# Patient Record
Sex: Male | Born: 1955 | Race: Black or African American | Hispanic: No | Smoking: Never smoker
Health system: Southern US, Community
[De-identification: ages and names within clinical notes are randomized; demographics above are authoritative.]

## PROBLEM LIST (undated history)

## (undated) DIAGNOSIS — K219 Gastro-esophageal reflux disease without esophagitis: Secondary | ICD-10-CM

## (undated) DIAGNOSIS — I83813 Varicose veins of bilateral lower extremities with pain: Secondary | ICD-10-CM

## (undated) DIAGNOSIS — K5790 Diverticulosis of intestine, part unspecified, without perforation or abscess without bleeding: Secondary | ICD-10-CM

## (undated) DIAGNOSIS — U071 COVID-19: Secondary | ICD-10-CM

## (undated) DIAGNOSIS — M1990 Unspecified osteoarthritis, unspecified site: Secondary | ICD-10-CM

## (undated) DIAGNOSIS — R05 Cough: Secondary | ICD-10-CM

## (undated) DIAGNOSIS — K449 Diaphragmatic hernia without obstruction or gangrene: Secondary | ICD-10-CM

## (undated) DIAGNOSIS — E785 Hyperlipidemia, unspecified: Secondary | ICD-10-CM

## (undated) DIAGNOSIS — K635 Polyp of colon: Secondary | ICD-10-CM

## (undated) DIAGNOSIS — D649 Anemia, unspecified: Secondary | ICD-10-CM

## (undated) DIAGNOSIS — G4730 Sleep apnea, unspecified: Secondary | ICD-10-CM

## (undated) DIAGNOSIS — I10 Essential (primary) hypertension: Secondary | ICD-10-CM

## (undated) DIAGNOSIS — T7840XA Allergy, unspecified, initial encounter: Secondary | ICD-10-CM

## (undated) DIAGNOSIS — J45909 Unspecified asthma, uncomplicated: Secondary | ICD-10-CM

## (undated) DIAGNOSIS — R12 Heartburn: Secondary | ICD-10-CM

## (undated) DIAGNOSIS — J302 Other seasonal allergic rhinitis: Secondary | ICD-10-CM

## (undated) DIAGNOSIS — J4520 Mild intermittent asthma, uncomplicated: Secondary | ICD-10-CM

## (undated) DIAGNOSIS — G4750 Parasomnia, unspecified: Secondary | ICD-10-CM

## (undated) HISTORY — DX: Hyperlipidemia, unspecified: E78.5

## (undated) HISTORY — PX: POLYPECTOMY: SHX149

## (undated) HISTORY — DX: Sleep apnea, unspecified: G47.30

## (undated) HISTORY — DX: Varicose veins of bilateral lower extremities with pain: I83.813

## (undated) HISTORY — DX: Allergy, unspecified, initial encounter: T78.40XA

## (undated) HISTORY — DX: Parasomnia, unspecified: G47.50

## (undated) HISTORY — DX: Diaphragmatic hernia without obstruction or gangrene: K44.9

## (undated) HISTORY — DX: Diverticulosis of intestine, part unspecified, without perforation or abscess without bleeding: K57.90

## (undated) HISTORY — PX: ABDOMINAL SURGERY: SHX537

## (undated) HISTORY — DX: Gastro-esophageal reflux disease without esophagitis: K21.9

## (undated) HISTORY — DX: Polyp of colon: K63.5

## (undated) HISTORY — DX: Other seasonal allergic rhinitis: J30.2

## (undated) HISTORY — DX: Heartburn: R12

## (undated) HISTORY — DX: Anemia, unspecified: D64.9

## (undated) HISTORY — DX: Essential (primary) hypertension: I10

## (undated) HISTORY — DX: Mild intermittent asthma, uncomplicated: J45.20

## (undated) HISTORY — DX: Unspecified asthma, uncomplicated: J45.909

---
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
## 1999-12-23 ENCOUNTER — Emergency Department (HOSPITAL_COMMUNITY): Admission: EM | Admit: 1999-12-23 | Discharge: 1999-12-24 | Payer: Self-pay | Admitting: Emergency Medicine

## 1999-12-24 ENCOUNTER — Encounter: Payer: Self-pay | Admitting: Emergency Medicine

## 2000-01-01 ENCOUNTER — Emergency Department (HOSPITAL_COMMUNITY): Admission: EM | Admit: 2000-01-01 | Discharge: 2000-01-01 | Payer: Self-pay | Admitting: Emergency Medicine

## 2000-07-09 ENCOUNTER — Emergency Department (HOSPITAL_COMMUNITY): Admission: EM | Admit: 2000-07-09 | Discharge: 2000-07-09 | Payer: Self-pay | Admitting: Emergency Medicine

## 2000-09-02 ENCOUNTER — Encounter: Admission: RE | Admit: 2000-09-02 | Discharge: 2000-09-02 | Payer: Self-pay | Admitting: Internal Medicine

## 2000-09-09 ENCOUNTER — Encounter: Admission: RE | Admit: 2000-09-09 | Discharge: 2000-09-09 | Payer: Self-pay | Admitting: Internal Medicine

## 2000-09-20 ENCOUNTER — Encounter: Payer: Self-pay | Admitting: Emergency Medicine

## 2000-09-20 ENCOUNTER — Emergency Department (HOSPITAL_COMMUNITY): Admission: EM | Admit: 2000-09-20 | Discharge: 2000-09-20 | Payer: Self-pay | Admitting: Emergency Medicine

## 2000-09-20 ENCOUNTER — Ambulatory Visit (HOSPITAL_COMMUNITY): Admission: RE | Admit: 2000-09-20 | Discharge: 2000-09-20 | Payer: Self-pay | Admitting: Emergency Medicine

## 2001-03-30 ENCOUNTER — Emergency Department (HOSPITAL_COMMUNITY): Admission: EM | Admit: 2001-03-30 | Discharge: 2001-03-30 | Payer: Self-pay | Admitting: Emergency Medicine

## 2001-03-31 ENCOUNTER — Emergency Department (HOSPITAL_COMMUNITY): Admission: EM | Admit: 2001-03-31 | Discharge: 2001-04-01 | Payer: Self-pay | Admitting: Emergency Medicine

## 2001-04-01 ENCOUNTER — Emergency Department (HOSPITAL_COMMUNITY): Admission: EM | Admit: 2001-04-01 | Discharge: 2001-04-01 | Payer: Self-pay

## 2001-04-02 ENCOUNTER — Emergency Department (HOSPITAL_COMMUNITY): Admission: EM | Admit: 2001-04-02 | Discharge: 2001-04-02 | Payer: Self-pay | Admitting: Emergency Medicine

## 2001-04-07 ENCOUNTER — Emergency Department (HOSPITAL_COMMUNITY): Admission: EM | Admit: 2001-04-07 | Discharge: 2001-04-07 | Payer: Self-pay | Admitting: Emergency Medicine

## 2003-05-01 ENCOUNTER — Emergency Department (HOSPITAL_COMMUNITY): Admission: EM | Admit: 2003-05-01 | Discharge: 2003-05-01 | Payer: Self-pay | Admitting: Emergency Medicine

## 2005-06-08 ENCOUNTER — Emergency Department (HOSPITAL_COMMUNITY): Admission: EM | Admit: 2005-06-08 | Discharge: 2005-06-08 | Payer: Self-pay | Admitting: Emergency Medicine

## 2006-09-25 ENCOUNTER — Emergency Department (HOSPITAL_COMMUNITY): Admission: EM | Admit: 2006-09-25 | Discharge: 2006-09-26 | Payer: Self-pay | Admitting: Emergency Medicine

## 2006-09-26 ENCOUNTER — Ambulatory Visit: Payer: Self-pay | Admitting: Vascular Surgery

## 2006-09-26 ENCOUNTER — Ambulatory Visit (HOSPITAL_COMMUNITY): Admission: RE | Admit: 2006-09-26 | Discharge: 2006-09-26 | Payer: Self-pay | Admitting: Emergency Medicine

## 2006-09-26 ENCOUNTER — Encounter (INDEPENDENT_AMBULATORY_CARE_PROVIDER_SITE_OTHER): Payer: Self-pay | Admitting: Emergency Medicine

## 2007-01-09 ENCOUNTER — Emergency Department (HOSPITAL_COMMUNITY): Admission: EM | Admit: 2007-01-09 | Discharge: 2007-01-09 | Payer: Self-pay | Admitting: Emergency Medicine

## 2007-10-14 ENCOUNTER — Emergency Department (HOSPITAL_COMMUNITY): Admission: EM | Admit: 2007-10-14 | Discharge: 2007-10-14 | Payer: Self-pay | Admitting: Emergency Medicine

## 2008-04-25 ENCOUNTER — Emergency Department (HOSPITAL_COMMUNITY): Admission: EM | Admit: 2008-04-25 | Discharge: 2008-04-25 | Payer: Self-pay | Admitting: Emergency Medicine

## 2008-06-24 ENCOUNTER — Encounter (INDEPENDENT_AMBULATORY_CARE_PROVIDER_SITE_OTHER): Payer: Self-pay | Admitting: Emergency Medicine

## 2008-06-24 ENCOUNTER — Ambulatory Visit: Payer: Self-pay | Admitting: Surgery

## 2008-06-24 ENCOUNTER — Emergency Department (HOSPITAL_COMMUNITY): Admission: EM | Admit: 2008-06-24 | Discharge: 2008-06-24 | Payer: Self-pay | Admitting: Emergency Medicine

## 2008-11-08 ENCOUNTER — Ambulatory Visit: Payer: Self-pay | Admitting: Vascular Surgery

## 2008-11-10 ENCOUNTER — Emergency Department (HOSPITAL_COMMUNITY): Admission: EM | Admit: 2008-11-10 | Discharge: 2008-11-10 | Payer: Self-pay | Admitting: Family Medicine

## 2009-03-29 ENCOUNTER — Emergency Department (HOSPITAL_COMMUNITY): Admission: EM | Admit: 2009-03-29 | Discharge: 2009-03-29 | Payer: Self-pay | Admitting: Emergency Medicine

## 2009-06-23 ENCOUNTER — Emergency Department (HOSPITAL_COMMUNITY): Admission: EM | Admit: 2009-06-23 | Discharge: 2009-06-23 | Payer: Self-pay | Admitting: Family Medicine

## 2009-07-05 ENCOUNTER — Ambulatory Visit: Payer: Self-pay | Admitting: Internal Medicine

## 2009-07-05 DIAGNOSIS — E785 Hyperlipidemia, unspecified: Secondary | ICD-10-CM

## 2009-07-05 DIAGNOSIS — I8390 Asymptomatic varicose veins of unspecified lower extremity: Secondary | ICD-10-CM

## 2009-07-05 DIAGNOSIS — I10 Essential (primary) hypertension: Secondary | ICD-10-CM

## 2009-07-05 LAB — CONVERTED CEMR LAB
ALT: 77 units/L — ABNORMAL HIGH (ref 0–53)
Albumin: 4.4 g/dL (ref 3.5–5.2)
Chloride: 108 meq/L (ref 96–112)
Cholesterol: 228 mg/dL — ABNORMAL HIGH (ref 0–200)
HDL: 44 mg/dL (ref >39)
LDL Cholesterol: 159 mg/dL — ABNORMAL HIGH (ref 0–99)
Potassium: 4 meq/L (ref 3.5–5.3)
Sodium: 143 meq/L (ref 135–145)
Total CHOL/HDL Ratio: 5.2
Total Protein: 7.5 g/dL (ref 6.0–8.3)
Triglycerides: 125 mg/dL (ref <150)
VLDL: 25 mg/dL (ref 0–40)

## 2009-08-04 ENCOUNTER — Ambulatory Visit: Payer: Self-pay | Admitting: Internal Medicine

## 2009-08-04 ENCOUNTER — Telehealth: Payer: Self-pay | Admitting: *Deleted

## 2009-08-04 LAB — CONVERTED CEMR LAB
BUN: 18 mg/dL (ref 6–23)
Chloride: 105 meq/L (ref 96–112)
Potassium: 3.9 meq/L (ref 3.5–5.3)
Sodium: 139 meq/L (ref 135–145)

## 2009-08-29 ENCOUNTER — Ambulatory Visit: Payer: Self-pay | Admitting: Internal Medicine

## 2009-08-29 LAB — CONVERTED CEMR LAB

## 2009-10-03 ENCOUNTER — Ambulatory Visit: Payer: Self-pay | Admitting: Vascular Surgery

## 2009-10-17 ENCOUNTER — Telehealth: Payer: Self-pay | Admitting: Internal Medicine

## 2009-10-18 ENCOUNTER — Encounter: Payer: Self-pay | Admitting: Licensed Clinical Social Worker

## 2009-10-18 ENCOUNTER — Ambulatory Visit: Payer: Self-pay | Admitting: Internal Medicine

## 2009-10-18 DIAGNOSIS — K920 Hematemesis: Secondary | ICD-10-CM | POA: Insufficient documentation

## 2009-10-18 LAB — CONVERTED CEMR LAB
Eosinophils Relative: 3 % (ref 0–5)
HCT: 40.4 % (ref 39.0–52.0)
Hemoglobin: 13.5 g/dL (ref 13.0–17.0)
Lymphocytes Relative: 37 % (ref 12–46)
Lymphs Abs: 2 10*3/uL (ref 0.7–4.0)
Monocytes Relative: 10 % (ref 3–12)
Platelets: 222 10*3/uL (ref 150–400)
RBC: 4.47 M/uL (ref 4.22–5.81)
WBC: 5.4 10*3/uL (ref 4.0–10.5)

## 2009-11-26 ENCOUNTER — Emergency Department (HOSPITAL_COMMUNITY): Admission: EM | Admit: 2009-11-26 | Discharge: 2009-11-27 | Payer: Self-pay | Admitting: Emergency Medicine

## 2009-12-14 ENCOUNTER — Telehealth: Payer: Self-pay | Admitting: Internal Medicine

## 2010-04-25 NOTE — Progress Notes (Signed)
Summary: Leg cramping  Phone Note Call from Patient   Complaint: Abdominal Pain, Urinary/GYN Problems Summary of Call: Pt here  complains of leg cramping.  Did a lot of sweating yesterday and leg cramping last cramping last night.  Wife applied mustard and that helped it to stop.  Had been on feet a  lot yesterday.  No swelling in his legs  But has had some swelling before.  Occassionaly will cramp in both legs.  Drank just a little bit of water yesterday.  No chest pains.  Has an appointment scheduled for next month.  Wants something for the cramping.  Dr. Coralee Pesa consulted pt to have BMET today. Angelina Ok RN  Aug 04, 2009 9:28 AM   Initial call taken by: Angelina Ok RN,  Aug 04, 2009 9:25 AM  Follow-up for Phone Call        Can you let him know his electrolytes look fine. He can supplement his diet with pottassium rich foods-o.j., bananas. Sometimes that helps even with a normal potassium. Thanks Follow-up by: Zoila Shutter MD,  Aug 05, 2009 12:45 PM  Additional Follow-up for Phone Call Additional follow up Details #1::        Call to pt.  Message left with pt's girlfriend.  Said that pt was out.  Message left to tell pt that his labs were ok and to eat bananas, drink orange juice and to eat other Calcium rich foods. Additional Follow-up by: Angelina Ok RN,  Aug 11, 2009 3:43 PM     Process Orders Check Orders Results:     [facility]: ABN not required for this insurance Tests Sent for requisitioning (Aug 11, 2009 3:39 PM):     08/04/2009: [facility] -- T-Basic Metabolic Panel [id number] (signed)

## 2010-04-25 NOTE — Assessment & Plan Note (Signed)
Summary: varicose veins bil legs, trouble amb,standing/pcp-magick/hla   Vital Signs:  Patient profile:   55 year old male Height:      68.75 inches (174.63 cm) Weight:      213.5 pounds (97.05 kg) BMI:     31.87 Temp:     97.0 degrees F (36.11 degrees C) oral Pulse rate:   78 / minute BP sitting:   127 / 81  (right arm) Cuff size:   large  Vitals Entered By: Cynda Familia Duncan Dull) (October 18, 2009 9:33 AM) CC: pt c/o right leg pain 2/2 varicose veins Is Patient Diabetic? No Pain Assessment Patient in pain? yes     Location: right leg pain Intensity: 10 Type: sharp Onset of pain  off and on x 1 year Nutritional Status BMI of > 30 = obese  Have you ever been in a relationship where you felt threatened, hurt or afraid?No   Does patient need assistance? Functional Status Self care Ambulation Normal   Primary Care Provider:  Mliss Sax MD  CC:  pt c/o right leg pain 2/2 varicose veins.  History of Present Illness: Patient is a 55 year old man who presents today because of pain in his legs. He has had this pain for the last 2 years and it is secondary to varicose veins in his legs.  He was evaluated by Dr. Josephina Gip of the [facility] and was found to be a candidate for laser ablation.  He states that he can not afford this at this time and is working on getting Medicaid to help defray the costs.  He has seen [facility] and has his orange card but they would still charge him a little over 2000 dollars to have the surgery done.  The pain is worse when standing and gets worse as he walks.  He has to stop and rest which helps the pain.  He denies fever, chills, nausea, vomiting, cold feet, or black spots on his toes.  He does have leg swelling on and off.  He has not been wearing his compression stockings.  He also states that a month or two ago he vomited blood on two occasions.   Since that time he has had no recurrence of the vomiting but does have some blood when  he spits.  He also states that he has been short of breath when he tries to exert himself.  He has also been dizzy occasionally especially when he stands.  Preventive Screening-Counseling & Management  Alcohol-Tobacco     Alcohol drinks/day: 2     Alcohol type: beer     Alcohol Counseling: to decrease amount and/or frequency of alcohol intake     Feels need to cut down: no     Feels annoyed by complaints: no     Feels guilty re: drinking: no     Needs 'eye opener' in am: no     Smoking Status: never  Current Medications (verified): 1)  Atenolol 50 Mg Tabs (Atenolol) .... Take 1 Tablet By Mouth Once A Day 2)  Prilosec 20 Mg Cpdr (Omeprazole) .... Take 1 Tablet By Mouth Two Times A Day 3)  Aspirin 81 Mg Tbec (Aspirin) .... Take 1 Tablet By Mouth Once A Day  Allergies (verified): No Known Drug Allergies  Past History:  Past medical, surgical, family and social histories (including risk factors) reviewed for relevance to current acute and chronic problems.  Past Medical History: Reviewed history from 07/05/2009 and no changes required.  Hypertension  Past Surgical History: Reviewed history from 07/05/2009 and no changes required. none  Family History: Reviewed history from 07/05/2009 and no changes required. mom died at age 55 from cancer but patient does not know the details; father is still living with diabetes and no known heart problems  Social History: Reviewed history from 07/05/2009 and no changes required. work for Scientist, research (medical), [employer]	[place]. lives with girlfriend, not smoking Did not complete 10th grade.  States that he can not read or write except for his name.   Review of Systems      See HPI  Physical Exam  Additional Exam:  General: Well developed, male in no acute distress Head: Atraumatic, normocephalic with no signs of trauma  Eyes: PERRLA, EOM intact  Ears: TM intact   Nose: Nares patent, mucosa is pink and moist, no polyps noted  Mouth: Mucosa is  pink and moist, dention,   Neck: Supple, full ROM, no thyromegaly or masses noted  Resp: Clear to ascultation bilaterally, no wheezes, rales, or rhonchi noted  CV: Regular rate and rhythm with no murmurs, rubs, or gallops noted  Abdomen: Soft, obese, non-distended with normal bowel sounds, There is mild tenderness to palpation in the RLQ. no rebound tenderness.  Musculoskelatal: ROM full with intact strength, no pain to palpation  Neurologic: Alert and oriented x3, Cranial nerves II-XII grossly intact, DTR normal and symmetric  Pulses: Radial, brachial, carotid, femoral pulses equal and symmetric.  dorsal pedis, and posterior tibial pulses are +1 and symmetric. Extermities:There is a large varicosity (3in. by 3 in. by 2in.)  on the distal medial portion of the right knee.  It is compressible but painful and warm to the touch.  There are scattered varicosities both anteriorly and posteriorly on the lower leg to the ankle.  PT and DP are both palpable on the right.  There are no non-compressible areas and moderate tenderness over the varicosities.  On the left there is a varicosity noted on the anterior tibia that is compressible and non-tender, non-erythematous. Rectal: Sphincter tone is normal, there are no masses or hemrrhoids noted.  Prostate is symmetric and smooth.   Impression & Recommendations:  Problem # 1:  VARICOSE VEINS, LOWER EXTREMITIES (ICD-454.9) I reviewed the note from Dr. Hart Rochester from [facility] and he states that given the extent of his disease that he would be a candidate for laser ablation therapy.  We contacted Dr. Candie Chroman office and he states that with the patient's orange card that he would still be responsible for 55% of the cost which is approximately 2200 dollars which the patient can not afford.  I contacted Dorothe Pea, LSW who talked to the patient and his daughter about options for starting Medicaid. See her note for the full content.  She will work  with him to get the Medicaid application through and we will persue the surgery when that is in place.  in the mean time he is encouraged to use the compression stockings as they will give him the best relief.  He will also use Tylenol 500mg  1-2 tabs every 4-6hours for the pain.  He is also encouraged to keep his legs elevated.  Orders: Social Work Referral (Social )  Problem # 2:  CLAUDICATION (ICD-443.9) This is secondary to #1.  There are no worrisome signs for possible ischemic limb at this time.   Problem # 3:  * ILLITERACY Assessment: Comment Only During the interview with Lupita Leash and while talking with the daughter there  was concerns for his reading ability and how it might interfere with getting the best medical treatment.  When asked directly he states that he can not read and can only write his name.  I'm concerned that this will make it hard for him to get the care that he needs or be able to remember the recommendations.  Lupita Leash will work with him on getting help and will work on his behalf to help with the Medicaid/Disability paperwork done.  Orders: Social Work Referral (Social )  Problem # 4:  HEMOPTYSIS UNSPECIFIED (ICD-786.30) He states that he was vomiting blood.  Guaic testing was negative today which helps rule out active bleeding but i'm concerned that he is anemic from bleeding.  We will order a GI referral for possible EGD/Colonoscopy per their recommendations.  We will also check a CBC today and start him on Iron.    Orders: Gastroenterology Referral (GI) T-CBC w/Diff ([id number])  Complete Medication List: 1)  Atenolol 50 Mg Tabs (Atenolol) .... Take 1 tablet by mouth once a day 2)  Prilosec 20 Mg Cpdr (Omeprazole) .... Take 1 tablet by mouth two times a day 3)  Aspirin 81 Mg Tbec (Aspirin) .... Take 1 tablet by mouth once a day 4)  Ferrous Sulfate 325 (65 Fe) Mg Tabs (Ferrous sulfate) .... Take 1 tablet by mouth two times a day  Patient Instructions: 1)  Start  Iron Sulfate 1 pill twice a day 2)  Wear your compression stockings.  They will be the best pain relief you can get! 3)  Start Tylenol 500 mg 1-2 tablets every 4-6 hours as needed for the pain. 4)  Continue persuing Medicaid.  Call to talk to Gilford Silvius as needed for assistance.   5)  Make an appointment for GI doctor to follow up on coughing up blood. Prescriptions: FERROUS SULFATE 325 (65 FE) MG TABS (FERROUS SULFATE) Take 1 tablet by mouth two times a day  #60 x 6   Entered and Authorized by:   Leodis Sias MD   Signed by:   Leodis Sias MD on 10/18/2009   Method used:   Faxed to ...       [facility] (retail)       [address] [place], [place]  [address]       Ph: [phone]       Fax: [phone]   RxID:   [id number]  Process Orders Check Orders Results:     [facility]: ABN not required for this insurance Tests Sent for requisitioning (October 19, 2009 12:32 PM):     10/18/2009: [facility] -- T-CBC w/Diff [[id number]] (signed)    Prevention & Chronic Care Immunizations   Influenza vaccine: Not documented   Influenza vaccine deferral: Not indicated  (07/05/2009)    Tetanus booster: Not documented   Td booster deferral: Not indicated  (07/05/2009)    Pneumococcal vaccine: Not documented  Colorectal Screening   Hemoccult: Not documented   Hemoccult action/deferral: Refused  (08/29/2009)    Colonoscopy: Not documented   Colonoscopy action/deferral: Refused  (08/29/2009)  Other Screening   PSA: Not documented   PSA action/deferral: Discussed-decision deferred  (08/29/2009)   Smoking status: never  (10/18/2009)  Lipids   Total Cholesterol: 228  (07/05/2009)   LDL: 159  (07/05/2009)   LDL Direct: Not documented   HDL: 44  (07/05/2009)   Triglycerides: 125  (07/05/2009)    SGOT (AST): 41  (07/05/2009)  BMP action: Ordered   SGPT (ALT): 77  (07/05/2009)   Alkaline  phosphatase: 85  (07/05/2009)   Total bilirubin: 0.3  (07/05/2009)    Lipid flowsheet reviewed?: Yes   Progress toward LDL goal: Unchanged  Hypertension   Last Blood Pressure: 127 / 81  (10/18/2009)   Serum creatinine: 0.99  (08/04/2009)   Serum potassium 3.9  (08/04/2009)    Hypertension flowsheet reviewed?: Yes   Progress toward BP goal: At goal  Self-Management Support :   Personal Goals (by the next clinic visit) :      Personal blood pressure goal: 130/80  (08/29/2009)     Personal LDL goal: 100  (08/29/2009)    Patient will work on the following items until the next clinic visit to reach self-care goals:     Medications and monitoring: take my medicines every day  (10/18/2009)     Eating: eat foods that are low in salt, eat baked foods instead of fried foods  (10/18/2009)     Activity: take a 30 minute walk every day, take the stairs instead of the elevator, park at the far end of the parking lot  (08/29/2009)    Hypertension self-management support: Pre-printed educational material, Resources for patients handout  (10/18/2009)    Lipid self-management support: Pre-printed educational material, Resources for patients handout  (10/18/2009)        Resource handout printed.

## 2010-04-25 NOTE — Assessment & Plan Note (Signed)
Summary: Social Work   Social Work Evaluation Date  10/18/2009 Patient name [name] [name] [facility]  Social Worker's name : Dorothe Pea MSW- LCSW  Home [phone]  Work phone: [phone]  Cell phone: .  Marland Kitchen     Alternate phone: . Marland Kitchen       Individual making referral: Dr. Tonny Branch  Primary Reason for Referral:     Assist with Disability Process or Referral to Voc. Rehab Comments This is a 55 yo AA male who has severe swollen varicosities more prominently i n his right leg and also in his left leg.   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  He has been certified by [facility] [facility] to receive our discount.  We cannot obtain specialist care (vascular surgeeon, vein specialist) without Medicaid/Disability.  He is not engaged in gainful substantial employment.   Foodstamps are $16 per month.  He lives with his girlfriend.   The patient admits that he cannot read or write except for his name.  He sporadically attended school in [employer] up to the 10th grade.  He reports having to take care of sick grandparents in [place] and hardly going to school.  Action taken by Social Work: Met with patient and dgtr in exam room. The patient is a pleasant gentleman and shows me unsightly bulging varicosities specifically on his right leg. His dgtr [name] [name] is here today and can be reached at [phone].  She lives out of state and plans to move back here to [place].  The patient's girlfriend is also here today but she was not in exam room at the time of assessment.   I urged patient and daughter to go to [facility] office asap and apply based on the state of his legs.  I've suggested they also file a disability claim by phone and also connect with Voc Rehab who may be able to assess and assist with treatment of his veins. Soc. Work will follow with patient and family for ongoing needs.  I did also mention to patient that there is a program here in [place] called [facility] [employer] that we  can refer him to in the future for reading assessment and classes. Right now he has more than enough to deal with concerning his health, disability and Medicaid.

## 2010-04-25 NOTE — Assessment & Plan Note (Signed)
Summary: EST-2 WEEK RECHECK/CH   Vital Signs:  Patient profile:   55 year old male Height:      68.75 inches (174.63 cm) Weight:      211.5 pounds (96.14 kg) BMI:     31.57 Temp:     97.6 degrees F (36.44 degrees C) oral Pulse rate:   76 / minute BP sitting:   126 / 91  (right arm)  Vitals Entered By: Stanton Kidney Ditzler RN (August 29, 2009 10:34 AM) Is Patient Diabetic? No Pain Assessment Patient in pain? no      Nutritional Status BMI of 25 - 29 = overweight Nutritional Status Detail appetite good  Have you ever been in a relationship where you felt threatened, hurt or afraid?denies   Does patient need assistance? Functional Status Self care Ambulation Normal Comments FU - occ discomfort right leg past year. No energy some days.   History of Present Illness: 55 yo male with PMH outlined below presents to [facility] for regular follow up appointment. He has no concerns at the time. No recent sicknesses or hospitalizaitons. No episodes of chest pain, SOB, palpitations. No specific abdominal or urinary concerns. No recent changes in appetite, weight, sleep patterns, mood. 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 He would like for me to call them again today to find out what is going on.    Depression History:      The patient denies a depressed mood most of the day and a diminished interest in his usual daily activities.  The patient denies significant weight loss, significant weight gain, insomnia, hypersomnia, psychomotor agitation, psychomotor retardation, fatigue (loss of energy), feelings of worthlessness (guilt), impaired concentration (indecisiveness), and recurrent thoughts of death or suicide.        The patient denies that he feels like life is not worth living, denies that he wishes that he were dead, and denies that he has thought about ending his life.         Preventive  Screening-Counseling & Management  Alcohol-Tobacco     Alcohol drinks/day: 2     Alcohol type: beer     Alcohol Counseling: to decrease amount and/or frequency of alcohol intake     Feels need to cut down: no     Feels annoyed by complaints: no     Feels guilty re: drinking: no     Needs 'eye opener' in am: no     Smoking Status: never  Caffeine-Diet-Exercise     Caffeine use/day: never  Problems Prior to Update: 1)  Hyperlipidemia  (ICD-272.4) 2)  Diabetes Mellitus  (ICD-250.00) 3)  Varicose Veins, Lower Extremities  (ICD-454.9) 4)  Hypertension  (ICD-401.9)  Medications Prior to Update: 1)  Atenolol 50 Mg Tabs (Atenolol) .... Take 1 Tablet By Mouth Once A Day  Current Medications (verified): 1)  Atenolol 50 Mg Tabs (Atenolol) .... 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 lives with girlfriend, not smoking  Risk Factors: Alcohol Use: 2 (08/29/2009) Caffeine  Use: never (08/29/2009)  Risk Factors: Smoking Status: never (08/29/2009)  Family History: Reviewed history from 07/05/2009 and no changes required. mom died at age 109 from cancer but patient does not know the details; father is still living with diabetes and no known heart problems  Social History: Reviewed history from 07/05/2009 and no changes required. 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 Lungs are clear to auscultation, no crackles or wheezes. Heart:  Normal rate and regular rhythm. S1 and S2 normal without gallop, murmur, click, rub or other extra sounds. Abdomen:  Bowel sounds positive,abdomen soft and non-tender without masses, organomegaly or hernias noted. Msk:  normal ROM, no joint tenderness, no joint swelling, no joint warmth, no redness over joints, no joint deformities, no joint instability, and no crepitation.   Extremities:  multiple varicose vein, left LE worse than right, slightly tender to palpation Psych:  Cognition and judgment appear intact. Alert and cooperative with normal attention span and concentration. No apparent delusions, illusions, hallucinations   Impression & Recommendations:  Problem # 1:  HYPERLIPIDEMIA (ICD-272.4) No treatment except diet and exercise recommended.  Labs Reviewed: SGOT: 41 (07/05/2009)   SGPT: 77 (07/05/2009)   HDL:44 (07/05/2009)  LDL:159 (07/05/2009)  Chol:228 (07/05/2009)  Trig:125 (07/05/2009)  Problem # 2:  DIABETES MELLITUS (ICD-250.00) Pt is not diabetic and will therefore remove this from the list.   Problem # 3:  HYPERTENSION (ICD-401.9) Improved, cont to monitor, improved from last visit. If lower BP stays elevated may increase the dose.  His updated medication list for this problem includes:    Atenolol 50 Mg Tabs (Atenolol) .Marland Kitchen... Take 1 tablet by mouth once a day  BP today: 126/91 Prior BP: 143/95 (07/05/2009)  Labs Reviewed: K+: 3.9 (08/04/2009) Creat: : 0.99 (08/04/2009)   Chol: 228 (07/05/2009)   HDL: 44 (07/05/2009)   LDL: 159 (07/05/2009)   TG: 125 (07/05/2009)  Problem # 4:  VARICOSE VEINS, LOWER EXTREMITIES (ICD-454.9) Call vascular today.   Complete Medication List: 1)  Atenolol 50 Mg Tabs (Atenolol) .... Take 1 tablet by mouth once a day 2)  Prilosec 20 Mg Cpdr (Omeprazole) .... Take 1 tablet by mouth two  times a day 3)  Baclofen 10 Mg Tabs (Baclofen) .... Take 1 tablet twice daily as needed for muscle spasms  Patient Instructions: 1)  Please schedule a follow-up appointment in 6 months. 2)  Please check your blood pressure regularly, if it is >170 please call clinic at [phone] Prescriptions: BACLOFEN 10 MG TABS (BACLOFEN) take 1 tablet twice daily as needed for muscle spasms  #60 x 5   Entered and Authorized by:   Mliss Sax MD   Signed by:   Mliss Sax MD on 08/29/2009   Method used:   Print then Give to Patient   RxID:   [id number] PRILOSEC 20 MG CPDR (OMEPRAZOLE) Take 1 tablet by mouth two times a day  #60 x 11   Entered and Authorized by:   Mliss Sax MD   Signed by:   Mliss Sax MD on 08/29/2009   Method used:   Print then Give to Patient   RxID:   [id number] ATENOLOL 50 MG TABS (ATENOLOL) Take 1 tablet by mouth once a day  #30 x 11   Entered and Authorized  by:   Mliss Sax MD   Signed by:   Mliss Sax MD on 08/29/2009   Method used:   Print then Give to Patient   RxID:   [id number]    Prevention & Chronic Care Immunizations   Influenza vaccine: Not documented   Influenza vaccine deferral: Not indicated  (07/05/2009)    Tetanus booster: Not documented   Td booster deferral: Not indicated  (07/05/2009)    Pneumococcal vaccine: Not documented  Colorectal Screening   Hemoccult: Not documented   Hemoccult action/deferral: Refused  (08/29/2009)    Colonoscopy: Not documented   Colonoscopy action/deferral: Refused  (08/29/2009)  Other Screening   PSA: Not documented   PSA action/deferral: Discussed-decision deferred  (08/29/2009)   Smoking status: never  (08/29/2009)  Diabetes Mellitus   HgbA1C: 5.4  (07/05/2009)    Eye exam: Not documented   Diabetic eye exam action/deferral: Not indicated  (08/29/2009)    Foot exam: Not documented   Foot exam action/deferral: Not indicated   High risk foot: Not documented   Foot care education:  Not documented    Urine microalbumin/creatinine ratio: Not documented   Urine microalbumin action/deferral: Not indicated    Diabetes flowsheet reviewed?: Yes   Progress toward A1C goal: At goal  Lipids   Total Cholesterol: 228  (07/05/2009)   LDL: 159  (07/05/2009)   LDL Direct: Not documented   HDL: 44  (07/05/2009)   Triglycerides: 125  (07/05/2009)    SGOT (AST): 41  (07/05/2009)   BMP action: Ordered   SGPT (ALT): 77  (07/05/2009)   Alkaline phosphatase: 85  (07/05/2009)   Total bilirubin: 0.3  (07/05/2009)    Lipid flowsheet reviewed?: Yes   Progress toward LDL goal: At goal  Hypertension   Last Blood Pressure: 126 / 91  (08/29/2009)   Serum creatinine: 0.99  (08/04/2009)   Serum potassium 3.9  (08/04/2009)    Hypertension flowsheet reviewed?: Yes   Progress toward BP goal: At goal  Self-Management Support :   Personal Goals (by the next clinic visit) :     Personal A1C goal: 7  (08/29/2009)     Personal blood pressure goal: 130/80  (08/29/2009)     Personal LDL goal: 100  (08/29/2009)    Patient will work on the following items until the next clinic visit to reach self-care goals:     Eating: eat more vegetables, use fresh or frozen vegetables, eat foods that are low in salt, eat fruit for snacks and desserts, limit or avoid alcohol  (08/29/2009)     Activity: take a 30 minute walk every day, take the stairs instead of the elevator, park at the far end of the parking lot  (08/29/2009)    Diabetes self-management support: Written self-care plan, Education handout, Resources for patients handout  (08/29/2009)   Diabetes care plan printed   Diabetes education handout printed    Hypertension self-management support: Written self-care plan, Education handout, Resources for patients handout  (08/29/2009)   Hypertension self-care plan printed.   Hypertension education handout printed    Lipid self-management support: Written self-care plan, Education handout,  Resources for patients handout  (08/29/2009)   Lipid self-care plan printed.   Lipid education handout printed      Resource handout printed.

## 2010-04-25 NOTE — Progress Notes (Signed)
Summary: Refill  Phone Note Refill Request Message from:  Patient on December 14, 2009 3:54 PM  Refills Requested: Medication #1:  ATENOLOL 50 MG TABS Take 1 tablet by mouth once a day Pt said that he is out.  Last visit was 09/2009.  Last labs were 07/05/2009.   Method Requested: Electronic Initial call taken by: Angelina Ok, RN,  December 14, 2009 3:54 PM  Follow-up for Phone Call        completed refill, thank you Iskra  Follow-up by: Mliss Sax MD,  December 15, 2009 12:43 AM    Prescriptions: ATENOLOL 50 MG TABS (ATENOLOL) Take 1 tablet by mouth once a day  #30 x 11   Entered by:   Mliss Sax MD   Authorized by:   Marland Kitchen [facility] ATTENDING DESKTOP   Signed by:   Mliss Sax MD on 12/15/2009   Method used:   Faxed to ...       [facility] (retail)       [address] [place], [place]  [address]       Ph: [phone]       Fax: [phone]   RxID:   [id number]

## 2010-04-25 NOTE — Progress Notes (Signed)
Summary: appt/ hla  Phone Note Call from Patient   Summary of Call: pt's daughter calls c/o that pt is having difficulty w/ amb, standing due to varicose veins, states in pain all the time, nothing helps, standing and amb makes worse. ongoing x 1 1/2 yrs. appt given 7/26 dr Tonny Branch. Initial call taken by: Marin Roberts RN,  October 17, 2009 3:01 PM  Follow-up for Phone Call        Appt Scheduled will see tomorrow. Follow-up by: Leodis Sias MD,  October 17, 2009 3:33 PM

## 2010-04-25 NOTE — Assessment & Plan Note (Signed)
Summary: NEW TO MD AND CLINIC WANTS TO EST CARE/CH   Vital Signs:  Patient profile:   55 year old male Height:      68.75 inches Weight:      216.8 pounds (98.55 kg) BMI:     32.37 Temp:     97.5 degrees F (36.39 degrees C) oral Pulse rate:   96 / minute BP sitting:   143 / 95  (left arm)  Vitals Entered By: Stanton Kidney Ditzler RN (Aug 02, 2009 11:05 AM) Is Patient Diabetic? No Pain Assessment Patient in pain? no      Nutritional Status BMI of > 30 = obese Nutritional Status Detail appetite good  Have you ever been in a relationship where you felt threatened, hurt or afraid?denies   Does patient need assistance? Functional Status Self care Ambulation Normal Comments Ck- up and discuss wt. Discomfort both knees - years. Refill on meds.   History of Present Illness: 55 yo male with history of HTN and no other known medical problems presents to [facility]. This is his first clinic appointment and wants to establish himself here. He has no problems today, no concerns.  No recent sicknesses or hospitalizaitons. No episodes of chest pain, SOB, palpitations. No specific abdominal or urinary concerns. No recent changes in appetite, weight, sleep patterns, mood. No depressions, no history of recent traveling, no sick exposures or contact. Take 3 medicines for BP but does not know the names.One is PCN for teeth and others are for BP.   Has chronic varicose veins, left leg worse than right one and has been getting worse over the past 2 years.    Depression History:      The patient denies a depressed mood most of the day and a diminished interest in his usual daily activities.  The patient denies significant weight loss, significant weight gain, insomnia, hypersomnia, psychomotor agitation, psychomotor retardation, fatigue (loss of energy), feelings of worthlessness (guilt), impaired concentration (indecisiveness), and recurrent thoughts of death or suicide.        The patient denies that he feels  like life is not worth living, denies that he wishes that he were dead, and denies that he has thought about ending his life.         Preventive Screening-Counseling & Management  Alcohol-Tobacco     Alcohol drinks/day: 2     Alcohol type: beer     Alcohol Counseling: to decrease amount and/or frequency of alcohol intake     Feels need to cut down: no     Feels annoyed by complaints: no     Feels guilty re: drinking: no     Needs 'eye opener' in am: no     Smoking Status: never  Caffeine-Diet-Exercise     Caffeine use/day: never  Problems Prior to Update: None  Medications Prior to Update: 1)  None  Current Medications (verified): 1)  Atenolol 50 Mg Tabs (Atenolol) .... 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 lives with girlfriend, not smoking  Risk Factors: Alcohol Use: 2 (08/02/09) Caffeine Use: never (2009-08-02)  Risk Factors: Smoking Status: never (08-02-09)  Past Medical History: Hypertension  Past Surgical History: none  Family  History: Reviewed history and no changes required. mom died at age 82 from cancer but patient does not know the details; father is still living with diabetes and no known heart problems  Social History: Reviewed history and no changes required. 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 Lungs are  clear to auscultation, no crackles or wheezes. Heart:  Normal rate and regular rhythm. S1 and S2 normal without gallop, murmur, click, rub or other extra sounds. Abdomen:  Bowel sounds positive,abdomen soft and non-tender without masses, organomegaly or hernias noted. Msk:  normal ROM, no joint tenderness, no joint swelling, no joint warmth, no redness over joints, no joint deformities, no joint instability, and no crepitation.   Pulses:  R and L carotid,radial,femoral,dorsalis pedis and posterior tibial pulses are full and equal bilaterally Extremities:  multiple varicose vein, left LE worse than right, slightly tender to palpation Neurologic:  No cranial nerve deficits noted. Station and gait are normal. Plantar reflexes are down-going bilaterally. DTRs are symmetrical throughout. Sensory, motor and coordinative functions appear intact. Inguinal Nodes:  No significant adenopathy Psych:  Cognition and judgment appear intact. Alert and cooperative with normal attention span and concentration. No apparent delusions, illusions, hallucinations   Impression & Recommendations:  Problem # 1:  VARICOSE VEINS, LOWER EXTREMITIES (ICD-454.9) based on my physical exam and discussion with Dr. Aundria Rud pt would most likely benefit from surgery. I have called [facility] [place] [facility] and asked if anyone would be willing to operate on him given the fact he has no insurance. Apparently Dr. Luisa Hart used to do it but does not any longer. Currently I am waiting for [facility] to call me back to let me know if they can do it or not.   Problem # 2:  HYPERTENSION (ICD-401.9)  Pt is currently only on atenolol and will continue the same regimen but since he was out of it for some time and was not taking it, I think it would be reasonable to give him the refill on it and we can see him back in 2 weeks and if it is still high will readjust the regimen.  Orders: T-Basic Metabolic Panel [id number])  BP  today: 143/95  His updated medication list for this problem includes:    Atenolol 50 Mg Tabs (Atenolol) .Marland Kitchen... Take 1 tablet by mouth once a day  Orders: T-Basic Metabolic Panel [id number])  Problem # 3:  HYPERLIPIDEMIA (ICD-272.4)  Willcheck FLP today and will readjust the regimen as indicated.   Orders: T-Lipid Profile ([id number])  Problem # 4:  DIABETES MELLITUS (ICD-250.00)  Pt has no know diabetes but has strong family history of it so will checkA1C and will start the treatment if necessary.   Orders: T-Hgb A1C (in-house) ([id number]) T- Capillary Blood Glucose ([id number])  Complete Medication List: 1)  Atenolol 50 Mg Tabs (Atenolol) .... Take 1 tablet by mouth once a day  Other Orders: T-Hepatic Function [id number])  Patient Instructions: 1)  Please schedule a follow-up appointment in 2 weeks. 2)  Please check your blood pressure regularly, if it is >170 please call clinic at [phone] Prescriptions: ATENOLOL 50 MG TABS (ATENOLOL) Take 1 tablet by mouth once a day  #30 x 3   Entered and Authorized by:  Mliss Sax MD   Signed by:   Mliss Sax MD on 07/05/2009   Method used:   Print then Give to Patient   RxID:   [id number]  Process Orders Check Orders Results:     [facility]: ABN not required for this insurance Tests Sent for requisitioning (July 05, 2009 12:02 PM):     07/05/2009: [facility] -- T-Lipid Profile [id number] (signed)     07/05/2009: [facility] -- T-Basic Metabolic Panel [id number] (signed)     07/05/2009: [facility] -- T-Hepatic Function [id number] (signed)    Prevention & Chronic Care Immunizations   Influenza vaccine: Not documented   Influenza vaccine deferral: Not indicated  (07/05/2009)    Tetanus booster: Not documented   Td booster deferral: Not indicated  (07/05/2009)    Pneumococcal vaccine: Not documented  Colorectal Screening   Hemoccult: Not  documented   Hemoccult action/deferral: Deferred  (07/05/2009)    Colonoscopy: Not documented   Colonoscopy action/deferral: Deferred  (07/05/2009)  Other Screening   PSA: Not documented   PSA action/deferral: Discussion deferred  (07/05/2009)   Smoking status: never  (07/05/2009)  Diabetes Mellitus   HgbA1C: Not documented    Eye exam: Not documented   Diabetic eye exam action/deferral: Deferred  (07/05/2009)    Foot exam: Not documented   Foot exam action/deferral: Deferred   High risk foot: Not documented   Foot care education: Not documented    Urine microalbumin/creatinine ratio: Not documented   Urine microalbumin action/deferral: Deferred    Diabetes flowsheet reviewed?: Yes   Progress toward A1C goal: At goal  Lipids   Total Cholesterol: Not documented   LDL: Not documented   LDL Direct: Not documented   HDL: Not documented   Triglycerides: Not documented    SGOT (AST): Not documented   BMP action: Ordered   SGPT (ALT): Not documented   Alkaline phosphatase: Not documented   Total bilirubin: Not documented    Lipid flowsheet reviewed?: Yes   Progress toward LDL goal: Unchanged  Hypertension   Last Blood Pressure: 143 / 95  (07/05/2009)   Serum creatinine: Not documented   Serum potassium Not documented    Hypertension flowsheet reviewed?: Yes   Progress toward BP goal: Unchanged  Self-Management Support :    Diabetes self-management support: Resources for patients handout, Written self-care plan  (07/05/2009)   Diabetes care plan printed    Hypertension self-management support: Resources for patients handout, Written self-care plan  (07/05/2009)   Hypertension self-care plan printed.    Lipid self-management support: Resources for patients handout, Written self-care plan  (07/05/2009)   Lipid self-care plan printed.      Resource handout printed.  Appended Document: Lab Order/A1c ressults    Lab Visit  Laboratory Results   Blood  Tests   Date/Time Received: July 05, 2009 12:40 PM Date/Time Reported: Alric Quan  July 05, 2009 12:40 PM   HGBA1C: 5.4%   (Normal Range: Non-Diabetic - 3-6%   Control Diabetic - 6-8%) CBG Random:: 85mg /dL    Orders Today:

## 2010-05-01 ENCOUNTER — Other Ambulatory Visit: Payer: Self-pay | Admitting: *Deleted

## 2010-05-01 NOTE — Telephone Encounter (Signed)
Pt c/o leg spasms and would like refill on baclofen 10 mg, 1 bid This seems to work well for him

## 2010-05-02 ENCOUNTER — Other Ambulatory Visit: Payer: Self-pay | Admitting: Internal Medicine

## 2010-05-02 MED ORDER — BACLOFEN 10 MG PO TABS: 10.0000 mg | ORAL_TABLET | Freq: Two times a day (BID) | ORAL | Status: DC

## 2010-05-03 NOTE — Telephone Encounter (Signed)
Med was called into [facility]

## 2010-05-08 ENCOUNTER — Encounter: Payer: Self-pay | Admitting: Internal Medicine

## 2010-05-16 ENCOUNTER — Ambulatory Visit: Payer: Self-pay | Admitting: Internal Medicine

## 2010-05-18 ENCOUNTER — Encounter: Payer: Self-pay | Admitting: Internal Medicine

## 2010-06-13 ENCOUNTER — Encounter: Payer: Self-pay | Admitting: Internal Medicine

## 2010-06-13 ENCOUNTER — Ambulatory Visit (INDEPENDENT_AMBULATORY_CARE_PROVIDER_SITE_OTHER): Payer: Self-pay | Admitting: Internal Medicine

## 2010-06-13 ENCOUNTER — Other Ambulatory Visit: Payer: Self-pay | Admitting: *Deleted

## 2010-06-13 VITALS — BP 143/97 | HR 97 | Temp 99.2°F | Resp 20 | Ht 66.0 in | Wt 225.7 lb

## 2010-06-13 DIAGNOSIS — I8390 Asymptomatic varicose veins of unspecified lower extremity: Secondary | ICD-10-CM

## 2010-06-13 DIAGNOSIS — G4730 Sleep apnea, unspecified: Secondary | ICD-10-CM

## 2010-06-13 MED ORDER — ALBUTEROL SULFATE HFA 108 (90 BASE) MCG/ACT IN AERS: 2.0000 | INHALATION_SPRAY | Freq: Four times a day (QID) | RESPIRATORY_TRACT | Status: DC | PRN

## 2010-06-13 MED ORDER — CETIRIZINE HCL 10 MG PO TABS: 10.0000 mg | ORAL_TABLET | Freq: Every day | ORAL | Status: DC

## 2010-06-13 NOTE — Progress Notes (Signed)
Sleep study orders, referral form, and chart information faxed to Sleep Center. Patient notified that Sleep Center would contact him directly to make appt for the study. Pt verbalized understanding of these instructions.

## 2010-06-13 NOTE — Progress Notes (Signed)
  Subjective:    Patient ID: [name] [name], male    DOB: 11-27-1955, 55 y.o.   MRN: [id number]  HPI Patient a poor historian. Here because his girlfriend says he snores at night, cuts his airway off, wakes, "like that sleep apne thing I've heard about" Notes daytime somnolence as well.Has not fallen asleep while driving, but does frequently get drowsy Has some moderate sinus congestion, secondary coughing, with increased pollen counts over the last 2-3 weeks No tobacco, 2 beers per week max Works as a Programmer, multimedia  Review of Systems     Objective:   Physical Exam    Heent: boggy, pale nasal mucosa OP clear Neck without Thyromegaly Lungs CTA CAR RRR no MRG EXT extensive R>L venous varicosities     Assessment & Plan:   Possible OSA-order Sleep Study without titration phase  SAR: Zyrtec 10 mg qd  Varicosities: Gen Surg referral (difficult for him to work on his feet all day)  RTC: 4 weeks

## 2010-06-14 LAB — GLUCOSE, CAPILLARY: Glucose-Capillary: 85 mg/dL (ref 70–99)

## 2010-07-05 LAB — CBC
HCT: 38.3 % — ABNORMAL LOW (ref 39.0–52.0)
Hemoglobin: 13.1 g/dL (ref 13.0–17.0)
MCV: 92.3 fL (ref 78.0–100.0)
RDW: 13.1 % (ref 11.5–15.5)
WBC: 5.7 10*3/uL (ref 4.0–10.5)

## 2010-07-05 LAB — POCT I-STAT, CHEM 8
BUN: 15 mg/dL (ref 6–23)
Calcium, Ion: 1.17 mmol/L (ref 1.12–1.32)
Chloride: 105 mEq/L (ref 96–112)
Glucose, Bld: 88 mg/dL (ref 70–99)

## 2010-07-05 LAB — DIFFERENTIAL
Eosinophils Relative: 4 % (ref 0–5)
Lymphocytes Relative: 26 % (ref 12–46)
Lymphs Abs: 1.5 10*3/uL (ref 0.7–4.0)
Monocytes Absolute: 0.5 10*3/uL (ref 0.1–1.0)

## 2010-07-10 LAB — CBC
MCHC: 33.1 g/dL (ref 30.0–36.0)
MCV: 92.3 fL (ref 78.0–100.0)
Platelets: 182 10*3/uL (ref 150–400)
RDW: 13.1 % (ref 11.5–15.5)

## 2010-07-10 LAB — DIFFERENTIAL
Basophils Relative: 1 % (ref 0–1)
Eosinophils Absolute: 0.2 10*3/uL (ref 0.0–0.7)
Monocytes Relative: 8 % (ref 3–12)
Neutrophils Relative %: 59 % (ref 43–77)

## 2010-07-10 LAB — BASIC METABOLIC PANEL
BUN: 11 mg/dL (ref 6–23)
CO2: 27 mEq/L (ref 19–32)
Chloride: 108 mEq/L (ref 96–112)
Creatinine, Ser: 0.93 mg/dL (ref 0.4–1.5)
Glucose, Bld: 96 mg/dL (ref 70–99)

## 2010-07-18 ENCOUNTER — Ambulatory Visit (HOSPITAL_BASED_OUTPATIENT_CLINIC_OR_DEPARTMENT_OTHER): Payer: Self-pay

## 2010-08-08 NOTE — Assessment & Plan Note (Signed)
OFFICE VISIT   [name] [name], [name] [name]  DOB:  [birth year]                                       10/03/2009  CHART#:[id number]   The patient was seen and evaluated for bulging varicosities in the right  leg secondary to greater saphenous reflux 1 year ago.  He has continued  to try conservative measures such as long-leg elastic compression  stockings, elevation and analgesics.  He does not state that he is  having pain in the right leg at this time but does have bulging  varicosities which are quite significant.  He has no history of stasis  ulcers, bleeding or DVT or thrombophlebitis.   Venous duplex exam reveals a large right great saphenous vein with gross  reflux from the junction down throughout the entire upper leg down to  the knee feeding these bulging varicosities in the right medial calf.  He has 3+ femoral, popliteal, and dorsalis pedis pulses palpable  bilaterally.   He is a candidate for laser ablation of the right great saphenous vein.  If he should decide he would like to proceed, he will be in touch with  [place] to schedule this.  He would also like to again apply for [employer]  insurance coverage which we will do in the near future.     [name], M.D.  Electronically Signed   JDL/MEDQ  D:  10/03/2009  T:  10/04/2009  Job:  [id number]

## 2010-08-08 NOTE — Procedures (Signed)
LOWER EXTREMITY VENOUS REFLUX EXAM   INDICATION:  Right lower extremity varicose veins with pain and  swelling.   EXAM:  Using color-flow imaging and pulse Doppler spectral analysis, the  right common femoral, superficial femoral, popliteal, posterior tibial,  greater and lesser saphenous veins are evaluated.  There is evidence  suggesting deep venous insufficiency in the right lower extremity.   The right saphenofemoral junction is not competent.  The right GSV is  not competent with the caliber as described below.   The right proximal short saphenous vein demonstrates competency.   GSV Diameter (used if found to be incompetent only)                                            Right    Left  Proximal Greater Saphenous Vein           1.77 cm  cm  Proximal-to-mid-thigh                     cm       cm  Mid thigh                                 0.91 cm  cm  Mid-distal thigh                          cm       cm  Distal thigh                              0.85 cm  cm  Knee                                      0.8 cm   cm   IMPRESSION:  1. Right greater saphenous vein reflux is identified with the caliber      ranging from 0.8 cm to 1.77 cm knee to groin.  2. The right greater saphenous vein is not aneurysmal.  3. The right greater saphenous vein is mildly tortuous in a focal      location on the proximal thigh.  4. The deep venous system is not competent.  5. The right lesser saphenous vein is competent.   ___________________________________________  Quita Skye. Hart Rochester, M.D.   AS/MEDQ  D:  11/08/2008  T:  11/08/2008  Job:  [id number]

## 2010-08-08 NOTE — Consult Note (Signed)
NEW PATIENT CONSULTATION   [name], [name]  DOB:  1955-12-06                                       11/08/2008  CHART#:[id number]   The patient is a 55 year old male who is self-referred for varicosities  in the right leg.  He has had large bulging varicosities in the lower  thigh and medial calf area in the right leg for many years which have  continued to enlarge.  He does have some heavy throbbing and aching  discomfort as the day progresses and does occasionally wear elastic  compression stockings which help with these symptoms.  He does not  elevate his legs on a regular basis nor take pain medication.  He has no  history of deep venous thrombosis, thrombophlebitis, pulmonary emboli,  swelling, bleeding or ulceration.  He has some mild symptoms in the  contralateral left leg.   PAST MEDICAL HISTORY:  Negative for diabetes, hypertension, coronary  artery disease, COPD or stroke.   PREVIOUS SURGERY:  None.   FAMILY HISTORY:  Negative for stroke.  Positive for coronary artery  disease and a pacemaker in his uncle.   SOCIAL HISTORY:  He is married and has 4 children, works at [employer].  He does not use tobacco or alcohol.   REVIEW OF SYSTEMS:  Unremarkable except for wheezing and muscle pain.   ALLERGIES:  None known.   PHYSICAL EXAM:  Blood pressure is 150/100, heart rate is 88,  respirations 14.  General:  He is a middle-aged male in no apparent  distress, alert and oriented x3.  Neck:  Supple, 3+ carotid pulses  palpable.  No bruits are audible.  Neurologic Exam:  Is normal.  No  palpable adenopathy in the neck.  Chest:  Clear to auscultation.  Cardiovascular Exam:  Regular rhythm, no murmurs.  Abdomen:  Soft,  nontender with no masses.  He has 3+ femoral, popliteal and dorsalis  pedis pulses bilaterally.  Right leg has severe venous insufficiency  with large bulging varicosities in the distal thigh and medial calf  which are quite  large.  There is no hyperpigmentation or ulceration  noted, no evidence of any bleeding.  He has mild distal edema in the  right ankle.  There is one isolated varix in the left leg between the  ankle and the knee in the pretibial area.   Venous duplex exam in the right leg reveals gross reflux at the  saphenofemoral junction on the right, throughout the entire great  saphenous vein, with a normal deep system and no DVT.   I think he is having symptomatic venous disease in the right leg  secondary to valvular reflux.  We will treat him for 3 months with long-  leg elastic compression stockings, elevation and analgesics.  He will  return and at that time he might be a candidate for laser ablation of  the right great saphenous vein with or without stab phlebectomies.   [name], M.D.  Electronically Signed   JDL/MEDQ  D:  11/08/2008  T:  11/09/2008  Job:  [id number]

## 2010-09-01 ENCOUNTER — Ambulatory Visit (HOSPITAL_BASED_OUTPATIENT_CLINIC_OR_DEPARTMENT_OTHER): Payer: Self-pay

## 2010-09-18 ENCOUNTER — Encounter (HOSPITAL_BASED_OUTPATIENT_CLINIC_OR_DEPARTMENT_OTHER): Payer: Self-pay

## 2010-10-09 ENCOUNTER — Encounter (HOSPITAL_BASED_OUTPATIENT_CLINIC_OR_DEPARTMENT_OTHER): Payer: Self-pay

## 2010-11-01 ENCOUNTER — Ambulatory Visit (INDEPENDENT_AMBULATORY_CARE_PROVIDER_SITE_OTHER): Payer: Self-pay | Admitting: Internal Medicine

## 2010-11-01 ENCOUNTER — Encounter: Payer: Self-pay | Admitting: Internal Medicine

## 2010-11-01 VITALS — BP 138/97 | HR 86 | Temp 98.1°F | Ht 66.0 in | Wt 223.1 lb

## 2010-11-01 DIAGNOSIS — G4700 Insomnia, unspecified: Secondary | ICD-10-CM

## 2010-11-01 DIAGNOSIS — E785 Hyperlipidemia, unspecified: Secondary | ICD-10-CM

## 2010-11-01 DIAGNOSIS — I10 Essential (primary) hypertension: Secondary | ICD-10-CM

## 2010-11-01 LAB — COMPREHENSIVE METABOLIC PANEL
AST: 32 U/L (ref 0–37)
Albumin: 4.2 g/dL (ref 3.5–5.2)
Alkaline Phosphatase: 67 U/L (ref 39–117)
BUN: 23 mg/dL (ref 6–23)
Potassium: 4.1 mEq/L (ref 3.5–5.3)
Sodium: 137 mEq/L (ref 135–145)

## 2010-11-01 LAB — LIPID PANEL
HDL: 50 mg/dL (ref >39)
LDL Cholesterol: 161 mg/dL — ABNORMAL HIGH (ref 0–99)

## 2010-11-01 MED ORDER — ATENOLOL 50 MG PO TABS: 50.0000 mg | ORAL_TABLET | Freq: Every day | ORAL | Status: DC

## 2010-11-01 MED ORDER — OMEPRAZOLE 20 MG PO CPDR: 20.0000 mg | DELAYED_RELEASE_CAPSULE | Freq: Two times a day (BID) | ORAL | Status: DC

## 2010-11-01 NOTE — Assessment & Plan Note (Signed)
I have encouraged compliance with medications and advised patient to cause several days prior to needing refill. Will provide refill today. I have also encouraged patient to check his blood pressure regularly and to call his back in the numbers are higher than 140/90. We'll check electrolyte panel as well.

## 2010-11-01 NOTE — Progress Notes (Signed)
  Subjective:    Patient ID: [name] [name], male    DOB: 10-03-1955, 55 y.o.   MRN: [id number]  HPI Patient is 55 year old male with past medical history outlined below who presents to clinic for regular followup on his blood pressure and cholesterol level. He denies recent sicknesses or hospitalizations, no episodes of chest pain, no shortness of breath, no abdominal or urinary concerns. He tells me he ran out of the medications approximately one month ago and would like to get refills today. Otherwise he reports compliance with medications when he has it.   Review of Systems Constitutional: Denies fever, chills, diaphoresis, appetite change and fatigue.  HEENT: Denies photophobia, eye pain, redness, hearing loss, ear pain, congestion, sore throat, rhinorrhea, sneezing, mouth sores, trouble swallowing, neck pain, neck stiffness and tinnitus.   Respiratory: Denies SOB, DOE, cough, chest tightness,  and wheezing.   Cardiovascular: Denies chest pain, palpitations and leg swelling.  Gastrointestinal: Denies nausea, vomiting, abdominal pain, diarrhea, constipation, blood in stool and abdominal distention.  Genitourinary: Denies dysuria, urgency, frequency, hematuria, flank pain and difficulty urinating.  Musculoskeletal: Denies myalgias, back pain, joint swelling, arthralgias and gait problem.  Skin: Denies pallor, rash and wound.  Neurological: Denies dizziness, seizures, syncope, weakness, light-headedness, numbness and headaches.  Hematological: Denies adenopathy. Easy bruising, personal or family bleeding history  Psychiatric/Behavioral: Denies suicidal ideation, mood changes, confusion, nervousness, sleep disturbance and agitation       Objective:   Physical Exam  Constitutional: Vital signs reviewed.  Patient is a well-developed and well-nourished in no acute distress and cooperative with exam. Alert and oriented x3.  Cardiovascular: RRR, S1 normal, S2 normal, no MRG, pulses symmetric  and intact bilaterally Pulmonary/Chest: CTAB, no wheezes, rales, or rhonchi Abdominal: Soft. Non-tender, non-distended, bowel sounds are normal, no masses, organomegaly, or guarding present.  Psychiatric: Normal mood and affect. speech and behavior is normal. Judgment and thought content normal. Cognition and memory are normal.         Assessment & Plan:

## 2010-11-01 NOTE — Patient Instructions (Signed)
Please schedule appointment in 3months

## 2010-11-01 NOTE — Assessment & Plan Note (Signed)
Will check lipid panel today and will readjust medication regimen if indicated. Patient is currently only controlling cholesterol with diet and exercise and reports compliance. Encouraged continuation with low cholesterol diet and exercise

## 2010-11-02 NOTE — Progress Notes (Signed)
Addended by: Youlanda Roys A on: 11/02/2010 11:21 AM   Modules accepted: Orders

## 2010-11-05 ENCOUNTER — Ambulatory Visit (HOSPITAL_BASED_OUTPATIENT_CLINIC_OR_DEPARTMENT_OTHER): Payer: Self-pay

## 2011-01-09 LAB — BASIC METABOLIC PANEL
Calcium: 9.3
Chloride: 105
Creatinine, Ser: 1.1
GFR calc Af Amer: >60
GFR calc non Af Amer: >60

## 2011-01-24 ENCOUNTER — Encounter: Payer: Self-pay | Admitting: Internal Medicine

## 2011-01-24 ENCOUNTER — Ambulatory Visit (INDEPENDENT_AMBULATORY_CARE_PROVIDER_SITE_OTHER): Payer: Self-pay | Admitting: Internal Medicine

## 2011-01-24 VITALS — BP 153/97 | HR 80 | Temp 97.7°F | Ht 66.0 in | Wt 221.5 lb

## 2011-01-24 DIAGNOSIS — J069 Acute upper respiratory infection, unspecified: Secondary | ICD-10-CM | POA: Insufficient documentation

## 2011-01-24 DIAGNOSIS — R05 Cough: Secondary | ICD-10-CM

## 2011-01-24 MED ORDER — HYDROCODONE-ACETAMINOPHEN 10-300 MG/15ML PO SOLN: 5.0000 mL | Freq: Three times a day (TID) | ORAL | Status: AC | PRN

## 2011-01-24 NOTE — Assessment & Plan Note (Signed)
Acute, most likely viral. Encourage fluid intake, nasal decongestion and prescribed cough syrup. Patient is advised to return if symptoms are worse

## 2011-01-24 NOTE — Patient Instructions (Signed)
Viral Syndrome You or your child has Viral Syndrome. It is the most common infection causing "colds" and infections in the nose, throat, sinuses, and breathing tubes. Sometimes the infection causes nausea, vomiting, or diarrhea. The germ that causes the infection is a virus. No antibiotic or other medicine will kill it. There are medicines that you can take to make you or your child more comfortable.  HOME CARE INSTRUCTIONS   Rest in bed until you start to feel better.   If you have diarrhea or vomiting, eat small amounts of crackers and toast. Soup is helpful.   Do not give aspirin or medicine that contains aspirin to children.   Only take over-the-counter or prescription medicines for pain, discomfort, or fever as directed by your caregiver.  SEEK IMMEDIATE MEDICAL CARE IF:   You or your child has not improved within one week.   You or your child has pain that is not at least partially relieved by over-the-counter medicine.   Thick, colored mucus or blood is coughed up.   Discharge from the nose becomes thick yellow or green.   Diarrhea or vomiting gets worse.   There is any major change in your or your child's condition.   You or your child develops a skin rash, stiff neck, severe headache, or are unable to hold down food or fluid.   You or your child has an oral temperature above 102 F (38.9 C), not controlled by medicine.   Your baby is older than 3 months with a rectal temperature of 102 F (38.9 C) or higher.   Your baby is 3 months old or younger with a rectal temperature of 100.4 F (38 C) or higher.  Document Released: 02/25/2006 Document Revised: 11/22/2010 Document Reviewed: 02/26/2007 [employer] Patient Information 2012 [employer]. 

## 2011-01-24 NOTE — Progress Notes (Signed)
  Subjective:    Patient ID: [name] [name], male    DOB: 05/23/1955, 56 y.o.   MRN: [id number]  HPI  Patient is a 55 year old male with a past medical history below presents with symptoms of upper respiratory infection for the past 3-4 days, most noticeably patient has coughing and congestion. Denies any fever chills. Denies any recent sick contacts. No other complaints  Review of Systems  HENT: Positive for congestion and sore throat.   Respiratory: Positive for cough. Negative for chest tightness, wheezing and stridor.   All other systems reviewed and are negative.       Objective:   Physical Exam  Nursing note and vitals reviewed. Constitutional: He is oriented to person, place, and time. He appears well-developed and well-nourished.  HENT:  Head: Normocephalic and atraumatic.  Eyes: Pupils are equal, round, and reactive to light.  Neck: Normal range of motion. No JVD present. No thyromegaly present.  Cardiovascular: Normal rate, regular rhythm and normal heart sounds.   Pulmonary/Chest: Effort normal and breath sounds normal. He has no wheezes. He has no rales.  Abdominal: Soft. Bowel sounds are normal. There is no tenderness. There is no rebound.  Musculoskeletal: Normal range of motion. He exhibits no edema.  Lymphadenopathy:    He has no cervical adenopathy.  Neurological: He is alert and oriented to person, place, and time.  Skin: Skin is warm and dry.          Assessment & Plan:

## 2011-03-27 HISTORY — PX: COLONOSCOPY: SHX174

## 2011-03-30 ENCOUNTER — Other Ambulatory Visit: Payer: Self-pay | Admitting: *Deleted

## 2011-04-06 NOTE — Telephone Encounter (Signed)
Opened in error

## 2011-04-18 ENCOUNTER — Ambulatory Visit: Payer: Self-pay | Admitting: Internal Medicine

## 2011-04-27 ENCOUNTER — Encounter: Payer: Self-pay | Admitting: Internal Medicine

## 2011-04-27 ENCOUNTER — Ambulatory Visit (INDEPENDENT_AMBULATORY_CARE_PROVIDER_SITE_OTHER): Payer: Self-pay | Admitting: Internal Medicine

## 2011-04-27 DIAGNOSIS — I10 Essential (primary) hypertension: Secondary | ICD-10-CM

## 2011-04-27 DIAGNOSIS — R0609 Other forms of dyspnea: Secondary | ICD-10-CM

## 2011-04-27 DIAGNOSIS — M79651 Pain in right thigh: Secondary | ICD-10-CM | POA: Insufficient documentation

## 2011-04-27 DIAGNOSIS — I8390 Asymptomatic varicose veins of unspecified lower extremity: Secondary | ICD-10-CM

## 2011-04-27 DIAGNOSIS — M79609 Pain in unspecified limb: Secondary | ICD-10-CM

## 2011-04-27 MED ORDER — IBUPROFEN 200 MG PO TABS: 400.0000 mg | ORAL_TABLET | Freq: Four times a day (QID) | ORAL | Status: AC | PRN

## 2011-04-27 NOTE — Assessment & Plan Note (Addendum)
[name] [name] describes symptoms that are concerning for possible CHF, especially the dyspnea on relatively minimal exertion. He does have long-standing hypertension which might predispose him to diastolic dysfunction. He also has chronic lower leg varicosities indicating impaired venous return. She does have heart failure would be likely right-sided heart failure given that he has clear lungs. He would be at risk for right heart failure given his symptoms consistent with obstructive sleep apnea.  As I will be away for the next 2 months I will have [name] [name] followup in our clinic within the month with one of the [facility] residents. In the meantime I will have him return on Monday for BNP, CBC, Chem-7, and chest x-ray. He will need an EKG and an echocardiogram organized at his next visit. I would have done these things today however [name] [name] stated he needs to attend another appointment and could not wait. I again referred [name] [name] for sleep study.

## 2011-04-27 NOTE — Progress Notes (Signed)
Subjective:     Patient ID: [name] [name], male   DOB: [birth year], 56 y.o.   MRN: [id number]  The patient is a 56 year old man with past medical history significant for hypertension, hyperlipidemia, varicose veins, and a question of obstructive sleep apnea the this has not been worked up. He presents today for cough and sputum production.  [name] [name] states that his cough has been ongoing for greater than 3 years. It is productive, mainly for clear sputum though does occasionally yellow and thick. He states that he sleeps on 2-3 pillows and when he does sleep in a bed often sleeps sitting up right. He does have PND. He endorses severe dyspnea on exertion and gets short of breath when walking on flat surfaces. If he goes up one flight of stairs he will have to stop and catch his breath midway. He denies any resting or exertional chest pain.   He wakes up with a morning headache approximately 2-3 days out of the week. He has never attended his sleep study which she was referred to. His male companion states that he often snores very loudly and stops breathing at night. He does state that he falls asleep during the day. Though he has not done so recently, he has fallen asleep while driving a car. [name] [name] states that he does take his atenolol every day though does not take any of the other medications that have been prescribed to him.  Shortness of Breath This is a chronic problem. The current episode started more than 1 year ago. The problem occurs daily. The problem has been gradually worsening. Associated symptoms include headaches, leg pain, leg swelling, orthopnea, PND, rhinorrhea and sputum production. Pertinent negatives include no abdominal pain, chest pain, claudication, fever, hemoptysis, neck pain, rash, swollen glands, syncope, vomiting or wheezing. The symptoms are aggravated by any activity, lying flat and weather changes. Risk factors: He does have significant lower extremity  varicosities, which might predispose him to having thrombophlebitis. His past medical history is significant for allergies. There is no history of CAD, chronic lung disease, COPD or pneumonia.  Leg Pain  The incident occurred 5 to 7 days ago. The incident occurred at work. There was no injury mechanism. The pain is present in the right thigh. The quality of the pain is described as aching. The pain is moderate. The pain has been constant since onset. Pertinent negatives include no inability to bear weight, loss of motion, loss of sensation, muscle weakness, numbness or tingling. The symptoms are aggravated by movement and weight bearing. He has tried nothing for the symptoms.   pain is localized to his right lateral thigh.  Varicose veins This has been a chronic problem lasting many years. He cannot think of any inciting event. They are present on the bilateral legs with the right leg being more severely involved. He has aching/itching pain that is moderate in intensity. He states the pain is made worse by prolonged standing. He cannot tell me anything that makes it better. Pertinent negatives include the absence of hot, corded vessels that are noncompressible, numbness, or leg claudication. He has seen a vascular specialist for his legs, but since he has no insurance they stated it is mainly symptom management. He has tried compression stockings though he states these hurt his legs.   Review of Systems  Constitutional: Positive for activity change and fatigue. Negative for fever, chills, diaphoresis and appetite change.  HENT: Positive for rhinorrhea. Negative for neck pain.   Respiratory:  Positive for apnea, cough, sputum production and shortness of breath. Negative for hemoptysis, choking, chest tightness and wheezing.   Cardiovascular: Positive for orthopnea, leg swelling and PND. Negative for chest pain, claudication and syncope.  Gastrointestinal: Negative for vomiting, abdominal pain and  abdominal distention.  Musculoskeletal: Positive for myalgias and arthralgias.  Skin: Positive for color change. Negative for rash.  Neurological: Positive for headaches. Negative for dizziness, tingling, syncope and numbness.       Objective:   Physical Exam  Constitutional: He is oriented to person, place, and time. He appears well-developed and well-nourished. No distress.  HENT:  Head: Normocephalic and atraumatic.  Mouth/Throat: Oropharynx is clear and moist. Mucous membranes are not pale and not dry. Normal dentition. Dental abscesses and dental caries present. No oropharyngeal exudate, posterior oropharyngeal edema or posterior oropharyngeal erythema.       Patient does have a lot of soft tissue in his oropharynx and a very narrow airway  Neck: No hepatojugular reflux and no JVD present.  Cardiovascular: Normal rate, regular rhythm, normal heart sounds and intact distal pulses.  Exam reveals no gallop and no friction rub.   No murmur heard. Pulmonary/Chest: Effort normal and breath sounds normal. No respiratory distress. He has no wheezes. He has no rales. He exhibits no tenderness.  Abdominal: Soft. Bowel sounds are normal. He exhibits no distension. There is no tenderness. There is no rebound and no guarding.  Musculoskeletal: Normal range of motion. He exhibits tenderness. He exhibits no edema.       Left lower leg: He exhibits deformity.       Legs:      Bilateral tender lower extremity varicosities right greater than left.  Neurological: He is alert and oriented to person, place, and time. No cranial nerve deficit.  Skin: Skin is warm and dry. He is not diaphoretic. There is erythema.  Psychiatric: He has a normal mood and affect. His behavior is normal. Judgment and thought content normal.       Assessment:    see problem-based charting for assessment and plan

## 2011-04-27 NOTE — Assessment & Plan Note (Signed)
I again told Mr. Lancon that symptom management is probably the best we can do at this time, if he cannot afford surgery. I suggested elevation, leg compression stockings, and ibuprofen for pain relief. I told him that if he is taking ibuprofen I definitely want him to take the omeprazole that he has been prescribed already.

## 2011-04-27 NOTE — Assessment & Plan Note (Addendum)
This sounds to be musculoskeletal in origin. [name] [name] does work in a [employer] a lot. He has not tried any symptomatic relief. I recommended ibuprofen, elevation, and ice therapy. It does not resolve in 2 weeks, he should followup in clinic.

## 2011-04-27 NOTE — Patient Instructions (Addendum)
Come back Monday 04/30/11 to have your labs drawn.    We have referred you for a sleep study.  They will call you to set up the appointiment.  Please take ibuprofen for your leg pain and headache.  Also take you omeprazole every day.    The proper technique for using your inhaler is as follows: Take a deep breath in and exhale completely. With you next deep breath in, activate your inhaler and inhale the medication as deeply as you can. Hold this breath in for 7 full seconds. This counts as one puff.  If any of your lab results are abnormal we will contact you by phone or send you a letter. If they are normal, we will not contact you, but will be happy to discuss them at your next clinic appointment.  Return to clinic in 1 month. Please bring all your medications to your next clinic appointment.    Hypertension As your heart beats, it forces blood through your arteries. This force is your blood pressure. If the pressure is too high, it is called hypertension (HTN) or high blood pressure. HTN is dangerous because you may have it and not know it. High blood pressure may mean that your heart has to work harder to pump blood. Your arteries may be narrow or stiff. The extra work puts you at risk for heart disease, stroke, and other problems.  Blood pressure consists of two numbers, a higher number over a lower, 110/72, for example. It is stated as "110 over 72." The ideal is below 120 for the top number (systolic) and under 80 for the bottom (diastolic). Write down your blood pressure today. You should pay close attention to your blood pressure if you have certain conditions such as:  Heart failure.   Prior heart attack.   Diabetes   Chronic kidney disease.   Prior stroke.   Multiple risk factors for heart disease.  To see if you have HTN, your blood pressure should be measured while you are seated with your arm held at the level of the heart. It should be measured at least twice. A one-time  elevated blood pressure reading (especially in the Emergency Department) does not mean that you need treatment. There may be conditions in which the blood pressure is different between your right and left arms. It is important to see your caregiver soon for a recheck. Most people have essential hypertension which means that there is not a specific cause. This type of high blood pressure may be lowered by changing lifestyle factors such as:  Stress.   Smoking.   Lack of exercise.   Excessive weight.   Drug/tobacco/alcohol use.   Eating less salt.  Most people do not have symptoms from high blood pressure until it has caused damage to the body. Effective treatment can often prevent, delay or reduce that damage. TREATMENT  When a cause has been identified, treatment for high blood pressure is directed at the cause. There are a large number of medications to treat HTN. These fall into several categories, and your caregiver will help you select the medicines that are best for you. Medications may have side effects. You should review side effects with your caregiver. If your blood pressure stays high after you have made lifestyle changes or started on medicines,   Your medication(s) may need to be changed.   Other problems may need to be addressed.   Be certain you understand your prescriptions, and know how and when to  take your medicine.   Be sure to follow up with your caregiver within the time frame advised (usually within two weeks) to have your blood pressure rechecked and to review your medications.   If you are taking more than one medicine to lower your blood pressure, make sure you know how and at what times they should be taken. Taking two medicines at the same time can result in blood pressure that is too low.  SEEK IMMEDIATE MEDICAL CARE IF:  You develop a severe headache, blurred or changing vision, or confusion.   You have unusual weakness or numbness, or a faint feeling.    You have severe chest or abdominal pain, vomiting, or breathing problems.  MAKE SURE YOU:   Understand these instructions.   Will watch your condition.   Will get help right away if you are not doing well or get worse.  Document Released: 03/12/2005 Document Revised: 11/22/2010 Document Reviewed: 10/31/2007 [facility] Patient Information 2012 [place], [place].

## 2011-04-30 ENCOUNTER — Ambulatory Visit (HOSPITAL_COMMUNITY)
Admission: RE | Admit: 2011-04-30 | Discharge: 2011-04-30 | Disposition: A | Discharge status: Home / Self Care | Payer: Self-pay | Source: Ambulatory Visit | Attending: Internal Medicine | Admitting: Internal Medicine

## 2011-04-30 ENCOUNTER — Other Ambulatory Visit (INDEPENDENT_AMBULATORY_CARE_PROVIDER_SITE_OTHER): Payer: Self-pay

## 2011-04-30 DIAGNOSIS — R0609 Other forms of dyspnea: Secondary | ICD-10-CM | POA: Insufficient documentation

## 2011-04-30 DIAGNOSIS — I10 Essential (primary) hypertension: Secondary | ICD-10-CM

## 2011-04-30 DIAGNOSIS — R0989 Other specified symptoms and signs involving the circulatory and respiratory systems: Secondary | ICD-10-CM

## 2011-04-30 LAB — CBC WITH DIFFERENTIAL/PLATELET
Eosinophils Absolute: 0.3 10*3/uL (ref 0.0–0.7)
Eosinophils Relative: 5 % (ref 0–5)
HCT: 39.3 % (ref 39.0–52.0)
Hemoglobin: 12.8 g/dL — ABNORMAL LOW (ref 13.0–17.0)
Lymphocytes Relative: 40 % (ref 12–46)
Lymphs Abs: 2.1 10*3/uL (ref 0.7–4.0)
MCH: 29.4 pg (ref 26.0–34.0)
MCV: 90.1 fL (ref 78.0–100.0)
Monocytes Absolute: 0.5 10*3/uL (ref 0.1–1.0)
Monocytes Relative: 9 % (ref 3–12)
Neutro Abs: 2.4 10*3/uL (ref 1.7–7.7)
RBC: 4.36 MIL/uL (ref 4.22–5.81)
WBC: 5.3 10*3/uL (ref 4.0–10.5)

## 2011-04-30 LAB — BASIC METABOLIC PANEL WITH GFR
Calcium: 9.6 mg/dL (ref 8.4–10.5)
Creat: 1.18 mg/dL (ref 0.50–1.35)
GFR, Est African American: 80 mL/min
GFR, Est Non African American: 70 mL/min
Potassium: 4.1 mEq/L (ref 3.5–5.3)
Sodium: 143 mEq/L (ref 135–145)

## 2011-04-30 LAB — BRAIN NATRIURETIC PEPTIDE: Brain Natriuretic Peptide: 3 pg/mL (ref 0.0–100.0)

## 2011-05-15 ENCOUNTER — Ambulatory Visit (HOSPITAL_BASED_OUTPATIENT_CLINIC_OR_DEPARTMENT_OTHER): Payer: Self-pay | Attending: Internal Medicine

## 2011-05-15 VITALS — Ht 66.0 in | Wt 226.0 lb

## 2011-05-15 DIAGNOSIS — G4761 Periodic limb movement disorder: Secondary | ICD-10-CM | POA: Insufficient documentation

## 2011-05-15 DIAGNOSIS — G4733 Obstructive sleep apnea (adult) (pediatric): Secondary | ICD-10-CM | POA: Insufficient documentation

## 2011-05-19 DIAGNOSIS — R0609 Other forms of dyspnea: Secondary | ICD-10-CM

## 2011-05-19 DIAGNOSIS — G4733 Obstructive sleep apnea (adult) (pediatric): Secondary | ICD-10-CM

## 2011-05-19 DIAGNOSIS — R0989 Other specified symptoms and signs involving the circulatory and respiratory systems: Secondary | ICD-10-CM

## 2011-05-19 DIAGNOSIS — G4761 Periodic limb movement disorder: Secondary | ICD-10-CM

## 2011-05-19 NOTE — Procedures (Signed)
[name] [name], [name] [name] NO.:  [id number]  MEDICAL RECORD NO.:  [id number]          PATIENT TYPE:  OUT  LOCATION:  SLEEP CENTER                 FACILITY:  [facility]  PHYSICIAN:  Francis Yardley D. Maple Hudson, MD, FCCP, FACPDATE OF BIRTH:  05[birth year]03-1955  DATE OF STUDY:  05/15/2011                           NOCTURNAL POLYSOMNOGRAM  REFERRING PHYSICIAN:  Quentin Ore, MD  INDICATION FOR STUDY:  Hypersomnia with sleep apnea.  EPWORTH SLEEPINESS SCORE:  13/24.  BMI 36, weight 226 pounds, height 66 inches, neck 16.5 inches.  HOME MEDICATIONS:  Charted and reviewed.  SLEEP ARCHITECTURE:  Total sleep time 183.5 minutes with sleep efficiency 43.8%.  Stage I was 24.5%.  Stage II 61.6%.  Stage III absent.  REM 13.9% of total sleep time.  Sleep latency 50.5 minutes. REM latency 40 minutes.  Awake after sleep onset 177.5 minutes.  Arousal index 27.5.  Bedtime medication:  None.  He was spontaneously awake between midnight and 2 a.m. and between 3 a.m. and 4 a.m.  RESPIRATORY DATA:  Apnea/hypotony index (AHI) 48.1 per hour.  A total of 147 events was scored including 61 obstructive apneas, 26 central apneas, 1 mixed apnea, 59 hypopneas.  Events were associated with nonsupine sleep position.  REM AHI 65.9 per hour.  Because of his difficulty initiating and sustaining sleep, he did not meet the cumulative time asleep requirement in the first hours of the study to permit application of split protocol, CPAP titration on this study night.  OXYGEN DATA:  Moderate snoring with oxygen desaturation to a nadir of 86% and mean oxygen saturation through the study of 95% on room air.  CARDIAC DATA:  Normal sinus rhythm.  MOVEMENT/PARASOMNIA:  Some limb jerks were noted.  A total of 20 limb jerks were countered of which 8 were associated with arousal or awakening for periodic limb movement with arousal index of 2.6 per hour. Bathroom x1.  IMPRESSION/RECOMMENDATION: 1. Sleep was marked by significant  intervals of spontaneous     wakefulness.  If this represents his pattern in the home     environment, then he may need some help managing an insomnia. 2. Severe obstructive sleep apnea/hypopnea syndrome, AHI 48.1 per hour     with nonsupine events, moderate snoring, and oxygen desaturation to     a nadir of 86% and mean oxygen saturation through the study of 95%     on room air. 3. Because of his inability to sustain sleep, he was unable to meet     protocol requirements for a trial of CPAP by split protocol     titration on this study night.  Consider return for CPAP titration     study bringing a sleep medication if appropriate. 4. 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  A total of 20     limb jerks were counted, of which 8 were associated with arousal or     awakening for periodic limb movement with arousal index of 2.6 per     hour, which may not be clinically significant.  Bathroom x1. 5. On his intake questionnaire, he indicated that he wakes at home     with choking and sometimes vomiting.  This  is strongly suggestive     of reflux which may be a separate diagnosis also requiring     attention.     Saundra Gin D. Maple Hudson, MD, [facility], FACP Diplomate, American Board of Sleep Medicine    CDY/MEDQ  D:  05/19/2011 10:49:39  T:  05/19/2011 11:19:28  Job:  [id number]

## 2011-06-21 ENCOUNTER — Telehealth: Payer: Self-pay | Admitting: Internal Medicine

## 2011-06-21 ENCOUNTER — Encounter: Payer: Self-pay | Admitting: Internal Medicine

## 2011-06-21 DIAGNOSIS — G4730 Sleep apnea, unspecified: Secondary | ICD-10-CM | POA: Insufficient documentation

## 2011-06-21 DIAGNOSIS — G4733 Obstructive sleep apnea (adult) (pediatric): Secondary | ICD-10-CM

## 2011-06-21 DIAGNOSIS — G4700 Insomnia, unspecified: Secondary | ICD-10-CM

## 2011-06-21 NOTE — Telephone Encounter (Signed)
I called the patient to request that he reschedule his appointment today. . I also informed him that I wished to discuss the results of his sleep study with him. Patient is requested to call the [facility] to discuss results with me today I will place order for 2-D echo and EKG to further evaluate dyspnea on exertion at this time.

## 2011-07-18 ENCOUNTER — Ambulatory Visit (HOSPITAL_COMMUNITY): Payer: Self-pay | Attending: Cardiovascular Disease

## 2011-07-23 ENCOUNTER — Encounter: Payer: Self-pay | Admitting: *Deleted

## 2011-07-23 NOTE — Progress Notes (Unsigned)
Pt stopped by the office today to talk with Dr Candy Sledge. We explained his doctor was not here today but would be able to call him on Thursday while in clinic. Please call pt on Thursday 5/2 to advise on sleep study results.  Pt # [phone]

## 2011-07-27 ENCOUNTER — Encounter: Payer: Self-pay | Admitting: Internal Medicine

## 2011-07-27 ENCOUNTER — Telehealth: Payer: Self-pay | Admitting: Internal Medicine

## 2011-07-27 NOTE — Progress Notes (Signed)
This encounter was created in error - please disregard.

## 2011-07-27 NOTE — Telephone Encounter (Signed)
I called the patient to inform him that he would need a clinic appointment to workup the GERD that was indicated on his intake form for a sleep study. Also he will need CPAP titration which we can arrange at that time.

## 2011-08-01 ENCOUNTER — Other Ambulatory Visit: Payer: Self-pay | Admitting: *Deleted

## 2011-08-01 MED ORDER — ATENOLOL 50 MG PO TABS: 50.0000 mg | ORAL_TABLET | Freq: Every day | ORAL | Status: DC

## 2011-08-01 NOTE — Telephone Encounter (Signed)
Pt has appt 07/2011.

## 2011-08-01 NOTE — Telephone Encounter (Signed)
Rx called to pharmacy

## 2011-08-15 ENCOUNTER — Ambulatory Visit (HOSPITAL_COMMUNITY)
Admission: RE | Admit: 2011-08-15 | Discharge: 2011-08-15 | Disposition: A | Discharge status: Home / Self Care | Payer: Self-pay | Source: Ambulatory Visit | Attending: Internal Medicine | Admitting: Internal Medicine

## 2011-08-15 ENCOUNTER — Encounter: Payer: Self-pay | Admitting: Internal Medicine

## 2011-08-15 ENCOUNTER — Ambulatory Visit (INDEPENDENT_AMBULATORY_CARE_PROVIDER_SITE_OTHER): Payer: Self-pay | Admitting: Internal Medicine

## 2011-08-15 VITALS — BP 135/98 | HR 88 | Temp 98.0°F | Ht 66.0 in | Wt 224.3 lb

## 2011-08-15 DIAGNOSIS — K219 Gastro-esophageal reflux disease without esophagitis: Secondary | ICD-10-CM | POA: Insufficient documentation

## 2011-08-15 DIAGNOSIS — G4700 Insomnia, unspecified: Secondary | ICD-10-CM

## 2011-08-15 DIAGNOSIS — G4733 Obstructive sleep apnea (adult) (pediatric): Secondary | ICD-10-CM

## 2011-08-15 DIAGNOSIS — G4730 Sleep apnea, unspecified: Secondary | ICD-10-CM

## 2011-08-15 DIAGNOSIS — R0989 Other specified symptoms and signs involving the circulatory and respiratory systems: Secondary | ICD-10-CM

## 2011-08-15 DIAGNOSIS — I509 Heart failure, unspecified: Secondary | ICD-10-CM | POA: Insufficient documentation

## 2011-08-15 LAB — COMPLETE METABOLIC PANEL WITH GFR
ALT: 25 U/L (ref 0–53)
AST: 25 U/L (ref 0–37)
Albumin: 4.1 g/dL (ref 3.5–5.2)
Alkaline Phosphatase: 75 U/L (ref 39–117)
BUN: 19 mg/dL (ref 6–23)
CO2: 22 mEq/L (ref 19–32)
Calcium: 9.2 mg/dL (ref 8.4–10.5)
Chloride: 108 mEq/L (ref 96–112)
Creat: 0.95 mg/dL (ref 0.50–1.35)
GFR, Est African American: >89 mL/min
GFR, Est Non African American: >89 mL/min
Glucose, Bld: 88 mg/dL (ref 70–99)
Potassium: 3.7 mEq/L (ref 3.5–5.3)
Sodium: 141 mEq/L (ref 135–145)
Total Bilirubin: 0.5 mg/dL (ref 0.3–1.2)
Total Protein: 7.3 g/dL (ref 6.0–8.3)

## 2011-08-15 MED ORDER — ZOLPIDEM TARTRATE 5 MG PO TABS: 5.0000 mg | ORAL_TABLET | Freq: Every evening | ORAL | Status: DC | PRN

## 2011-08-15 MED ORDER — ALBUTEROL SULFATE HFA 108 (90 BASE) MCG/ACT IN AERS
2.0000 | INHALATION_SPRAY | Freq: Four times a day (QID) | RESPIRATORY_TRACT | Status: DC | PRN
Start: 2011-08-15 — End: 2013-05-14

## 2011-08-15 MED ORDER — OMEPRAZOLE 20 MG PO CPDR: 40.0000 mg | DELAYED_RELEASE_CAPSULE | Freq: Every day | ORAL | Status: DC

## 2011-08-15 NOTE — Progress Notes (Signed)
Subjective:     Patient ID: [name] [name], male   DOB: 1955/11/18, 56 y.o.   MRN: [id number]  Gastrophageal Reflux He complains of belching, chest pain, choking, coughing and heartburn. He reports no abdominal pain, no dysphagia, no early satiety, no hoarse voice, no nausea, no sore throat, no stridor, no tooth decay, no water brash or no wheezing. when he eats an lays down he chokes and vomits. This is a chronic (more than 10 years) problem. The current episode started more than 1 year ago. The problem occurs constantly (Has everyday.). The problem has been waxing and waning. The heartburn duration is several minutes. The heartburn is located in the substernum. The heartburn is of moderate intensity. The heartburn wakes him from sleep. The heartburn limits his activity. The heartburn changes (Better when sitting up) with position. The symptoms are aggravated by lying down and certain foods. Associated symptoms include fatigue. Pertinent negatives include no melena or weight loss. Risk factors include ETOH use, smoking/tobacco exposure, obesity and lack of exercise. He has tried a PPI (prescription from [facility] improved, but cannot remember. ) for the symptoms. The treatment provided mild relief. Past procedures do not include an EGD.  Shortness of Breath This is a chronic problem. The current episode started more than 1 month ago (began 3 months ago). The problem occurs intermittently (about once per week). The problem has been unchanged. Duration: only happens with exertion. Associated symptoms include chest pain, claudication, headaches, leg pain, leg swelling, PND and sputum production. Pertinent negatives include no abdominal pain, fever, hemoptysis, orthopnea, sore throat, syncope, vomiting or wheezing. The symptoms are aggravated by exercise. Risk factors include no known risk factors. He has tried beta agonist inhalers (State is out of his inhaler but would probably fix his problem.) for the symptoms.  The treatment provided significant relief. His past medical history is significant for allergies and asthma. There is no history of aspirin allergies, CAD, DVT, a heart failure, PE or a recent surgery.     Review of Systems  Constitutional: Positive for fatigue. Negative for fever, chills, weight loss, diaphoresis, activity change and appetite change.  HENT: Negative for congestion, sore throat and hoarse voice.   Eyes: Negative for pain and visual disturbance.  Respiratory: Positive for cough, sputum production, choking and shortness of breath. Negative for hemoptysis and wheezing.   Cardiovascular: Positive for chest pain, claudication, leg swelling and PND. Negative for orthopnea and syncope.  Gastrointestinal: Positive for heartburn. Negative for dysphagia, nausea, vomiting, abdominal pain, constipation, blood in stool, melena and anal bleeding.  Genitourinary: Negative for difficulty urinating.  Musculoskeletal: Negative for back pain.  Skin: Negative.   Neurological: Positive for headaches. Negative for dizziness, syncope, weakness, light-headedness and numbness.  Psychiatric/Behavioral: Negative for confusion.       Objective:   Physical Exam  Vitals reviewed. Constitutional: He is oriented to person, place, and time. He appears well-developed and well-nourished. No distress.  HENT:  Head: Normocephalic.  Mouth/Throat: Oropharynx is clear and moist.  Eyes: Pupils are equal, round, and reactive to light.  Neck: Normal range of motion. Neck supple. JVD present.  Cardiovascular: Normal rate, regular rhythm and normal heart sounds.   Pulmonary/Chest: Effort normal and breath sounds normal. No respiratory distress. He has no wheezes. He has no rales.  Abdominal: Soft. Bowel sounds are normal. He exhibits no distension and no mass. There is tenderness. There is no rebound and no guarding.  Musculoskeletal: Normal range of motion. He exhibits edema.  Legs: Neurological: He is  alert and oriented to person, place, and time. No cranial nerve deficit. Coordination normal.  Skin: Skin is warm. No rash noted. He is not diaphoretic. No erythema. No pallor.  Psychiatric: He has a normal mood and affect. His speech is normal and behavior is normal. Judgment and thought content normal.   States he is having difficulty sleeping. He has difficulty staying asleep and often wakes up gasping for air. He also has difficulty falling asleep initially. He does endorse feeling tired during the day.    Assessment:     Please see problem oriented charting (best viewed under encounters tab).

## 2011-08-15 NOTE — Patient Instructions (Addendum)
Your EKG was normal. We will call you to set up your echocardiogram.    We will set up your referral to a gastroenterologist (stomach doctor).  We will call you with an appointment. Increase your opmeprazole (stomach acid pill) to 2 pills daily. We will set up your referral to get your CPAP titration with the sleep doctor.  We will call you with an appointment. Also, begin to take zolpidem at bed time to help you sleep. Be sure to take this medicine with you when you go to the sleep lab.   The proper technique for using your inhaler is as follows: Take a deep breath in and exhale completely. With you next deep breath in, activate your inhaler and inhale the medication as deeply as you can. Hold this breath in for 7 full seconds. This counts as one puff.    If any of your lab results are abnormal we will contact you by phone or send you a letter. If they are normal, we will not contact you, but will be happy to discuss them at your next clinic appointment.  Return to clinic to see [place] in 1-2 months (after you have had the echocardiogram) Please bring all your medications to your next clinic appointment.

## 2011-08-16 ENCOUNTER — Encounter: Payer: Self-pay | Admitting: Internal Medicine

## 2011-08-16 ENCOUNTER — Telehealth: Payer: Self-pay | Admitting: Internal Medicine

## 2011-08-16 LAB — CBC WITH DIFFERENTIAL/PLATELET
Basophils Relative: 1 % (ref 0–1)
Hemoglobin: 13 g/dL (ref 13.0–17.0)
Lymphs Abs: 2.1 10*3/uL (ref 0.7–4.0)
MCHC: 33.5 g/dL (ref 30.0–36.0)
Monocytes Relative: 8 % (ref 3–12)
Neutro Abs: 2.6 10*3/uL (ref 1.7–7.7)
Neutrophils Relative %: 48 % (ref 43–77)
Platelets: 248 10*3/uL (ref 150–400)
RBC: 4.33 MIL/uL (ref 4.22–5.81)

## 2011-08-16 NOTE — Telephone Encounter (Signed)
Rec'd Phone call from [place] at [facility] .  She called to give appointment for patient on 08/23/2011 at 11:30am.  Patient should arrive at 11:15am.  No co-pay is due and no fasting is required.  If patient needs to reschedule he needs to call them directly at [phone].  The office is located at [address] on the 3rd Floor [place].

## 2011-08-18 NOTE — Assessment & Plan Note (Signed)
Patient continues to have obstructive sleep apnea and poor sleep with daytime somnolence. Today we placed a referral for him to have CPAP titration. I also provided prescription for sleeping aid so that he will be able to sleep during the test.

## 2011-08-18 NOTE — Assessment & Plan Note (Addendum)
Patient does endorse difficulty falling asleep and staying asleep. This combined with the report of his sleep study does indicate that the patient suffers from insomnia. Prescription was given for zolpidem 5 mg each bedtime. The patient was instructed to bring this with him to the sleep study.

## 2011-08-18 NOTE — Assessment & Plan Note (Addendum)
The patient states that he has continued to have dyspnea on exertion. His lungs again are clear today indicating that he might have right-sided heart failure if his heart is cause of dyspnea on exertion. Chest x-ray from February was normal, with normal cardiac silhouette. Today EKG was normal. We have scheduled him for 2-D echo. He will followup in one month after he has had the echo. At that time he should have BNP. Interestingly today the patient did tell me that his shortness of breath is improved with albuterol. I gave him a prescription and asked him to see if this is again helps him.

## 2011-08-22 ENCOUNTER — Other Ambulatory Visit (HOSPITAL_COMMUNITY): Payer: Self-pay | Admitting: Radiology

## 2011-08-22 DIAGNOSIS — R0602 Shortness of breath: Secondary | ICD-10-CM

## 2011-08-23 ENCOUNTER — Ambulatory Visit (HOSPITAL_COMMUNITY): Payer: Self-pay

## 2011-08-24 ENCOUNTER — Encounter: Payer: Self-pay | Admitting: Gastroenterology

## 2011-08-28 ENCOUNTER — Other Ambulatory Visit (HOSPITAL_COMMUNITY): Payer: Self-pay

## 2011-08-28 ENCOUNTER — Ambulatory Visit (HOSPITAL_COMMUNITY): Payer: Self-pay

## 2011-09-05 ENCOUNTER — Ambulatory Visit (HOSPITAL_COMMUNITY): Payer: Self-pay | Attending: Cardiology | Admitting: Radiology

## 2011-09-05 DIAGNOSIS — I517 Cardiomegaly: Secondary | ICD-10-CM | POA: Insufficient documentation

## 2011-09-05 DIAGNOSIS — R609 Edema, unspecified: Secondary | ICD-10-CM | POA: Insufficient documentation

## 2011-09-05 DIAGNOSIS — G4733 Obstructive sleep apnea (adult) (pediatric): Secondary | ICD-10-CM | POA: Insufficient documentation

## 2011-09-05 DIAGNOSIS — F17200 Nicotine dependence, unspecified, uncomplicated: Secondary | ICD-10-CM | POA: Insufficient documentation

## 2011-09-05 DIAGNOSIS — R0989 Other specified symptoms and signs involving the circulatory and respiratory systems: Secondary | ICD-10-CM | POA: Insufficient documentation

## 2011-09-05 DIAGNOSIS — R0602 Shortness of breath: Secondary | ICD-10-CM

## 2011-09-05 DIAGNOSIS — F1010 Alcohol abuse, uncomplicated: Secondary | ICD-10-CM | POA: Insufficient documentation

## 2011-09-05 DIAGNOSIS — R0609 Other forms of dyspnea: Secondary | ICD-10-CM | POA: Insufficient documentation

## 2011-09-05 NOTE — Progress Notes (Signed)
Echocardiogram performed.  

## 2011-09-19 ENCOUNTER — Encounter: Payer: Self-pay | Admitting: Gastroenterology

## 2011-09-19 ENCOUNTER — Ambulatory Visit (INDEPENDENT_AMBULATORY_CARE_PROVIDER_SITE_OTHER): Payer: Self-pay | Admitting: Gastroenterology

## 2011-09-19 VITALS — BP 132/82 | HR 90 | Ht 66.0 in | Wt 223.2 lb

## 2011-09-19 DIAGNOSIS — K219 Gastro-esophageal reflux disease without esophagitis: Secondary | ICD-10-CM

## 2011-09-19 DIAGNOSIS — Z1211 Encounter for screening for malignant neoplasm of colon: Secondary | ICD-10-CM

## 2011-09-19 MED ORDER — MOVIPREP 100 G PO SOLR: 1.0000 | ORAL | Status: DC

## 2011-09-19 NOTE — Progress Notes (Signed)
HPI: This is a   very pleasant 56 year old man whom I am meeting for the first time today  Has pyrosis, 3-4 months.  Burning pain in chest.  No acid taste in mouth.  Nausea after eating, intermittently.  Especially if laying down shortly after eating.  Drinks 6-7 caffinated bevs a day, rare etoh.  Overall weight stable.  No overt GI bleeding.  No dysphagia.    He was taking OTC omeprazole over the winter, then on prilosec for 3-4 months.  He takes them once a day.  Usually in AM,  Does not eat BF meal.  Lunch is a few hours later.  He has never had colon cancer screening that I can tell.  Blood work last month shows that he is not anemic   Review of systems: Pertinent positive and negative review of systems were noted in the above HPI section. Complete review of systems was performed and was otherwise normal.    Past Medical History  Diagnosis Date  . Hypertension   . GERD (gastroesophageal reflux disease)   . HLD (hyperlipidemia)     History reviewed. No pertinent past surgical history.  Current Outpatient Prescriptions  Medication Sig Dispense Refill  . albuterol (PROVENTIL HFA) 108 (90 BASE) MCG/ACT inhaler Inhale 2 puffs into the lungs every 6 (six) hours as needed for wheezing.  1 Inhaler  11  . aspirin (ASPIR-81) 81 MG EC tablet Take 81 mg by mouth daily.        Marland Kitchen atenolol (TENORMIN) 50 MG tablet Take 1 tablet (50 mg total) by mouth daily.  31 tablet  11  . cetirizine (ZYRTEC) 10 MG tablet Take 10 mg by mouth daily.      . ferrous sulfate 325 (65 FE) MG tablet Take 325 mg by mouth 2 (two) times daily.        Marland Kitchen omeprazole (PRILOSEC) 20 MG capsule Take 2 capsules (40 mg total) by mouth daily.  62 capsule  11  . zolpidem (AMBIEN) 5 MG tablet Take 1 tablet (5 mg total) by mouth at bedtime as needed for sleep.  30 tablet  3  . DISCONTD: cetirizine (ZYRTEC) 10 MG tablet Take 1 tablet (10 mg total) by mouth daily.  30 tablet  11    Allergies as of 09/19/2011  . (No Known  Allergies)    Family History  Problem Relation Age of Onset  . Cancer Mother   . Diabetes Father     History   Social History  . Marital Status: Legally Separated    Spouse Name: N/A    Number of Children: 3  . Years of Education: N/A   Occupational History  .  [employer]   Social History Main Topics  . Smoking status: Never Smoker   . Smokeless tobacco: Never Used  . Alcohol Use: Yes     Beer sometimes  . Drug Use: No  . Sexually Active: Not on file   Other Topics Concern  . Not on file   Social History Narrative   Financial assistance approved [facility] and [facility]       Physical Exam: BP 132/82  Pulse 90  Ht 5\' 6"  (1.676 m)  Wt 223 lb 3.2 oz (101.243 kg)  BMI 36.03 kg/m2 Constitutional: generally well-appearing Psychiatric: alert and oriented x3 Eyes: extraocular movements intact Mouth: oral pharynx moist, no lesions Neck: supple no lymphadenopathy Cardiovascular: heart regular rate and rhythm Lungs: clear to auscultation  bilaterally Abdomen: soft, nontender, nondistended, no obvious ascites, no peritoneal signs, normal bowel sounds Extremities: no lower extremity edema bilaterally Skin: no lesions on visible extremities    Assessment and plan: 56 y.o. male with  GERD, routine risk for colon cancer  He has several lifestyle issues that he can modify which will likely improve his GERD symptoms. He is not taking his proton pump inhibitor at the current time in relation to eating. I have recommended he change that. I also suggested adding H2 blocker at bedtime. We will proceed with EGD and at the same time colonoscopy for routine cancer screening.

## 2011-09-19 NOTE — Patient Instructions (Addendum)
GERD handout. Try cutting back on caffeine, this clearly worsens heartburn for some people. You should change the way you are taking your antiacid medicine (prilosec) so that you are taking it 20-30 minutes prior to a decent meal as that is the way the pill is designed to work most effectively. Laying down within 2-3 hours of eating will clearly cause GERD, acid issues. Try taking pepcid or zantac (OTC) at bedtime as well. You will be set up for an upper endoscopy for chronic GERD. You will be set up for a colonoscopy for routine cancer screening.  Gastroesophageal Reflux Disease, Adult Gastroesophageal reflux disease (GERD) happens when acid from your stomach flows up into the esophagus. When acid comes in contact with the esophagus, the acid causes soreness (inflammation) in the esophagus. Over time, GERD may create small holes (ulcers) in the lining of the esophagus. CAUSES   Increased body weight. This puts pressure on the stomach, making acid rise from the stomach into the esophagus.   Smoking. This increases acid production in the stomach.   Drinking alcohol. This causes decreased pressure in the lower esophageal sphincter (valve or ring of muscle between the esophagus and stomach), allowing acid from the stomach into the esophagus.   Late evening meals and a full stomach. This increases pressure and acid production in the stomach.   A malformed lower esophageal sphincter.  Sometimes, no cause is found. SYMPTOMS   Burning pain in the lower part of the mid-chest behind the breastbone and in the mid-stomach area. This may occur twice a week or more often.   Trouble swallowing.   Sore throat.   Dry cough.   Asthma-like symptoms including chest tightness, shortness of breath, or wheezing.  DIAGNOSIS  Your caregiver may be able to diagnose GERD based on your symptoms. In some cases, X-rays and other tests may be done to check for complications or to check the condition of your  stomach and esophagus. TREATMENT  Your caregiver may recommend over-the-counter or prescription medicines to help decrease acid production. Ask your caregiver before starting or adding any new medicines.  HOME CARE INSTRUCTIONS   Change the factors that you can control. Ask your caregiver for guidance concerning weight loss, quitting smoking, and alcohol consumption.   Avoid foods and drinks that make your symptoms worse, such as:   Caffeine or alcoholic drinks.   Chocolate.   Peppermint or mint flavorings.   Garlic and onions.   Spicy foods.   Citrus fruits, such as oranges, lemons, or limes.   Tomato-based foods such as sauce, chili, salsa, and pizza.   Fried and fatty foods.   Avoid lying down for the 3 hours prior to your bedtime or prior to taking a nap.   Eat small, frequent meals instead of large meals.   Wear loose-fitting clothing. Do not wear anything tight around your waist that causes pressure on your stomach.   Raise the head of your bed 6 to 8 inches with wood blocks to help you sleep. Extra pillows will not help.   Only take over-the-counter or prescription medicines for pain, discomfort, or fever as directed by your caregiver.   Do not take aspirin, ibuprofen, or other nonsteroidal anti-inflammatory drugs (NSAIDs).  SEEK IMMEDIATE MEDICAL CARE IF:   You have pain in your arms, neck, jaw, teeth, or back.   Your pain increases or changes in intensity or duration.   You develop nausea, vomiting, or sweating (diaphoresis).   You develop shortness of breath, or  you faint.   Your vomit is green, yellow, black, or looks like coffee grounds or blood.   Your stool is red, bloody, or black.  These symptoms could be signs of other problems, such as heart disease, gastric bleeding, or esophageal bleeding. MAKE SURE YOU:   Understand these instructions.   Will watch your condition.   Will get help right away if you are not doing well or get worse.  Document  Released: 12/20/2004 Document Revised: 03/01/2011 Document Reviewed: 09/29/2010 [facility] Patient Information 2012 [place], [place].

## 2011-09-21 ENCOUNTER — Ambulatory Visit (HOSPITAL_BASED_OUTPATIENT_CLINIC_OR_DEPARTMENT_OTHER): Payer: Self-pay

## 2011-09-21 ENCOUNTER — Other Ambulatory Visit: Payer: Self-pay | Admitting: Gastroenterology

## 2011-09-21 NOTE — Telephone Encounter (Signed)
Left message on machine to call back prep was sent to health dept need to ask what other issues there are I spoke with the pharmacy they do not carry Movi prep I will give the pt free sample he will pick up at the front desk  Pt returned call is aware

## 2011-09-24 ENCOUNTER — Telehealth: Payer: Self-pay | Admitting: Gastroenterology

## 2011-09-24 ENCOUNTER — Encounter: Payer: Self-pay | Admitting: Gastroenterology

## 2011-09-24 NOTE — Telephone Encounter (Signed)
Patient ate [place],  rice, and cabbage around 9:30 pm yesterday.  I asked him if he did his first bottle of prep at 5 pm yesterday and he said no.  I advised him that he would have to be rescheduled.  I then transferred him to 3rd floor [facility] to reschedule.

## 2011-09-24 NOTE — Telephone Encounter (Signed)
Yes, please charge him. That is an hour long time slot that is empty, could have been filled by numerous other patients.

## 2011-10-05 ENCOUNTER — Encounter (HOSPITAL_BASED_OUTPATIENT_CLINIC_OR_DEPARTMENT_OTHER): Payer: Self-pay

## 2011-10-28 ENCOUNTER — Ambulatory Visit (HOSPITAL_BASED_OUTPATIENT_CLINIC_OR_DEPARTMENT_OTHER): Payer: Self-pay

## 2011-10-31 ENCOUNTER — Ambulatory Visit (AMBULATORY_SURGERY_CENTER): Payer: Self-pay | Admitting: Gastroenterology

## 2011-10-31 ENCOUNTER — Encounter: Payer: Self-pay | Admitting: Gastroenterology

## 2011-10-31 VITALS — BP 148/85 | HR 77 | Temp 98.0°F | Resp 17 | Ht 66.0 in | Wt 223.0 lb

## 2011-10-31 DIAGNOSIS — K449 Diaphragmatic hernia without obstruction or gangrene: Secondary | ICD-10-CM

## 2011-10-31 DIAGNOSIS — D126 Benign neoplasm of colon, unspecified: Secondary | ICD-10-CM

## 2011-10-31 DIAGNOSIS — Z1211 Encounter for screening for malignant neoplasm of colon: Secondary | ICD-10-CM

## 2011-10-31 DIAGNOSIS — K219 Gastro-esophageal reflux disease without esophagitis: Secondary | ICD-10-CM

## 2011-10-31 MED ORDER — SODIUM CHLORIDE 0.9 % IV SOLN: 500.0000 mL | INTRAVENOUS | Status: DC

## 2011-10-31 NOTE — Patient Instructions (Addendum)
Discharge instructions given with verbal understanding. Handouts on polyps,diverticulosis and a hiatal hernia given. Resume previous medications. YOU HAD AN ENDOSCOPIC PROCEDURE TODAY AT THE [facility]: Refer to the procedure report that was given to you for any specific questions about what was found during the examination.  If the procedure report does not answer your questions, please call your gastroenterologist to clarify.  If you requested that your care partner not be given the details of your procedure findings, then the procedure report has been included in a sealed envelope for you to review at your convenience later.  YOU SHOULD EXPECT: Some feelings of bloating in the abdomen. Passage of more gas than usual.  Walking can help get rid of the air that was put into your GI tract during the procedure and reduce the bloating. If you had a lower endoscopy (such as a colonoscopy or flexible sigmoidoscopy) you may notice spotting of blood in your stool or on the toilet paper. If you underwent a bowel prep for your procedure, then you may not have a normal bowel movement for a few days.  DIET: Your first meal following the procedure should be a light meal and then it is ok to progress to your normal diet.  A half-sandwich or bowl of soup is an example of a good first meal.  Heavy or fried foods are harder to digest and may make you feel nauseous or bloated.  Likewise meals heavy in dairy and vegetables can cause extra gas to form and this can also increase the bloating.  Drink plenty of fluids but you should avoid alcoholic beverages for 24 hours.  ACTIVITY: Your care partner should take you home directly after the procedure.  You should plan to take it easy, moving slowly for the rest of the day.  You can resume normal activity the day after the procedure however you should NOT DRIVE or use heavy machinery for 24 hours (because of the sedation medicines used during the test).    SYMPTOMS  TO REPORT IMMEDIATELY: A gastroenterologist can be reached at any hour.  During normal business hours, 8:30 AM to 5:00 PM Monday through Friday, call [phone].  After hours and on weekends, please call the GI answering service at [phone] who will take a message and have the physician on call contact you.   Following lower endoscopy (colonoscopy or flexible sigmoidoscopy):  Excessive amounts of blood in the stool  Significant tenderness or worsening of abdominal pains  Swelling of the abdomen that is new, acute  Fever of 100F or higher  Following upper endoscopy (EGD)  Vomiting of blood or coffee ground material  New chest pain or pain under the shoulder blades  Painful or persistently difficult swallowing  New shortness of breath  Fever of 100F or higher  Black, tarry-looking stools  FOLLOW UP: If any biopsies were taken you will be contacted by phone or by letter within the next 1-3 weeks.  Call your gastroenterologist if you have not heard about the biopsies in 3 weeks.  Our staff will call the home number listed on your records the next business day following your procedure to check on you and address any questions or concerns that you may have at that time regarding the information given to you following your procedure. This is a courtesy call and so if there is no answer at the home number and we have not heard from you through the emergency physician on call, we will assume   that you have returned to your regular daily activities without incident.  SIGNATURES/CONFIDENTIALITY: You and/or your care partner have signed paperwork which will be entered into your electronic medical record.  These signatures attest to the fact that that the information above on your After Visit Summary has been reviewed and is understood.  Full responsibility of the confidentiality of this discharge information lies with you and/or your care-partner.  

## 2011-10-31 NOTE — Op Note (Signed)
[facility] [address] [place], [place]  [address]  ENDOSCOPY PROCEDURE REPORT  PATIENT:  [name], [name]  MR#:  [id number] BIRTHDATE:  January 21, 1956, 56 yrs. old  GENDER:  male ENDOSCOPIST:  Rachael Fee, MD PROCEDURE DATE:  10/31/2011 PROCEDURE:  EGD, diagnostic [phone] ASA CLASS:  Class II INDICATIONS:  GERD, recent minor hematemesis MEDICATIONS:  There was residual sedation effect present from prior procedure., These medications were titrated to patient response per physician's verbal order, Fentanyl 25 mcg IV, Versed 2 mg IV TOPICAL ANESTHETIC:  Cetacaine Spray  DESCRIPTION OF PROCEDURE:   After the risks benefits and alternatives of the procedure were thoroughly explained, informed consent was obtained.  The [facility] GIF-H180 [id number] endoscope was introduced through the mouth and advanced to the second portion of the duodenum, without limitations.  The instrument was slowly withdrawn as the mucosa was fully examined. <<PROCEDUREIMAGES>> A hiatal hernia was found. This was 2-3cm. No associated Cameron's erosions (see image4).  Otherwise the examination was normal (see image6, image5, image3, image1, and image2).    Retroflexed views revealed no abnormalities.    The scope was then withdrawn from the patient and the procedure completed. COMPLICATIONS:  None  ENDOSCOPIC IMPRESSION: 1) Small hiatal hernia 2) Otherwise normal examination  RECOMMENDATIONS: Continue cutting back on caffeine.  Continue once daily PPI (such as Nexium).  ______________________________ Rachael Fee, MD  n. eSIGNED:   Rachael Fee at 10/31/2011 02:56 PM  Jimmie Molly, Izora Gala, [phone]

## 2011-10-31 NOTE — Progress Notes (Addendum)
Pt states he drank at 12:25 p.m., a "whole bottle of water."  He ate "Off and on yesterday", had fish at 7:00 p.m. Last night  He states he drank his entire prep and his stool is clear  Dr. Christella Hartigan made aware.  Pt will be done after 2:25 P.M., 2 hours after the last time he drank.    Pt drank "1 cupful of white liquor last week and I threw up blood."  "Hunkis of blood came out of my mouth."

## 2011-10-31 NOTE — Op Note (Signed)
[facility] [address] [place], [place]  [address]  COLONOSCOPY PROCEDURE REPORT  PATIENT:  [name] [name], [name] [name]  MR#:  [id number] BIRTHDATE:  October 05, 1955, 56 yrs. old  GENDER:  male ENDOSCOPIST:  Rachael Fee, MD PROCEDURE DATE:  10/31/2011 PROCEDURE:  Colonoscopy with biopsy and snare polypectomy ASA CLASS:  Class II INDICATIONS:  Routine Risk Screening MEDICATIONS:   Fentanyl 50 mcg IV, These medications were titrated to patient response per physician's verbal order, Versed 6 mg IV  DESCRIPTION OF PROCEDURE:   After the risks benefits and alternatives of the procedure were thoroughly explained, informed consent was obtained.  Digital rectal exam was performed and revealed no rectal masses.   The [facility] [id number]-[id number] endoscope was introduced through the anus and advanced to the cecum, which was identified by both the appendix and ileocecal valve, without limitations.  The quality of the prep was good..  The instrument was then slowly withdrawn as the colon was fully examined. <<PROCEDUREIMAGES>> FINDINGS:  Two polyps were found. One ws 2mm across, sessile, ascending colon, removed with biopsy forceps, sent to pathology (jar 1). The other was 14mm, pedunculated, descending colon, removed with snare/cautery, sent to pathology (jar 2) (see image4, image7, and image11).  Mild diverticulosis was found in the sigmoid to descending colon segments.  This was otherwise a normal examination of the colon (see image12, image1, and image2). Retroflexed views in the rectum revealed no abnormalities. COMPLICATIONS:  None  ENDOSCOPIC IMPRESSION: 1) Two polyps, both were removed and both sent to pathology 2) Mild diverticulosis in the sigmoid to descending colon segments 3) Otherwise normal examination  RECOMMENDATIONS: 1) If the polyp(s) removed today are proven to be adenomatous (pre-cancerous) polyps, you will need a colonoscopy in 3 years. Otherwise you should continue to  follow colorectal cancer screening guidelines for "routine risk" patients with a colonoscopy in 10 years. You will receive a letter within 1-2 weeks with the results of your biopsy as well as final recommendations. Please call my office if you have not received a letter after 3 weeks.  ______________________________ Rachael Fee, MD  n. eSIGNED:   Rachael Fee at 10/31/2011 02:49 PM  [place], Izora Gala, [phone]

## 2011-10-31 NOTE — Progress Notes (Signed)
Patient did not experience any of the following events: a burn prior to discharge; a fall within the facility; wrong site/side/patient/procedure/implant event; or a hospital transfer or hospital admission upon discharge from the facility. (G8907) Patient did not have preoperative order for IV antibiotic SSI prophylaxis. (G8918)  

## 2011-11-01 ENCOUNTER — Telehealth: Payer: Self-pay | Admitting: *Deleted

## 2011-11-01 NOTE — Telephone Encounter (Signed)
Left message to call if questions or concerns.  

## 2011-11-06 ENCOUNTER — Encounter: Payer: Self-pay | Admitting: Gastroenterology

## 2011-11-08 ENCOUNTER — Ambulatory Visit (INDEPENDENT_AMBULATORY_CARE_PROVIDER_SITE_OTHER): Payer: Self-pay | Admitting: Internal Medicine

## 2011-11-08 VITALS — BP 136/94 | HR 81 | Temp 97.0°F | Ht 66.0 in | Wt 228.1 lb

## 2011-11-08 DIAGNOSIS — I8390 Asymptomatic varicose veins of unspecified lower extremity: Secondary | ICD-10-CM

## 2011-11-08 DIAGNOSIS — I8393 Asymptomatic varicose veins of bilateral lower extremities: Secondary | ICD-10-CM

## 2011-11-08 DIAGNOSIS — K5900 Constipation, unspecified: Secondary | ICD-10-CM

## 2011-11-08 DIAGNOSIS — G4700 Insomnia, unspecified: Secondary | ICD-10-CM

## 2011-11-08 DIAGNOSIS — Z0000 Encounter for general adult medical examination without abnormal findings: Secondary | ICD-10-CM

## 2011-11-08 DIAGNOSIS — M79606 Pain in leg, unspecified: Secondary | ICD-10-CM

## 2011-11-08 DIAGNOSIS — M79609 Pain in unspecified limb: Secondary | ICD-10-CM

## 2011-11-08 DIAGNOSIS — R12 Heartburn: Secondary | ICD-10-CM

## 2011-11-08 DIAGNOSIS — R0989 Other specified symptoms and signs involving the circulatory and respiratory systems: Secondary | ICD-10-CM

## 2011-11-08 DIAGNOSIS — R0609 Other forms of dyspnea: Secondary | ICD-10-CM

## 2011-11-08 DIAGNOSIS — G4733 Obstructive sleep apnea (adult) (pediatric): Secondary | ICD-10-CM

## 2011-11-08 DIAGNOSIS — K219 Gastro-esophageal reflux disease without esophagitis: Secondary | ICD-10-CM

## 2011-11-08 DIAGNOSIS — E785 Hyperlipidemia, unspecified: Secondary | ICD-10-CM

## 2011-11-08 DIAGNOSIS — R079 Chest pain, unspecified: Secondary | ICD-10-CM

## 2011-11-08 DIAGNOSIS — I10 Essential (primary) hypertension: Secondary | ICD-10-CM

## 2011-11-08 DIAGNOSIS — M79651 Pain in right thigh: Secondary | ICD-10-CM

## 2011-11-08 DIAGNOSIS — K449 Diaphragmatic hernia without obstruction or gangrene: Secondary | ICD-10-CM

## 2011-11-08 LAB — D-DIMER, QUANTITATIVE: D-Dimer, Quant: <0.22 ug/mL-FEU (ref 0.00–0.48)

## 2011-11-08 MED ORDER — POLYETHYLENE GLYCOL 3350 17 GM/SCOOP PO POWD: 17.0000 g | Freq: Every day | ORAL | Status: AC

## 2011-11-08 MED ORDER — POLYETHYLENE GLYCOL 3350 17 GM/SCOOP PO POWD: 17.0000 g | Freq: Every day | ORAL | Status: DC

## 2011-11-08 NOTE — Patient Instructions (Addendum)
Follow up in 1-2 months    Varicose Veins Varicose veins are veins that have become enlarged and twisted. CAUSES This condition is the result of valves in the veins not working properly. Valves in the veins help return blood from the leg to the heart. If these valves are damaged, blood flows backwards and backs up into the veins in the leg near the skin. This causes the veins to become larger. People who are on their feet a lot, who are pregnant, or who are overweight are more likely to develop varicose veins. SYMPTOMS   Bulging, twisted-appearing, bluish veins, most commonly found on the legs.   Leg pain or a feeling of heaviness. These symptoms may be worse at the end of the day.   Leg swelling.   Skin color changes.  DIAGNOSIS  Varicose veins can usually be diagnosed with an exam of your legs by your caregiver. He or she may recommend an ultrasound of your leg veins. TREATMENT  Most varicose veins can be treated at home.However, other treatments are available for people who have persistent symptoms or who want to treat the cosmetic appearance of the varicose veins. These include:  Laser treatment of very small varicose veins.   Medicine that is shot (injected) into the vein. This medicine hardens the walls of the vein and closes off the vein. This treatment is called sclerotherapy. Afterwards, you may need to wear clothing or bandages that apply pressure.   Surgery.  HOME CARE INSTRUCTIONS   Do not stand or sit in one position for long periods of time. Do not sit with your legs crossed. Rest with your legs raised during the day.   Wear elastic stockings or support hose. Do not wear other tight, encircling garments around the legs, pelvis, or waist.   Walk as much as possible to increase blood flow.   Raise the foot of your bed at night with 2-inch blocks.   If you get a cut in the skin over the vein and the vein bleeds, lie down with your leg raised and press on it with a  clean cloth until the bleeding stops. Then place a bandage (dressing) on the cut. See your caregiver if it continues to bleed or needs stitches.  SEEK MEDICAL CARE IF:   The skin around your ankle starts to break down.   You have pain, redness, tenderness, or hard swelling developing in your leg over a vein.   You are uncomfortable due to leg pain.  Document Released: 12/20/2004 Document Revised: 03/01/2011 Document Reviewed: 05/08/2010 [facility] Patient Information 2012 [place], [place].  Heartburn Heartburn is a painful, burning feeling in the chest. It may feel worse when you lie down or bend over. Heartburn is caused by stomach acid moving into the tube that carries food from the mouth to the stomach (esophagus). HOME CARE  Take all medicine as told by your doctor.   Raise the head of your bed with blocks only as told by your doctor.   Do not exercise right after eating.   Avoid eating 2 or 3 hours before bed. Do not lie down right after eating.   Eat small meals throughout the day instead of 3 large meals.   Stop smoking if you smoke.   Keep up a healthy weight.   Avoid foods that give you heartburn. Foods you may want to avoid include:   Peppers.   Chocolate.   High-fat foods, including fried foods.   Spicy foods.   Garlic  and onions.   Citrus fruits, including oranges, grapefruit, lemons, and limes.   Food containing tomatoes or tomato products.   Mint.   Bubbly (carbonated) drinks and drinks with caffeine.   Vinegar.  GET HELP RIGHT AWAY IF:  You have bad chest pain that goes down your arm or into your jaw or neck.   You feel sweaty, dizzy, or lightheaded.   You have trouble breathing.   You throw up (vomit) blood.   You have trouble or pain when swallowing.   You have bloody or black poop (stool).   You have heartburn more than 3 times a week, for more than 2 weeks.  MAKE SURE YOU:  Understand these instructions.   Will watch your condition.    Will get help right away if you are not doing well or get worse.  Document Released: 11/22/2010 Document Revised: 03/01/2011 Document Reviewed: 11/22/2010 [facility] Patient Information 2012 [employer], [place].  Sleep Apnea Sleep apnea is when a person's sleep is disrupted many times. This makes a person tired during the day. There are two types of sleep apnea. One is when breathing stops for a short time because your airway is blocked (obstructive sleep apnea). The other type is when the brain sometimes fails to give the normal signal to breathe (central sleep apnea).  HOME CARE  Do not sleep on your back.   Take all medicine as told by your doctor.   Avoid alcohol, calming medicines (sedatives), and depressant drugs.   Use your CPAP (a mask-like machine and pump) to keep your airway open while sleeping.   If you are overweight, try to lose some weight. Talk to your doctor about a healthy weight goal.   If the problem is related to heart failure, take medicines as told by your doctor.  MAKE SURE YOU:  Understand these instructions.   Will watch your condition.   Will get help right away if you are not doing well or get worse.  Document Released: 12/20/2007 Document Revised: 03/01/2011 Document Reviewed: 12/20/2007 [facility] Patient Information 2012 [place], [place].

## 2011-11-09 LAB — COMPLETE METABOLIC PANEL WITH GFR
ALT: 20 U/L (ref 0–53)
CO2: 29 mEq/L (ref 19–32)
Calcium: 9.7 mg/dL (ref 8.4–10.5)
Chloride: 104 mEq/L (ref 96–112)
GFR, Est African American: 88 mL/min
Sodium: 142 mEq/L (ref 135–145)
Total Protein: 7.8 g/dL (ref 6.0–8.3)

## 2011-11-09 LAB — LIPID PANEL: LDL Cholesterol: 124 mg/dL — ABNORMAL HIGH (ref 0–99)

## 2011-11-11 ENCOUNTER — Encounter: Payer: Self-pay | Admitting: Internal Medicine

## 2011-11-11 DIAGNOSIS — Z0000 Encounter for general adult medical examination without abnormal findings: Secondary | ICD-10-CM | POA: Insufficient documentation

## 2011-11-11 NOTE — Assessment & Plan Note (Addendum)
See comments under OSA for 11/08/11 visit Pt is not taking [place]

## 2011-11-11 NOTE — Assessment & Plan Note (Signed)
Associated with intermittent chest pain appears chronic per notes Etiology could be cardiac vs noncardiac vs other 08/2011 echo with mild concentric hypertrophy EF 55-60%, LA mildly dilated Referred for nuclear stress test Checked d dimer to r/o pulm etiology and d dimer was not elevated.

## 2011-11-11 NOTE — Assessment & Plan Note (Addendum)
-  Severe -05/05/2011 appt with Dr. Maple Hudson showed insomnia with severe sleep apnea/hypoapnea syndrome with desats of 86% and mean O2 sat of 95% -It was thought b/c of his inability to sustain sleep he was unable to meet protocol requirements for a trial of cpap by split protocol on this study night. He needs to return for cpap titration study bringing a sleep medication if appropriate -Also at that appt he indicated that he wakes at home with choking and sometimes vomiting. -He saw Dr. 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 Maple Hudson for further sleep study -Advised pt to revisit Dr. Maple Hudson

## 2011-11-11 NOTE — Assessment & Plan Note (Addendum)
bp slightly elevated this visit 136/94 Will monitor bp at fu visits Pt to cont atenolol 50 mg qd

## 2011-11-11 NOTE — Assessment & Plan Note (Signed)
Lipid Panel     Component Value Date/Time   CHOL 201* 11/08/2011 1632   TRIG 198* 11/08/2011 1632   HDL 37* 11/08/2011 1632   CHOLHDL 5.4 11/08/2011 1632   VLDL 40 11/08/2011 1632   LDLCALC 124* 11/08/2011 1632   ldl not at goal  Will inform pt about diet and exercise modifications  Pt may need to be on a statin in the future

## 2011-11-11 NOTE — Assessment & Plan Note (Signed)
Worse right lower ext>left lower ext causing pain, decreased adls and ability to work  Plan Will refer to [facility] vascular surgery for further tx pt failed compression stocking

## 2011-11-11 NOTE — Assessment & Plan Note (Signed)
Pt c/o right thigh pain at todays visit which seems chronic and may be related to his varicosities

## 2011-11-11 NOTE — Progress Notes (Addendum)
Subjective:    Patient ID: [name] [name], male    DOB: Jul 31, 1955, 56 y.o.   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  Patient presented to clinic 11/08/11 for worsening varicose veins right lower leg > left. Pain is 6/10 and radiates his right thigh. His varicose veins have been worsening x 2 years. The sensation is aching. He notices increased swelling in the area of the varicosities. He has tried Aspirin 2 tablets today without relief today. He also complains of his left leg aching at times. 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   He has severe OSA associated with insomnia 04/2011. He needs to go back to follow up with Dr. Maple Hudson. He needs to have a sleep study performed at night to complete his work up in order to get his settings for his home cpap machine.   He reports constipation and problems with heartburn, which he takes Prilosec. He has seen Dr. Gerilyn Pilgrim in the past who recommended he take pepcid or zantac at night.   He recently had a colonoscopy and upper endoscopy 10/31/11 and reports he was throwing up blood last week after the procedure.    He has previously noted sob at rest and with exertion. He has an Albuterol inhaler that he intermittently uses.    He reports right to left radiation of chest pain, 8/10 today progressive for one week. He reports intermittent palpitations.   SH: lives with a friend; from [place]; related to [employer] and Mamie Levers  Leg Pain  The incident occurred more than 1 week ago. The pain is present in the right leg. The quality of the pain is described as aching. The pain is at a severity of 6/10. The pain has been worsening since onset.      Review of Systems  Constitutional: Positive for unexpected weight change.       +weight gain +decreased energy  Respiratory:  Positive for cough and shortness of breath.        +cough with white phlegm on and off   Cardiovascular: Positive for chest pain, palpitations and leg swelling.  Gastrointestinal:       +vomiting blood x 1 week ago Denies n/v today       Objective:   Physical Exam  Nursing note and vitals reviewed. Constitutional: He is oriented to person, place, and time. He appears well-developed and well-nourished. He is cooperative. No distress.       pleasant  HENT:  Head: Normocephalic and atraumatic.  Mouth/Throat: Oropharynx is clear and moist and mucous membranes are normal. No oropharyngeal exudate.  Eyes: Conjunctivae are normal. Pupils are equal, round, and reactive to light. No scleral icterus.  Cardiovascular: Normal rate, regular rhythm, S1 normal, S2 normal and normal heart sounds.  Exam reveals no gallop and no friction rub.   No murmur heard.        No jvd b/l  Pulmonary/Chest: Effort normal and breath sounds normal. No respiratory distress. He has no wheezes.  Abdominal: Soft. Bowel sounds are normal. He exhibits no distension. There is tenderness.         Obese ab  Neurological: He is alert and oriented to person, place, and time.  Skin: Skin is warm, dry and intact. No rash noted. He is not diaphoretic.  Left lower ext with varicosities but not as bulging as the right lower leg   Psychiatric: He has a normal mood and affect. His speech is normal and behavior is normal. Cognition and memory are normal.          Assessment & Plan:  INTERNAL MEDICINE TEACHING ATTENDING ADDENDUM - Lars Mage, MD: I personally saw and evaluated Mr Minish in this clinic visit in conjunction with the resident, Dr. Shirlee Latch. I have discussed the patient's plan of care with Dr. Shirlee Latch during this visit. I have confirmed the physical exam findings and have read and agree with the clinic note including the plan.

## 2011-11-11 NOTE — Assessment & Plan Note (Signed)
rx miralax prn

## 2011-11-12 ENCOUNTER — Encounter: Payer: Self-pay | Admitting: Internal Medicine

## 2011-11-12 ENCOUNTER — Telehealth: Payer: Self-pay | Admitting: *Deleted

## 2011-11-12 NOTE — Telephone Encounter (Signed)
Call to Sleep Center pt has missed 2-3 rescheduled appointments for the Sleep Study.  Pt was rescheduled for 12/02/2011 at 8:00 PM at the Sleep Center.  Pt was asked if he had gotten his Ambien for sleep.  Pt has not yet picked up the medication.  Suggestion to pt to pick up medication prior to the Sleep Study if possible.  Pt was also informed that he would be unable to get a CPAP machine unless the Sleep  Study is done.  Pt voiced understanding of.  Angelina Ok, RN 11/12/2011 3:20 PM.

## 2011-11-12 NOTE — Telephone Encounter (Signed)
Pt and friend tsalters call for pt to speak w/ gladys about sleep study, message is given to gladys verbally.

## 2011-11-13 NOTE — Addendum Note (Signed)
Addended by: Annett Gula on: 11/13/2011 12:02 PM   Modules accepted: Orders

## 2011-11-16 ENCOUNTER — Ambulatory Visit (HOSPITAL_COMMUNITY)
Admission: RE | Admit: 2011-11-16 | Discharge: 2011-11-16 | Disposition: A | Discharge status: Home / Self Care | Payer: Self-pay | Source: Ambulatory Visit | Attending: Internal Medicine | Admitting: Internal Medicine

## 2011-11-16 DIAGNOSIS — I8393 Asymptomatic varicose veins of bilateral lower extremities: Secondary | ICD-10-CM

## 2011-11-16 DIAGNOSIS — M79609 Pain in unspecified limb: Secondary | ICD-10-CM | POA: Insufficient documentation

## 2011-11-16 DIAGNOSIS — I8390 Asymptomatic varicose veins of unspecified lower extremity: Secondary | ICD-10-CM | POA: Insufficient documentation

## 2011-11-16 DIAGNOSIS — M79651 Pain in right thigh: Secondary | ICD-10-CM

## 2011-11-16 NOTE — Progress Notes (Signed)
*  PRELIMINARY RESULTS* Vascular Ultrasound Lower extremity venous duplex has been completed.  Preliminary findings: Bilaterally no evidence of DVT. Multiple large varicose veins seen in right lower leg.  Attempted to call report to Dr. Shirlee Latch pager # [phone]. No return call.  Farrel Demark, RDMS, RVT  11/16/2011, 11:41 AM

## 2011-11-17 ENCOUNTER — Encounter: Payer: Self-pay | Admitting: Internal Medicine

## 2011-11-19 DIAGNOSIS — E669 Obesity, unspecified: Secondary | ICD-10-CM | POA: Insufficient documentation

## 2011-11-21 ENCOUNTER — Encounter: Payer: Self-pay | Admitting: Nurse Practitioner

## 2011-12-02 ENCOUNTER — Ambulatory Visit (HOSPITAL_BASED_OUTPATIENT_CLINIC_OR_DEPARTMENT_OTHER): Payer: Self-pay | Attending: Internal Medicine

## 2011-12-02 VITALS — Ht 66.0 in | Wt 228.0 lb

## 2011-12-02 DIAGNOSIS — G4710 Hypersomnia, unspecified: Secondary | ICD-10-CM | POA: Insufficient documentation

## 2011-12-02 DIAGNOSIS — G4733 Obstructive sleep apnea (adult) (pediatric): Secondary | ICD-10-CM

## 2011-12-04 ENCOUNTER — Other Ambulatory Visit: Payer: Self-pay | Admitting: *Deleted

## 2011-12-04 MED ORDER — CETIRIZINE HCL 10 MG PO TABS: 10.0000 mg | ORAL_TABLET | Freq: Every day | ORAL | Status: DC

## 2011-12-08 DIAGNOSIS — G4730 Sleep apnea, unspecified: Secondary | ICD-10-CM

## 2011-12-08 DIAGNOSIS — G4710 Hypersomnia, unspecified: Secondary | ICD-10-CM

## 2011-12-09 NOTE — Procedures (Signed)
[name] [name], RHEW NO.:  [id number]  MEDICAL RECORD NO.:  [id number]          PATIENT TYPE:  OUT  LOCATION:  SLEEP CENTER                 FACILITY[facility]  [facility]  PHYSICIAN:  Clinton D. Maple Hudson, MD, FCCP, FACPDATE OF BIRTH:  [birth year] 04, 1956  DATE OF STUDY:  12/02/2011                           NOCTURNAL POLYSOMNOGRAM  REFERRING PHYSICIAN:  Quentin Ore, MD  INDICATION FOR STUDY:  Hypersomnia with sleep apnea.  EPWORTH SLEEPINESS SCORE:  13/24.  BMI 36.8, weight 228 pounds, height 6 feet 6 inches, neck 16.5 inches.  MEDICATIONS:  Home medications are charted and reviewed.  A baseline diagnostic NPSG on May 15, 2011, had recorded AHI 48.1 per hour.  Body weight was 226 pounds.  CPAP titration is now requested.  SLEEP ARCHITECTURE:  Total sleep time 137.5 minutes with sleep efficiency 72%.  Stage I was 12.4%, stage II 71.6%, stage III 1.5%, REM 14.5% of total sleep time.  Sleep latency 12.5 minutes, REM latency 63.5 minutes, awake after sleep onset 27.5 minutes, arousal index 23.1.  Bedtime Medication:  None.  RESPIRATORY DATA:  CPAP titration protocol.  CPAP was titrated to 12 CWP, AHI 80 per hour.  Best control was at 10 CWP with AHI of 3.2 per hour.  The patient terminated the study at 2:30 a.m. before completion of the titration.  He wore a standard ResMed Mirage FX nasal mask with heated humidifier.  OXYGEN DATA:  Moderate snoring with mean oxygen saturation through the study of 94.2% on room air.  CARDIAC DATA:  Normal sinus rhythm.  MOVEMENT-PARASOMNIA:  Occasional limb jerk with little effect on sleep. No bathroom trips.  IMPRESSIONS-RECOMMENDATIONS: 1. The patient terminated the study at about 2:30 a.m. before     completion of final titration. 2. During recording time, CPAP was titrated to a best pressure of 10     CWP, AHI 3.2 per hour.  There were a few breakthrough events still     being noted at pressures up to 12 CWP.  He wore a standard  ResMed     Mirage FX nasal mask with heated humidifier.  Suggest initial home     trial at 10 CWP. 3. Baseline diagnostic NPSG at May 15, 2011, had recorded AHI of     48.1 per hour.  Body weight was 226 pounds for that study.     Clinton D. Maple Hudson, MD, [facility], FACP Diplomate, American Board of Sleep Medicine    CDY/MEDQ  D:  12/08/2011 09:35:24  T:  12/09/2011 04:16:40  Job:  [id number]

## 2011-12-28 ENCOUNTER — Encounter: Payer: Self-pay | Admitting: Licensed Clinical Social Worker

## 2011-12-28 ENCOUNTER — Other Ambulatory Visit: Payer: Self-pay | Admitting: *Deleted

## 2011-12-28 DIAGNOSIS — K219 Gastro-esophageal reflux disease without esophagitis: Secondary | ICD-10-CM

## 2011-12-28 NOTE — Progress Notes (Signed)
[name] presents as a walk-in to see CSW.  [name] requesting information on applying for disability.  Pt has orange card and works PT.  CSW provided [name] with information on applying for disability SSDI and length process.  Provided CSW contact information.  Pt denies add'l needs at this time.

## 2012-01-03 MED ORDER — OMEPRAZOLE 20 MG PO CPDR: 40.0000 mg | DELAYED_RELEASE_CAPSULE | Freq: Every day | ORAL | Status: DC

## 2012-01-03 NOTE — Addendum Note (Signed)
Addended by: Annett Gula on: 01/03/2012 07:23 PM   Modules accepted: Orders

## 2012-01-07 ENCOUNTER — Telehealth: Payer: Self-pay | Admitting: *Deleted

## 2012-01-07 NOTE — Telephone Encounter (Signed)
Prescription for Omeprazole 40 mg tablets 1 po daily dispense #30 with 5 refills was called to the [facility] per order of Dr. Shirlee Latch.  Angelina Ok, RN 01/07/2012 3:29 PM.

## 2012-02-05 ENCOUNTER — Encounter: Payer: Self-pay | Admitting: Internal Medicine

## 2012-02-05 ENCOUNTER — Encounter: Payer: Self-pay | Admitting: Licensed Clinical Social Worker

## 2012-02-05 ENCOUNTER — Ambulatory Visit (INDEPENDENT_AMBULATORY_CARE_PROVIDER_SITE_OTHER): Payer: Self-pay | Admitting: Internal Medicine

## 2012-02-05 VITALS — BP 147/100 | HR 60 | Temp 97.4°F | Ht 66.0 in | Wt 231.0 lb

## 2012-02-05 DIAGNOSIS — Z0000 Encounter for general adult medical examination without abnormal findings: Secondary | ICD-10-CM

## 2012-02-05 DIAGNOSIS — E785 Hyperlipidemia, unspecified: Secondary | ICD-10-CM

## 2012-02-05 DIAGNOSIS — G4733 Obstructive sleep apnea (adult) (pediatric): Secondary | ICD-10-CM

## 2012-02-05 DIAGNOSIS — E669 Obesity, unspecified: Secondary | ICD-10-CM

## 2012-02-05 DIAGNOSIS — Z23 Encounter for immunization: Secondary | ICD-10-CM

## 2012-02-05 DIAGNOSIS — I10 Essential (primary) hypertension: Secondary | ICD-10-CM

## 2012-02-05 DIAGNOSIS — Z79899 Other long term (current) drug therapy: Secondary | ICD-10-CM

## 2012-02-05 DIAGNOSIS — I8390 Asymptomatic varicose veins of unspecified lower extremity: Secondary | ICD-10-CM

## 2012-02-05 LAB — POCT GLYCOSYLATED HEMOGLOBIN (HGB A1C): Hemoglobin A1C: 5.4

## 2012-02-05 LAB — GLUCOSE, CAPILLARY: Glucose-Capillary: 72 mg/dL (ref 70–99)

## 2012-02-05 MED ORDER — LISINOPRIL-HYDROCHLOROTHIAZIDE 10-12.5 MG PO TABS: 1.0000 | ORAL_TABLET | Freq: Every day | ORAL | Status: DC

## 2012-02-05 NOTE — Assessment & Plan Note (Addendum)
Will get records from [facility] Patient unable to afford compression stockings which are highly recommended for his engorged painful varicosities Advise to take Tylenol prn pain Will fit for TED hose at f/u in 1 month

## 2012-02-05 NOTE — Assessment & Plan Note (Signed)
BP elevated today Pt was taking Atenolol 50 mg qd which alone in African Americans does not effectively tx BP Changed to Lisinopril 10-HCTZ 12.5 mg.   Will not keep BB due to patient stating that his hands turn which in the cold which may be a Raynauds phenomenon

## 2012-02-05 NOTE — Patient Instructions (Signed)
Follow up for BP in 1 month.  Pick up your new Blood pressure medication

## 2012-02-05 NOTE — Assessment & Plan Note (Signed)
Reviewed cholesterol and given information Discussed diet and exercise modifications prior to starting tx  Lipid Panel     Component Value Date/Time   CHOL 201* 11/08/2011 1632   TRIG 198* 11/08/2011 1632   HDL 37* 11/08/2011 1632   CHOLHDL 5.4 11/08/2011 1632   VLDL 40 11/08/2011 1632   LDLCALC 124* 11/08/2011 1632   Per ATP risk calc. 10/100 people are at risk for MI in the next 10 years

## 2012-02-05 NOTE — Assessment & Plan Note (Addendum)
Unable to tolerate sleep study for the 2nd time.   Attempted to order nasal mask for CWP 10 per Pulmonary recommendations at night but unless the patient has insurance (which he does not, he is not able to get a machine) Will reassess if gets insurance in the future
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
## 2012-02-05 NOTE — Assessment & Plan Note (Addendum)
Given Flu shot today.  Will give Tdap in the future  Colonoscopy reviewed benign polyps

## 2012-02-05 NOTE — Addendum Note (Signed)
Addended by: Annett Gula on: 02/05/2012 02:27 PM   Modules accepted: Orders

## 2012-02-11 NOTE — Progress Notes (Signed)
agree

## 2012-02-15 NOTE — Progress Notes (Signed)
Mr. Mcmorran was referred to CSW for affordable exercise programs.  CSW met with Mr. Calcagni following his scheduled [facility] appt.  Pt states he has applied for disability and was referred to a disability physician.  CSW provided Mr. Fetterolf with listing of Fall Prevention resources which has Tai Chi listed as a free/donation program.  Pt denies add'l needs at this time.  CSW will sign off.

## 2012-03-04 ENCOUNTER — Telehealth: Payer: Self-pay | Admitting: Licensed Clinical Social Worker

## 2012-03-04 NOTE — Telephone Encounter (Signed)
CSW received voice mail from [name] [name] stating he was denied for disability. Message unclear is pt wanted CSW to provide names for attorneys. CSW returned call to pt, left message with contact information and hours.  CSW will refer [name] [name] to legal aid for referral.

## 2012-03-05 NOTE — Telephone Encounter (Signed)
"  They say I make too much money".  Pt states he was denied disability based on income.  CSW inquired if denial letter only stated income or diagnosis.  Pt states he didn't.  CSW informed Mr. Kittel there is a number on the second page to request a listing of representatives to help with the appeal process.  Pt states he didn't read the letter.  CSW offered Mr. Rocchi the number to legal aid or listing of representative/attorneys from others in the office, however CSW could not recommend any.  Pt states he will be in the office this week. CSW provided pt with hours available.

## 2012-03-13 ENCOUNTER — Encounter: Payer: Self-pay | Admitting: Internal Medicine

## 2012-03-13 ENCOUNTER — Ambulatory Visit (INDEPENDENT_AMBULATORY_CARE_PROVIDER_SITE_OTHER): Payer: No Typology Code available for payment source | Admitting: Internal Medicine

## 2012-03-13 VITALS — BP 132/90 | HR 99 | Temp 98.6°F | Ht 66.0 in | Wt 229.9 lb

## 2012-03-13 DIAGNOSIS — I8390 Asymptomatic varicose veins of unspecified lower extremity: Secondary | ICD-10-CM

## 2012-03-13 DIAGNOSIS — I10 Essential (primary) hypertension: Secondary | ICD-10-CM

## 2012-03-13 DIAGNOSIS — G4733 Obstructive sleep apnea (adult) (pediatric): Secondary | ICD-10-CM

## 2012-03-13 NOTE — Assessment & Plan Note (Signed)
Patient not able to afford cpap and he does not have insurance to cover machine

## 2012-03-13 NOTE — Assessment & Plan Note (Signed)
BP improved since switched Atenolol to Lisinopril-HCTZ 10-12.5 mg daily

## 2012-03-13 NOTE — Assessment & Plan Note (Addendum)
He has not been able to get compression hose due to affordability.  [facility] will try to get hose for him after the first of the year.  He states walking makes his leg pain worse.  I referred him to [facility] surgery and reviewed the records from Dr. Eben Burow who is affiliated with Vascular surgery.  He recommends thigh high compression hose with 20-30 mmHg.  He was measured today for hose in the future.  He may need further f/u at [facility]  Filled out handicap sticker for patient today

## 2012-03-13 NOTE — Patient Instructions (Addendum)
General Instructions:Bring medications to each visit     Treatment Goals:  Goals (1 Years of Data) as of 03/13/2012    None  <140/90    Progress Toward Treatment Goals:  Treatment Goal 03/13/2012  Blood pressure at goal    Self Care Goals & Plans:  Self Care Goal 03/13/2012  Manage my medications take my medicines as prescribed; refill my medications on time  Eat healthy foods eat more vegetables; eat foods that are low in salt; drink diet soda or water instead of juice or soda  Be physically active park at the far end of the parking lot       Care Management & Community Referrals:  Referral 03/13/2012  Referrals made for care management support social worker  Referrals made to community resources (No Data)       Hypertension Hypertension is another name for high blood pressure. High blood pressure may mean that your heart needs to work harder to pump blood. Blood pressure consists of two numbers, which includes a higher number over a lower number (example: 110/72). HOME CARE   Make lifestyle changes as told by your doctor. This may include weight loss and exercise.  Take your blood pressure medicine every day.  Limit how much salt you use.  Stop smoking if you smoke.  Do not use drugs.  Talk to your doctor if you are using decongestants or birth control pills. These medicines might make blood pressure higher.  Females should not drink more than 1 alcoholic drink per day. Males should not drink more than 2 alcoholic drinks per day.  See your doctor as told. GET HELP RIGHT AWAY IF:   You have a blood pressure reading with a top number of 180 or higher.  You get a very bad headache.  You get blurred or changing vision.  You feel confused.  You feel weak, numb, or faint.  You get chest or belly (abdominal) pain.  You throw up (vomit).  You cannot breathe very well. MAKE SURE YOU:   Understand these instructions.  Will watch your  condition.  Will get help right away if you are not doing well or get worse. Document Released: 08/29/2007 Document Revised: 06/04/2011 Document Reviewed: 08/29/2007 [facility] Patient Information 2013 [place], [place].  Varicose Veins Varicose veins are veins that have become enlarged and twisted. CAUSES This condition is the result of valves in the veins not working properly. Valves in the veins help return blood from the leg to the heart. If these valves are damaged, blood flows backwards and backs up into the veins in the leg near the skin. This causes the veins to become larger. People who are on their feet a lot, who are pregnant, or who are overweight are more likely to develop varicose veins. SYMPTOMS   Bulging, twisted-appearing, bluish veins, most commonly found on the legs.  Leg pain or a feeling of heaviness. These symptoms may be worse at the end of the day.  Leg swelling.  Skin color changes. DIAGNOSIS  Varicose veins can usually be diagnosed with an exam of your legs by your caregiver. He or she may recommend an ultrasound of your leg veins. TREATMENT  Most varicose veins can be treated at home.However, other treatments are available for people who have persistent symptoms or who want to treat the cosmetic appearance of the varicose veins. These include:  Laser treatment of very small varicose veins.  Medicine that is shot (injected) into the vein. This medicine hardens the  walls of the vein and closes off the vein. This treatment is called sclerotherapy. Afterwards, you may need to wear clothing or bandages that apply pressure.  Surgery. HOME CARE INSTRUCTIONS   Do not stand or sit in one position for long periods of time. Do not sit with your legs crossed. Rest with your legs raised during the day.  Wear elastic stockings or support hose. Do not wear other tight, encircling garments around the legs, pelvis, or waist.  Walk as much as possible to increase blood  flow.  Raise the foot of your bed at night with 2-inch blocks.  If you get a cut in the skin over the vein and the vein bleeds, lie down with your leg raised and press on it with a clean cloth until the bleeding stops. Then place a bandage (dressing) on the cut. See your caregiver if it continues to bleed or needs stitches. SEEK MEDICAL CARE IF:   The skin around your ankle starts to break down.  You have pain, redness, tenderness, or hard swelling developing in your leg over a vein.  You are uncomfortable due to leg pain. Document Released: 12/20/2004 Document Revised: 06/04/2011 Document Reviewed: 05/08/2010 [facility] Patient Information 2013 [place], [place].

## 2012-03-13 NOTE — Addendum Note (Signed)
Addended by: Annett Gula on: 03/13/2012 06:36 PM   Modules accepted: Orders

## 2012-03-13 NOTE — Progress Notes (Signed)
  Subjective:    Patient ID: [name] [name], male    DOB: 1955/12/01, 56 y.o.   MRN: [id number]  HPI Comments: 56 y.o male PMH HTN, varicose veins, etc.  Presents for follow up for severe varicose veins.  He has not been able to get compression hose due to affordability.  He states walking makes his leg pain worse.  I referred him to [facility] surgery and reviewed the records from Dr. Eben Burow who is affiliated with Vascular surgery.  He recommends thigh high compression hose with 20-30 mmHg. He wanted to see the patient in 6 months from visit and states he may need further intervention for his severe varicosities.    He applied for disability but was denied.  He also has OSA and has had 2 sleep studies performed with recommendation of CPAP 10 CWP.  He does not have the machine because he does not have insurance.  He requests a handicap sticker because walking long distances in the parking lot at [facility] cause him significant leg pain.  He brings in his mother's death certificate and she died from uterine cancer at the age of 31.  He was a young child and reared by his grandparents.    Leg Pain  The incident occurred more than 1 week ago. There was no injury mechanism. The pain is present in the left leg and right leg. The quality of the pain is described as aching. The pain has been intermittent since onset. He reports no foreign bodies present. The symptoms are aggravated by movement. He has tried nothing for the symptoms. The treatment provided no relief.  Shortness of Breath Associated symptoms include abdominal pain and leg pain. Pertinent negatives include no chest pain.      Review of Systems  Respiratory: Positive for shortness of breath.        +sob with exertion  Cardiovascular: Negative for chest pain.  Gastrointestinal: Positive for abdominal pain and constipation.       Mild epigastric and RUQ abdominal pain  Musculoskeletal:       B/l lower extremity pain worse with  waling       Objective:   Physical Exam  Nursing note and vitals reviewed. Constitutional: He is oriented to person, place, and time. Vital signs are normal. He appears well-developed and well-nourished. He is cooperative. No distress.  HENT:  Head: Normocephalic and atraumatic.  Mouth/Throat: Oropharynx is clear and moist and mucous membranes are normal.  Eyes: Conjunctivae normal are normal. Pupils are equal, round, and reactive to light. Right eye exhibits no discharge. Left eye exhibits no discharge. No scleral icterus.  Cardiovascular: Normal rate, regular rhythm, S1 normal, S2 normal and normal heart sounds.   No murmur heard. Pulmonary/Chest: Effort normal and breath sounds normal. No respiratory distress. He has no wheezes.  Abdominal: Soft. Bowel sounds are normal. He exhibits no distension. There is tenderness.       Min. ttp RUQ, epigastric   Neurological: He is alert and oriented to person, place, and time. Gait normal.  Skin: Skin is warm, dry and intact. He is not diaphoretic.       Severe varicosities b/l lower extremities R>L  Psychiatric: He has a normal mood and affect. His speech is normal and behavior is normal. Judgment and thought content normal. Cognition and memory are normal.          Assessment & Plan:  F/u 3 months

## 2012-04-10 ENCOUNTER — Other Ambulatory Visit: Payer: Self-pay | Admitting: *Deleted

## 2012-04-10 DIAGNOSIS — I10 Essential (primary) hypertension: Secondary | ICD-10-CM

## 2012-04-11 MED ORDER — LISINOPRIL-HYDROCHLOROTHIAZIDE 10-12.5 MG PO TABS: 1.0000 | ORAL_TABLET | Freq: Every day | ORAL | Status: DC

## 2012-04-11 NOTE — Telephone Encounter (Signed)
Rx called in to pharmacy. 

## 2012-05-01 ENCOUNTER — Ambulatory Visit (INDEPENDENT_AMBULATORY_CARE_PROVIDER_SITE_OTHER): Payer: No Typology Code available for payment source | Admitting: Internal Medicine

## 2012-05-01 ENCOUNTER — Encounter: Payer: Self-pay | Admitting: Internal Medicine

## 2012-05-01 VITALS — BP 113/77 | HR 88 | Temp 97.9°F | Ht 66.0 in | Wt 229.9 lb

## 2012-05-01 DIAGNOSIS — G4733 Obstructive sleep apnea (adult) (pediatric): Secondary | ICD-10-CM

## 2012-05-01 DIAGNOSIS — H547 Unspecified visual loss: Secondary | ICD-10-CM

## 2012-05-01 DIAGNOSIS — K219 Gastro-esophageal reflux disease without esophagitis: Secondary | ICD-10-CM

## 2012-05-01 DIAGNOSIS — I10 Essential (primary) hypertension: Secondary | ICD-10-CM

## 2012-05-01 DIAGNOSIS — E669 Obesity, unspecified: Secondary | ICD-10-CM

## 2012-05-01 DIAGNOSIS — I8390 Asymptomatic varicose veins of unspecified lower extremity: Secondary | ICD-10-CM

## 2012-05-01 MED ORDER — OMEPRAZOLE 20 MG PO CPDR: 40.0000 mg | DELAYED_RELEASE_CAPSULE | Freq: Every day | ORAL | Status: DC

## 2012-05-01 NOTE — Assessment & Plan Note (Addendum)
BP controlled with new BP medication  Patient is compliant  Will refer to eye MD as he is complaining of decreased vision and for routine check with history of HTN

## 2012-05-01 NOTE — Progress Notes (Signed)
  Subjective:    Patient ID: [name] [name], male    DOB: [birth year], 56 y.o.   MRN: [id number]  HPI Comments: 57 y.o PMH HTN (controlled BP 113/77), Varicose veins b/l lower extremites, GERD, OSA (without cpap).  He presents for follow up.  He has complaints on abdominal pain for the last 2 weeks which is now resolved.  Areas of abdominal pain were RUQ, LUQ, periumbilical.  Abdomen "hurt" was 6/10 now resolved.  No associating, aggravating or relieving factors.  He last had abdominal pain yesterday.  Pain has been intermittent x 2 weeks.  Denies fever, diarrhea, constipation, triggers such as food. He complains he also has nausea in the am which is not present currently.   He has not paid for his compression stockings as of yet.   He complains of decreased vision and has never seen an eye doctor  SH: He started working recently.     GI Problem Primary symptoms do not include fever, abdominal pain, nausea, vomiting or diarrhea. The illness began more than 7 days ago. The problem has been gradually improving.  The illness does not include constipation. Significant associated medical issues include GERD.      Review of Systems  Constitutional: Negative for fever.  Eyes:       Vision decreased   Respiratory: Positive for shortness of breath.        Chronic sob with exertion  Gastrointestinal: Negative for nausea, vomiting, abdominal pain, diarrhea and constipation.  Musculoskeletal:       Leg pain b/l legs with activity  Psychiatric/Behavioral: Negative for sleep disturbance.       Objective:   Physical Exam  Nursing note and vitals reviewed. Constitutional: He is oriented to person, place, and time. Vital signs are normal. He appears well-developed and well-nourished. He is cooperative. No distress.  HENT:  Head: Normocephalic and atraumatic.  Mouth/Throat: Oropharynx is clear and moist and mucous membranes are normal. Abnormal dentition. No oropharyngeal exudate.  Eyes:  Conjunctivae normal are normal. Pupils are equal, round, and reactive to light. Right eye exhibits no discharge. Left eye exhibits no discharge. No scleral icterus.  Cardiovascular: Normal rate, regular rhythm, S1 normal, S2 normal and normal heart sounds.   No murmur heard. Pulmonary/Chest: Effort normal and breath sounds normal. No respiratory distress. He has no wheezes.  Abdominal: Soft. Bowel sounds are normal. He exhibits no distension. There is no tenderness.       Obese ab.   Neurological: He is alert and oriented to person, place, and time. He has normal strength.  Skin: Skin is warm, dry and intact. No rash noted. He is not diaphoretic.       Significant varicose veins to b/l lower extremities No edema lower extremities b/l  Psychiatric: He has a normal mood and affect. His speech is normal and behavior is normal. Judgment and thought content normal. Cognition and memory are normal.          Assessment & Plan:  Follow up 6 months sooner if needed  Shirlee Latch MD

## 2012-05-01 NOTE — Assessment & Plan Note (Addendum)
Information given to patient on where to purchase compression hose for less  Advised to wear daily thigh high compression hose with 20-30 mmHg He may need to follow up with [facility] in the future

## 2012-05-01 NOTE — Assessment & Plan Note (Signed)
Patient has not been able to tolerate sleep study x 2 times  Needs cpap machine

## 2012-05-01 NOTE — Assessment & Plan Note (Signed)
Advised to exercise daily x 30 minutes to decrease abdominal girth

## 2012-05-01 NOTE — Assessment & Plan Note (Signed)
Will refill medication for GERD

## 2012-05-01 NOTE — Patient Instructions (Addendum)
Please follow up in 6 months, sooner if needed.   Your blood pressure looks good goal is <140/90!  Hydrochlorothiazide, HCTZ capsules or tablets What is this medicine? HYDROCHLOROTHIAZIDE (hye droe klor oh THYE a zide) is a diuretic. It increases the amount of urine passed, which causes the body to lose salt and water. This medicine is used to treat high blood pressure. It is also reduces the swelling and water retention caused by various medical conditions, such as heart, liver, or kidney disease. This medicine may be used for other purposes; ask your health care provider or pharmacist if you have questions. What should I tell my health care provider before I take this medicine? They need to know if you have any of these conditions: -diabetes -gout -immune system problems, like lupus -kidney disease or kidney stones -liver disease -pancreatitis -small amount of urine or difficulty passing urine -an unusual or allergic reaction to hydrochlorothiazide, sulfa drugs, other medicines, foods, dyes, or preservatives -pregnant or trying to get pregnant -breast-feeding How should I use this medicine? Take this medicine by mouth with a glass of water. Follow the directions on the prescription label. Take your medicine at regular intervals. Remember that you will need to pass urine frequently after taking this medicine. Do not take your doses at a time of day that will cause you problems. Do not stop taking your medicine unless your doctor tells you to. Talk to your pediatrician regarding the use of this medicine in children. Special care may be needed. Overdosage: If you think you have taken too much of this medicine contact a poison control center or emergency room at once. NOTE: This medicine is only for you. Do not share this medicine with others. What if I miss a dose? If you miss a dose, take it as soon as you can. If it is almost time for your next dose, take only that dose. Do not take double  or extra doses. What may interact with this medicine? -cholestyramine -colestipol -digoxin -dofetilide -lithium -medicines for blood pressure -medicines for diabetes -medicines that relax muscles for surgery -other diuretics -steroid medicines like prednisone or cortisone This list may not describe all possible interactions. Give your health care provider a list of all the medicines, herbs, non-prescription drugs, or dietary supplements you use. Also tell them if you smoke, drink alcohol, or use illegal drugs. Some items may interact with your medicine. What should I watch for while using this medicine? Visit your doctor or health care professional for regular checks on your progress. Check your blood pressure as directed. Ask your doctor or health care professional what your blood pressure should be and when you should contact him or her. You may need to be on a special diet while taking this medicine. Ask your doctor. Check with your doctor or health care professional if you get an attack of severe diarrhea, nausea and vomiting, or if you sweat a lot. The loss of too much body fluid can make it dangerous for you to take this medicine. You may get drowsy or dizzy. Do not drive, use machinery, or do anything that needs mental alertness until you know how this medicine affects you. Do not stand or sit up quickly, especially if you are an older patient. This reduces the risk of dizzy or fainting spells. Alcohol may interfere with the effect of this medicine. Avoid alcoholic drinks. This medicine may affect your blood sugar level. If you have diabetes, check with your doctor or health care  professional before changing the dose of your diabetic medicine. This medicine can make you more sensitive to the sun. Keep out of the sun. If you cannot avoid being in the sun, wear protective clothing and use sunscreen. Do not use sun lamps or tanning beds/booths. What side effects may I notice from receiving  this medicine? Side effects that you should report to your doctor or health care professional as soon as possible: -allergic reactions such as skin rash or itching, hives, swelling of the lips, mouth, tongue, or throat -changes in vision -chest pain -eye pain -fast or irregular heartbeat -feeling faint or lightheaded, falls -gout attack -muscle pain or cramps -pain or difficulty when passing urine -pain, tingling, numbness in the hands or feet -redness, blistering, peeling or loosening of the skin, including inside the mouth -unusually weak or tired Side effects that usually do not require medical attention (report to your doctor or health care professional if they continue or are bothersome): -change in sex drive or performance -dry mouth -headache -stomach upset This list may not describe all possible side effects. Call your doctor for medical advice about side effects. You may report side effects to FDA at 1-800-FDA-1088. Where should I keep my medicine? Keep out of the reach of children. Store at room temperature between 15 and 30 degrees C (59 and 86 degrees F). Do not freeze. Protect from light and moisture. Keep container closed tightly. Throw away any unused medicine after the expiration date. NOTE: This sheet is a summary. It may not cover all possible information. If you have questions about this medicine, talk to your doctor, pharmacist, or health care provider.  2012, Elsevier/Gold Standard. (11/04/2009 12:57:37 PM) Lisinopril tablets What is this medicine? LISINOPRIL (lyse IN oh pril) is an ACE inhibitor. This medicine is used to treat high blood pressure and heart failure. It is also used to protect the heart immediately after a heart attack. This medicine may be used for other purposes; ask your health care provider or pharmacist if you have questions. What should I tell my health care provider before I take this medicine? They need to know if you have any of these  conditions: -diabetes -heart or blood vessel disease -immune system disease like lupus or scleroderma -kidney disease -low blood pressure -previous swelling of the tongue, face, or lips with difficulty breathing, difficulty swallowing, hoarseness, or tightening of the throat -an unusual or allergic reaction to lisinopril, other ACE inhibitors, insect venom, foods, dyes, or preservatives -pregnant or trying to get pregnant -breast-feeding How should I use this medicine? Take this medicine by mouth with a glass of water. Follow the directions on your prescription label. You may take this medicine with or without food. Take your medicine at regular intervals. Do not stop taking this medicine except on the advice of your doctor or health care professional. Talk to your pediatrician regarding the use of this medicine in children. Special care may be needed. While this drug may be prescribed for children as young as 66 years of age for selected conditions, precautions do apply. Overdosage: If you think you have taken too much of this medicine contact a poison control center or emergency room at once. NOTE: This medicine is only for you. Do not share this medicine with others. What if I miss a dose? If you miss a dose, take it as soon as you can. If it is almost time for your next dose, take only that dose. Do not take double or extra doses.  What may interact with this medicine? -diuretics -lithium -NSAIDs, medicines for pain and inflammation, like ibuprofen or naproxen -over-the-counter herbal supplements like hawthorn -potassium salts or potassium supplements -salt substitutes This list may not describe all possible interactions. Give your health care provider a list of all the medicines, herbs, non-prescription drugs, or dietary supplements you use. Also tell them if you smoke, drink alcohol, or use illegal drugs. Some items may interact with your medicine. What should I watch for while using  this medicine? Visit your doctor or health care professional for regular check ups. Check your blood pressure as directed. Ask your doctor what your blood pressure should be, and when you should contact him or her. Call your doctor or health care professional if you notice an irregular or fast heart beat. Women should inform their doctor if they wish to become pregnant or think they might be pregnant. There is a potential for serious side effects to an unborn child. Talk to your health care professional or pharmacist for more information. Check with your doctor or health care professional if you get an attack of severe diarrhea, nausea and vomiting, or if you sweat a lot. The loss of too much body fluid can make it dangerous for you to take this medicine. You may get drowsy or dizzy. Do not drive, use machinery, or do anything that needs mental alertness until you know how this drug affects you. Do not stand or sit up quickly, especially if you are an older patient. This reduces the risk of dizzy or fainting spells. Alcohol can make you more drowsy and dizzy. Avoid alcoholic drinks. Avoid salt substitutes unless you are told otherwise by your doctor or health care professional. Do not treat yourself for coughs, colds, or pain while you are taking this medicine without asking your doctor or health care professional for advice. Some ingredients may increase your blood pressure. What side effects may I notice from receiving this medicine? Side effects that you should report to your doctor or health care professional as soon as possible: -abdominal pain with or without nausea or vomiting -allergic reactions like skin rash or hives, swelling of the hands, feet, face, lips, throat, or tongue -dark urine -difficulty breathing -dizzy, lightheaded or fainting spell -fever or sore throat -irregular heart beat, chest pain -pain or difficulty passing urine -redness, blistering, peeling or loosening of the skin,  including inside the mouth -unusually weak -yellowing of the eyes or skin Side effects that usually do not require medical attention (report to your doctor or health care professional if they continue or are bothersome): -change in taste -cough -decreased sexual function or desire -headache -sun sensitivity -tiredness This list may not describe all possible side effects. Call your doctor for medical advice about side effects. You may report side effects to FDA at 1-800-FDA-1088. Where should I keep my medicine? Keep out of the reach of children. Store at room temperature between 15 and 30 degrees C (59 and 86 degrees F). Protect from moisture. Keep container tightly closed. Throw away any unused medicine after the expiration date. NOTE: This sheet is a summary. It may not cover all possible information. If you have questions about this medicine, talk to your doctor, pharmacist, or health care provider.  2012, Elsevier/Gold Standard. (09/15/2007 5:36:32 PM) Hypertension Hypertension is another name for high blood pressure. High blood pressure may mean that your heart needs to work harder to pump blood. Blood pressure consists of two numbers, which includes a higher number over  a lower number (example: 110/72). HOME CARE   Make lifestyle changes as told by your doctor. This may include weight loss and exercise.  Take your blood pressure medicine every day.  Limit how much salt you use.  Stop smoking if you smoke.  Do not use drugs.  Talk to your doctor if you are using decongestants or birth control pills. These medicines might make blood pressure higher.  Females should not drink more than 1 alcoholic drink per day. Males should not drink more than 2 alcoholic drinks per day.  See your doctor as told. GET HELP RIGHT AWAY IF:   You have a blood pressure reading with a top number of 180 or higher.  You get a very bad headache.  You get blurred or changing vision.  You feel  confused.  You feel weak, numb, or faint.  You get chest or belly (abdominal) pain.  You throw up (vomit).  You cannot breathe very well. MAKE SURE YOU:   Understand these instructions.  Will watch your condition.  Will get help right away if you are not doing well or get worse. Document Released: 08/29/2007 Document Revised: 06/04/2011 Document Reviewed: 08/29/2007 [facility] Patient Information 2013 [place], [place].  Nausea, Adult Nausea means you feel sick to your stomach or need to throw up (vomit). It may be a sign of a more serious problem. If nausea gets worse, you may throw up. If you throw up a lot, you may lose too much body fluid (dehydration). HOME CARE   Get plenty of rest.  Ask your doctor how to replace body fluid losses (rehydrate).  Eat small amounts of food. Sip liquids more often.  Take all medicines as told by your doctor. GET HELP RIGHT AWAY IF:  You have a fever.  You pass out (faint).  You keep throwing up or have blood in your throw up.  You are very weak, have dry lips or a dry mouth, or you are very thirsty (dehydrated).  You have dark or bloody poop (stool).  You have very bad chest or belly (abdominal) pain.  You do not get better after 2 days, or you get worse.  You have a headache. MAKE SURE YOU:  Understand these instructions.  Will watch your condition.  Will get help right away if you are not doing well or get worse. Document Released: 03/01/2011 Document Revised: 06/04/2011 Document Reviewed: 03/01/2011 [facility] Patient Information 2013 [place], [place].  Abdominal Pain Many things can cause belly (abdominal) pain. Most times, the belly pain is not dangerous. The amount of belly pain does not tell how serious the problem may be. Many cases of belly pain can be watched and treated at home. HOME CARE   Do not take medicines that help you go poop (laxatives) unless told to by your doctor.  Only take medicine as told by your  doctor.  Eat or drink as told by your doctor. Your doctor will tell you if you should be on a special diet. GET HELP RIGHT AWAY IF:   The pain does not go away.  You have a fever.  You keep throwing up (vomiting).  The pain changes and is only in the right or left part of the belly.  You have bloody or tarry looking poop. MAKE SURE YOU:   Understand these instructions.  Will watch your condition.  Will get help right away if you are not doing well or get worse. Document Released: 08/29/2007 Document Revised: 06/04/2011 Document Reviewed: 03/28/2009  [employer] Patient Information 2013 [employer].  

## 2012-06-09 ENCOUNTER — Other Ambulatory Visit: Payer: Self-pay | Admitting: *Deleted

## 2012-06-09 MED ORDER — CETIRIZINE HCL 10 MG PO TABS: 10.0000 mg | ORAL_TABLET | Freq: Every day | ORAL | Status: DC

## 2012-06-20 NOTE — Telephone Encounter (Signed)
Rx called into [facility] 

## 2012-07-03 NOTE — Addendum Note (Signed)
Addended by: Neomia Dear on: 07/03/2012 08:31 PM   Modules accepted: Orders

## 2012-10-09 ENCOUNTER — Encounter: Payer: Self-pay | Admitting: Internal Medicine

## 2012-10-09 ENCOUNTER — Ambulatory Visit (INDEPENDENT_AMBULATORY_CARE_PROVIDER_SITE_OTHER): Payer: Medicaid Other | Admitting: Internal Medicine

## 2012-10-09 VITALS — BP 130/79 | HR 96 | Temp 97.9°F | Ht 67.0 in | Wt 233.0 lb

## 2012-10-09 DIAGNOSIS — I83893 Varicose veins of bilateral lower extremities with other complications: Secondary | ICD-10-CM

## 2012-10-09 DIAGNOSIS — Z0000 Encounter for general adult medical examination without abnormal findings: Secondary | ICD-10-CM

## 2012-10-09 DIAGNOSIS — R109 Unspecified abdominal pain: Secondary | ICD-10-CM

## 2012-10-09 DIAGNOSIS — R1013 Epigastric pain: Secondary | ICD-10-CM

## 2012-10-09 DIAGNOSIS — R0681 Apnea, not elsewhere classified: Secondary | ICD-10-CM

## 2012-10-09 DIAGNOSIS — K219 Gastro-esophageal reflux disease without esophagitis: Secondary | ICD-10-CM

## 2012-10-09 DIAGNOSIS — E669 Obesity, unspecified: Secondary | ICD-10-CM

## 2012-10-09 DIAGNOSIS — I10 Essential (primary) hypertension: Secondary | ICD-10-CM

## 2012-10-09 LAB — COMPLETE METABOLIC PANEL WITH GFR
ALT: 21 U/L (ref 0–53)
AST: 19 U/L (ref 0–37)
Albumin: 4 g/dL (ref 3.5–5.2)
Alkaline Phosphatase: 68 U/L (ref 39–117)
BUN: 16 mg/dL (ref 6–23)
Potassium: 3.8 mEq/L (ref 3.5–5.3)
Sodium: 138 mEq/L (ref 135–145)
Total Protein: 6.8 g/dL (ref 6.0–8.3)

## 2012-10-09 LAB — CBC WITH DIFFERENTIAL/PLATELET
Basophils Absolute: 0 10*3/uL (ref 0.0–0.1)
Eosinophils Relative: 3 % (ref 0–5)
HCT: 37.2 % — ABNORMAL LOW (ref 39.0–52.0)
Lymphocytes Relative: 31 % (ref 12–46)
MCH: 29.4 pg (ref 26.0–34.0)
MCV: 90.3 fL (ref 78.0–100.0)
Monocytes Absolute: 0.5 10*3/uL (ref 0.1–1.0)
RDW: 13.1 % (ref 11.5–15.5)
WBC: 6 10*3/uL (ref 4.0–10.5)

## 2012-10-09 LAB — URINALYSIS, ROUTINE W REFLEX MICROSCOPIC
Bilirubin Urine: NEGATIVE
Glucose, UA: NEGATIVE mg/dL
Specific Gravity, Urine: 1.017 (ref 1.005–1.030)

## 2012-10-09 LAB — POCT GLYCOSYLATED HEMOGLOBIN (HGB A1C): Hemoglobin A1C: 5.4

## 2012-10-09 LAB — GLUCOSE, CAPILLARY: Glucose-Capillary: 87 mg/dL (ref 70–99)

## 2012-10-09 MED ORDER — OMEPRAZOLE 20 MG PO CPDR: 40.0000 mg | DELAYED_RELEASE_CAPSULE | Freq: Every day | ORAL | Status: DC

## 2012-10-09 MED ORDER — OMEPRAZOLE 20 MG PO CPDR: 20.0000 mg | DELAYED_RELEASE_CAPSULE | Freq: Every day | ORAL | Status: DC

## 2012-10-09 NOTE — Patient Instructions (Addendum)
General Instructions: Please follow up in 1-2 weeks if needed for abdominal pain Otherwise follow up in 6 months    Treatment Goals:  Goals (1 Years of Data) as of 10/09/12   None      Progress Toward Treatment Goals:  Treatment Goal 10/09/2012  Blood pressure at goal    Self Care Goals & Plans:  Self Care Goal 10/09/2012  Manage my medications take my medicines as prescribed; refill my medications on time; bring my medications to every visit  Monitor my health keep track of my weight  Eat healthy foods eat foods that are low in salt; eat baked foods instead of fried foods; drink diet soda or water instead of juice or soda; eat more vegetables; eat fruit for snacks and desserts; eat smaller portions  Be physically active find an activity I enjoy; take a walk every day  Meeting treatment goals maintain the current self-care plan       Care Management & Community Referrals:  Referral 10/09/2012  Referrals made for care management support none needed  Referrals made to community resources none       Abdominal Pain (Nonspecific) Your exam might not show the exact reason you have abdominal pain. Since there are many different causes of abdominal pain, another checkup and more tests may be needed. It is very important to follow up for lasting (persistent) or worsening symptoms. A possible cause of abdominal pain in any person who still has his or her appendix is acute appendicitis. Appendicitis is often hard to diagnose. Normal blood tests, urine tests, ultrasound, and CT scans do not completely rule out early appendicitis or other causes of abdominal pain. Sometimes, only the changes that happen over time will allow appendicitis and other causes of abdominal pain to be determined. Other potential problems that may require surgery may also take time to become more apparent. Because of this, it is important that you follow all of the instructions below. HOME CARE INSTRUCTIONS   Rest  as much as possible.  Do not eat solid food until your pain is gone.  While adults or children have pain: A diet of water, weak decaffeinated tea, broth or bouillon, gelatin, oral rehydration solutions (ORS), frozen ice pops, or ice chips may be helpful.  When pain is gone in adults or children: Start a light diet (dry toast, crackers, applesauce, or white rice). Increase the diet slowly as long as it does not bother you. Eat no dairy products (including cheese and eggs) and no spicy, fatty, fried, or high-fiber foods.  Use no alcohol, caffeine, or cigarettes.  Take your regular medicines unless your caregiver told you not to.  Take any prescribed medicine as directed.  Only take over-the-counter or prescription medicines for pain, discomfort, or fever as directed by your caregiver. Do not give aspirin to children. If your caregiver has given you a follow-up appointment, it is very important to keep that appointment. Not keeping the appointment could result in a permanent injury and/or lasting (chronic) pain and/or disability. If there is any problem keeping the appointment, you must call to reschedule.  SEEK IMMEDIATE MEDICAL CARE IF:   Your pain is not gone in 24 hours.  Your pain becomes worse, changes location, or feels different.  You or your child has an oral temperature above 102 F (38.9 C), not controlled by medicine.  Your baby is older than 3 months with a rectal temperature of 102 F (38.9 C) or higher.  Your baby is 3  months old or younger with a rectal temperature of 100.4 F (38 C) or higher.  You have shaking chills.  You keep throwing up (vomiting) or cannot drink liquids.  There is blood in your vomit or you see blood in your bowel movements.  Your bowel movements become dark or black.  You have frequent bowel movements.  Your bowel movements stop (become blocked) or you cannot pass gas.  You have bloody, frequent, or painful urination.  You have yellow  discoloration in the skin or whites of the eyes.  Your stomach becomes bloated or bigger.  You have dizziness or fainting.  You have chest or back pain. MAKE SURE YOU:   Understand these instructions.  Will watch your condition.  Will get help right away if you are not doing well or get worse. Document Released: 03/12/2005 Document Revised: 06/04/2011 Document Reviewed: 02/07/2009 [facility] Patient Information 2014 [place], [place].

## 2012-10-10 ENCOUNTER — Encounter: Payer: Self-pay | Admitting: Internal Medicine

## 2012-10-10 DIAGNOSIS — R1013 Epigastric pain: Secondary | ICD-10-CM | POA: Insufficient documentation

## 2012-10-10 MED ORDER — OMEPRAZOLE 40 MG PO CPDR: 40.0000 mg | DELAYED_RELEASE_CAPSULE | Freq: Every day | ORAL | Status: DC

## 2012-10-10 NOTE — Assessment & Plan Note (Signed)
Patient reports sx's of DM when asked ROS checked HA1C

## 2012-10-10 NOTE — Assessment & Plan Note (Signed)
Rx refill Prilosec 40 mg qd #30 RF x 11

## 2012-10-10 NOTE — Assessment & Plan Note (Signed)
BP Readings from Last 3 Encounters:  10/09/12 130/79  05/01/12 113/77  03/13/12 132/90    Lab Results  Component Value Date   NA 138 10/09/2012   K 3.8 10/09/2012   CREATININE 1.06 10/09/2012    Assessment: Blood pressure control: controlled Progress toward BP goal:  at goal Comments: none  Plan: Medications:  continue current medications Educational resources provided: brochure;video Self management tools provided: home blood pressure logbook Other plans: reassess at follow up

## 2012-10-10 NOTE — Assessment & Plan Note (Addendum)
Unable to afford 20-33 mmHg compression stockings  Will Rx TED hose to wear but ideally he needs compression stockings or the varicosities will become even worse  They are symptomatic causing pain intermittently

## 2012-10-10 NOTE — Assessment & Plan Note (Signed)
Epigastric pain Ddx hiatal hernia, GERD, pancreatitis (less likely as patient has never had a flare as doesn't drink alcohol in excess unless he has gallstones), dyspepsia  CMET, CBC, lipase, UA Will order US abdomen to r/o gallstones or other etiology of epigastric pain Rx refill Prilosec today

## 2012-10-10 NOTE — Progress Notes (Signed)
  Subjective:    Patient ID: [name] [name], male    DOB: 1955/07/16, 57 y.o.   MRN: [id number]  HPI Comments: 57 y.o PMH OSA (no on cpap), severe varicose veins lower extremities (not using compression stockings), HTN (BP 130/79), HLD, GERD, constipation, benign colon polyps, hiatal hernia, diverticulosis.  He presents for follow up.  He states he has generalized abdominal pain. The last time his belly hurt was yesterday. He is passing gas and having bowel movements.  He has not taken any medications for abdominal pain which has been intermittent for the past 1-1.5 weeks.  He has also been nauseated (denies currently), vomiting in the mornings x 1 week.  He states if he eats late at night he throws up.  See other ROS.  He also responds yes when asked about nocturia (2 x at night), increased thirst, increased hunger and polyuria.  He states he has had erectile dysfunction for 1-2 months and decreased sex drive and requests viagra but then states his erectile dysfunction is getting better and he does not want the meidcation.    ROS per HPI  SH: He reports he is about to get disability.  He drinks 1 beer occasionally.       Review of Systems  Constitutional: Negative for fever, chills and appetite change.  Gastrointestinal: Negative for blood in stool.  Genitourinary: Negative for dysuria and hematuria.       Objective:   Physical Exam  Nursing note and vitals reviewed. Constitutional: He is oriented to person, place, and time. Vital signs are normal. He appears well-developed and well-nourished. He is cooperative.  HENT:  Head: Normocephalic and atraumatic.  Eyes: Conjunctivae are normal. Right eye exhibits no discharge. Left eye exhibits no discharge. No scleral icterus.  Cardiovascular: Normal rate, regular rhythm, S1 normal, S2 normal and normal heart sounds.   No murmur heard. Pulmonary/Chest: Effort normal and breath sounds normal.  Abdominal: Soft. Bowel sounds are normal. He  exhibits no distension. There is tenderness in the epigastric area.    Obese abdomen  Musculoskeletal: He exhibits no edema.  Neurological: He is alert and oriented to person, place, and time. Gait normal.  Skin: Skin is warm and dry. No rash noted.  Bulging Varicosities right lower extremity >left   Psychiatric: He has a normal mood and affect. His speech is normal and behavior is normal. Judgment and thought content normal. Cognition and memory are normal.          Assessment & Plan:

## 2012-10-10 NOTE — Assessment & Plan Note (Signed)
Obese discussed incorporating exercise into lifestyle, decreased fatty/sweet foods

## 2012-10-13 NOTE — Progress Notes (Signed)
Case discussed with Dr. McLean soon after the resident saw the patient.  We reviewed the resident's history and exam and pertinent patient test results.  I agree with the assessment, diagnosis, and plan of care documented in the resident's note. 

## 2012-11-17 ENCOUNTER — Ambulatory Visit (HOSPITAL_COMMUNITY): Payer: Medicaid Other

## 2012-12-16 ENCOUNTER — Ambulatory Visit (INDEPENDENT_AMBULATORY_CARE_PROVIDER_SITE_OTHER): Payer: Medicaid Other | Admitting: *Deleted

## 2012-12-16 DIAGNOSIS — Z23 Encounter for immunization: Secondary | ICD-10-CM

## 2013-01-15 ENCOUNTER — Encounter: Payer: Medicaid Other | Admitting: Internal Medicine

## 2013-04-28 ENCOUNTER — Other Ambulatory Visit: Payer: Self-pay | Admitting: *Deleted

## 2013-04-28 DIAGNOSIS — I10 Essential (primary) hypertension: Secondary | ICD-10-CM

## 2013-04-28 MED ORDER — LISINOPRIL-HYDROCHLOROTHIAZIDE 10-12.5 MG PO TABS: 1.0000 | ORAL_TABLET | Freq: Every day | ORAL | Status: DC

## 2013-04-28 NOTE — Telephone Encounter (Signed)
rx faxed in 

## 2013-05-14 ENCOUNTER — Ambulatory Visit (INDEPENDENT_AMBULATORY_CARE_PROVIDER_SITE_OTHER): Payer: Medicaid Other | Admitting: Internal Medicine

## 2013-05-14 ENCOUNTER — Encounter: Payer: Self-pay | Admitting: Internal Medicine

## 2013-05-14 VITALS — BP 127/90 | HR 90 | Temp 97.9°F | Ht 66.0 in | Wt 234.6 lb

## 2013-05-14 DIAGNOSIS — K219 Gastro-esophageal reflux disease without esophagitis: Secondary | ICD-10-CM

## 2013-05-14 DIAGNOSIS — G4733 Obstructive sleep apnea (adult) (pediatric): Secondary | ICD-10-CM

## 2013-05-14 DIAGNOSIS — Z973 Presence of spectacles and contact lenses: Secondary | ICD-10-CM

## 2013-05-14 DIAGNOSIS — I10 Essential (primary) hypertension: Secondary | ICD-10-CM

## 2013-05-14 DIAGNOSIS — Z789 Other specified health status: Secondary | ICD-10-CM

## 2013-05-14 DIAGNOSIS — I8390 Asymptomatic varicose veins of unspecified lower extremity: Secondary | ICD-10-CM

## 2013-05-14 DIAGNOSIS — R0681 Apnea, not elsewhere classified: Secondary | ICD-10-CM

## 2013-05-14 DIAGNOSIS — E669 Obesity, unspecified: Secondary | ICD-10-CM

## 2013-05-14 MED ORDER — OMEPRAZOLE 40 MG PO CPDR: 40.0000 mg | DELAYED_RELEASE_CAPSULE | Freq: Every day | ORAL | Status: DC

## 2013-05-14 MED ORDER — CETIRIZINE HCL 10 MG PO TABS: 10.0000 mg | ORAL_TABLET | Freq: Every day | ORAL | Status: DC

## 2013-05-14 MED ORDER — ALBUTEROL SULFATE HFA 108 (90 BASE) MCG/ACT IN AERS: 2.0000 | INHALATION_SPRAY | Freq: Four times a day (QID) | RESPIRATORY_TRACT | Status: DC | PRN

## 2013-05-14 NOTE — Assessment & Plan Note (Signed)
Encouraged to wear TED hose

## 2013-05-14 NOTE — Assessment & Plan Note (Signed)
BP Readings from Last 3 Encounters:  05/14/13 127/90  10/09/12 130/79  05/01/12 113/77    Lab Results  Component Value Date   NA 138 10/09/2012   K 3.8 10/09/2012   CREATININE 1.06 10/09/2012    Assessment: Blood pressure control: controlled Progress toward BP goal:  at goal Comments: none  Plan: Medications:  continue current medications Educational resources provided: brochure;handout;video Self management tools provided: other (see comments) Other plans: none

## 2013-05-14 NOTE — Assessment & Plan Note (Signed)
Rx refill of Prilosec today He has small hiatal hernia which can exacerbate GERD Disc'ed dos and donts with hiatal hernia today

## 2013-05-14 NOTE — Patient Instructions (Addendum)
General Instructions: You can try Gas X for gas/indigestion Take all medications as instructed  Follow up in 6 months, sooner if needed.  Read all the information given   Treatment Goals:  Goals (1 Years of Data) as of 05/14/13         As of Today 10/09/12 05/01/12     Blood Pressure    . Blood Pressure < 140/90  127/90 130/79 113/77      Progress Toward Treatment Goals:  Treatment Goal 05/14/2013  Blood pressure at goal    Self Care Goals & Plans:  Self Care Goal 05/14/2013  Manage my medications take my medicines as prescribed; bring my medications to every visit; refill my medications on time; follow the sick day instructions if I am sick  Monitor my health keep track of my blood pressure  Eat healthy foods eat more vegetables; eat fruit for snacks and desserts; eat baked foods instead of fried foods  Be physically active find an activity I enjoy  Meeting treatment goals maintain the current self-care plan    No flowsheet data found.   Care Management & Community Referrals:  Referral 05/14/2013  Referrals made for care management support social worker  Referrals made to community resources none       Hiatal Hernia A hiatal hernia occurs when a part of the stomach slides above the diaphragm. The diaphragm is the thin muscle separating the belly (abdomen) from the chest. A hiatal hernia can be something you are born with or develop over time. Hiatal hernias may allow stomach acid to flow back into your esophagus, the tube which carries food from your mouth to your stomach. If this acid causes problems it is called GERD (gastro-esophageal reflux disease).  SYMPTOMS  Common symptoms of GERD are heartburn (burning in your chest). This is worse when lying down or bending over. It may also cause belching and indigestion. Some of the things which make GERD worse are:  Increased weight pushes on stomach making acid rise more easily.  Smoking markedly increases acid  production.  Alcohol decreases lower esophageal sphincter pressure (valve between stomach and esophagus), allowing acid from stomach into esophagus.  Late evening meals and going to bed with a full stomach increases pressure.  Anything that causes an increase in acid production.  Lower esophageal sphincter incompetence. DIAGNOSIS  Hiatal hernia is often diagnosed with x-rays of your stomach and small bowel. This is called an UGI (upper gastrointestinal x-ray). Sometimes a gastroscopic procedure is done. This is a procedure where your caregiver uses a flexible instrument to look into the stomach and small bowel. HOME CARE INSTRUCTIONS   Try to achieve and maintain an ideal body weight.  Avoid drinking alcoholic beverages.  Stop smoking.  Put the head of your bed on 4 to 6 inch blocks. This will keep your head and esophagus higher than your stomach. If you cannot use blocks, sleep with several pillows under your head and shoulders.  Over-the-counter medications will decrease acid production. Your caregiver can also prescribe medications for this. Take as directed.  1/2 to 1 teaspoon of an antacid taken every hour while awake, with meals and at bedtime, will neutralize acid.  Do not take aspirin, ibuprofen (Advil or Motrin), or other nonsteroidal anti-inflammatory drugs.  Do not wear tight clothing around your chest or stomach.  Eat smaller meals and eat more frequently. This keeps your stomach from getting too full. Eat slowly.  Do not lie down for 2 or 3 hours  after eating. Do not eat or drink anything 1 to 2 hours before going to bed.  Avoid caffeine beverages (colas, coffee, cocoa, tea), fatty foods, citrus fruits and all other foods and drinks that contain acid and that seem to increase the problems.  Avoid bending over, especially after eating. Also avoid straining during bowel movements or when urinating or lifting things. Anything that increases the pressure in your belly  increases the amount of acid that may be pushed up into your esophagus. SEEK IMMEDIATE MEDICAL CARE IF:  There is change in location (pain in arms, neck, jaw, teeth or back) of your pain, or the pain is getting worse.  You also experience nausea, vomiting, sweating (diaphoresis), or shortness of breath.  You develop continual vomiting, vomit blood or coffee ground material, have bright red blood in your stools, or have black tarry stools. Some of these symptoms could signal other problems such as heart disease. MAKE SURE YOU:   Understand these instructions.  Monitor your condition.  Contact your caregiver if you are not doing well or are getting worse. Document Released: 06/02/2003 Document Revised: 06/04/2011 Document Reviewed: 03/12/2005 [facility] Patient Information 2014 [place], [place]. Indigestion Indigestion is discomfort in the upper abdomen that is caused by underlying problems such as gastroesophageal reflux disease (GERD), ulcers, or gallbladder problems.  CAUSES  Indigestion can be caused by many things. Possible causes include:  Stomach acid in the esophagus.  Stomach infections, usually caused by the bacteria H. pylori.  Being overweight.  Hiatal hernia. This means part of the stomach pushes up through the diaphragm.  Overeating.  Emotional problems, such as stress, anxiety, or depression.  Poor nutrition.  Consuming too much alcohol, tobacco, or caffeine.  Consuming spicy foods, fats, peppermint, chocolate, tomato products, citrus, or fruit juices.  Medicines such as aspirin and other anti-inflammatory drugs, hormones, steroids, and thyroid medicines.  Gastroparesis. This is a condition in which the stomach does not empty properly.  Stomach cancer.  Pregnancy, due to an increase in hormone levels, a relaxation of muscles in the digestive tract, and pressure on the stomach from the growing fetus. SYMPTOMS   Uncomfortable feeling of fullness after  eating.  Pain or burning sensation in the upper abdomen.  Bloating.  Belching and gas.  Nausea and vomiting.  Acidic taste in the mouth.  Burning sensation in the chest (heartburn). DIAGNOSIS  Your caregiver will review your medical history and perform a physical exam. Other tests, such as blood tests, stool tests, X-rays, and other imaging scans, may be done to check for more serious problems. TREATMENT  Liquid antacids and other drugs may be given to block stomach acid secretion. Medicines that increase esophageal muscle tone may also be given to help reduce symptoms. If an infection is found, antibiotic medicine may be given. HOME CARE INSTRUCTIONS  Avoid foods and drinks that make your symptoms worse, such as:  Caffeine or alcoholic drinks.  Chocolate.  Peppermint or mint flavorings.  Garlic and onions.  Spicy foods.  Citrus fruits, such as oranges, lemons, or limes.  Tomato-based foods such as sauce, chili, salsa, and pizza.  Fried and fatty foods.  Avoid eating for the 3 hours prior to your bedtime.  Eat small, frequent meals instead of large meals.  Stop smoking if you smoke.  Maintain a healthy weight.  Wear loose-fitting clothing. Do not wear anything tight around your waist that causes pressure on your stomach.  Raise the head of your bed 4 to 8 inches with wood  blocks to help you sleep. Extra pillows will not help.  Only take over-the-counter or prescription medicines as directed by your caregiver.  Do not take aspirin, ibuprofen, or other nonsteroidal anti-inflammatory drugs (NSAIDs). SEEK IMMEDIATE MEDICAL CARE IF:   You are not better after 2 days.  You have chest pressure or pain that radiates up into your neck, arms, back, jaw, or upper abdomen.  You have difficulty swallowing.  You keep vomiting.  You have black or bloody stools.  You have a fever.  You have dizziness, fainting, difficulty breathing, or heavy sweating.  You have  severe abdominal pain.  You lose weight without trying. MAKE SURE YOU:  Understand these instructions.  Will watch your condition.  Will get help right away if you are not doing well or get worse. Document Released: 04/19/2004 Document Revised: 06/04/2011 Document Reviewed: 10/25/2010 [facility] Patient Information 2014 [place], [place].   Hypertension As your heart beats, it forces blood through your arteries. This force is your blood pressure. If the pressure is too high, it is called hypertension (HTN) or high blood pressure. HTN is dangerous because you may have it and not know it. High blood pressure may mean that your heart has to work harder to pump blood. Your arteries may be narrow or stiff. The extra work puts you at risk for heart disease, stroke, and other problems.  Blood pressure consists of two numbers, a higher number over a lower, 110/72, for example. It is stated as "110 over 72." The ideal is below 120 for the top number (systolic) and under 80 for the bottom (diastolic). Write down your blood pressure today. You should pay close attention to your blood pressure if you have certain conditions such as:  Heart failure.  Prior heart attack.  Diabetes  Chronic kidney disease.  Prior stroke.  Multiple risk factors for heart disease. To see if you have HTN, your blood pressure should be measured while you are seated with your arm held at the level of the heart. It should be measured at least twice. A one-time elevated blood pressure reading (especially in the Emergency Department) does not mean that you need treatment. There may be conditions in which the blood pressure is different between your right and left arms. It is important to see your caregiver soon for a recheck. Most people have essential hypertension which means that there is not a specific cause. This type of high blood pressure may be lowered by changing lifestyle factors such as:  Stress.  Smoking.  Lack  of exercise.  Excessive weight.  Drug/tobacco/alcohol use.  Eating less salt. Most people do not have symptoms from high blood pressure until it has caused damage to the body. Effective treatment can often prevent, delay or reduce that damage. TREATMENT  When a cause has been identified, treatment for high blood pressure is directed at the cause. There are a large number of medications to treat HTN. These fall into several categories, and your caregiver will help you select the medicines that are best for you. Medications may have side effects. You should review side effects with your caregiver. If your blood pressure stays high after you have made lifestyle changes or started on medicines,   Your medication(s) may need to be changed.  Other problems may need to be addressed.  Be certain you understand your prescriptions, and know how and when to take your medicine.  Be sure to follow up with your caregiver within the time frame advised (usually within  two weeks) to have your blood pressure rechecked and to review your medications.  If you are taking more than one medicine to lower your blood pressure, make sure you know how and at what times they should be taken. Taking two medicines at the same time can result in blood pressure that is too low. SEEK IMMEDIATE MEDICAL CARE IF:  You develop a severe headache, blurred or changing vision, or confusion.  You have unusual weakness or numbness, or a faint feeling.  You have severe chest or abdominal pain, vomiting, or breathing problems. MAKE SURE YOU:   Understand these instructions.  Will watch your condition.  Will get help right away if you are not doing well or get worse. Document Released: 03/12/2005 Document Revised: 06/04/2011 Document Reviewed: 10/31/2007 [facility] Patient Information 2014 [employer].  Exercise to Lose Weight Exercise and a healthy diet may help you lose weight. Your doctor may suggest specific  exercises. EXERCISE IDEAS AND TIPS  Choose low-cost things you enjoy doing, such as walking, bicycling, or exercising to workout videos.  Take stairs instead of the elevator.  Walk during your lunch break.  Park your car further away from work or school.  Go to a gym or an exercise class.  Start with 5 to 10 minutes of exercise each day. Build up to 30 minutes of exercise 4 to 6 days a week.  Wear shoes with good support and comfortable clothes.  Stretch before and after working out.  Work out until you breathe harder and your heart beats faster.  Drink extra water when you exercise.  Do not do so much that you hurt yourself, feel dizzy, or get very short of breath. Exercises that burn about 150 calories:  Running 1  miles in 15 minutes.  Playing volleyball for 45 to 60 minutes.  Washing and waxing a car for 45 to 60 minutes.  Playing touch football for 45 minutes.  Walking 1  miles in 35 minutes.  Pushing a stroller 1  miles in 30 minutes.  Playing basketball for 30 minutes.  Raking leaves for 30 minutes.  Bicycling 5 miles in 30 minutes.  Walking 2 miles in 30 minutes.  Dancing for 30 minutes.  Shoveling snow for 15 minutes.  Swimming laps for 20 minutes.  Walking up stairs for 15 minutes.  Bicycling 4 miles in 15 minutes.  Gardening for 30 to 45 minutes.  Jumping rope for 15 minutes.  Washing windows or floors for 45 to 60 minutes. Document Released: 04/14/2010 Document Revised: 06/04/2011 Document Reviewed: 04/14/2010 [facility] Patient Information 2014 [place], [place].

## 2013-05-14 NOTE — Assessment & Plan Note (Signed)
Discussed weight loss. Pt interested in going to [facility].  Will contact social worker to see if she can help get into [facility] program

## 2013-05-14 NOTE — Assessment & Plan Note (Addendum)
Will try to work on getting cpap machine now that pt has disability  Environmental education officer

## 2013-05-14 NOTE — Progress Notes (Signed)
   Subjective:    Patient ID: [name] [name], male    DOB: 12-Sep-1955, 58 y.o.   MRN: [id number]  HPI Comments: 58 y.o PMH OSA (not on cpap), severe varicose veins lower extremities (not using compression stockings), HTN (BP 127/90), HLD, GERD, small hiatal hernia, constipation, benign colon polyps, diverticulosis.  '   He presents for follow up. He wants to discuss the hernia.  He reports clear to white productive cough >1 year mostly in the am.  Needs meds for seasonal allergies.   1)Heartburn associated with hiatal hernia is relieved by Prilosec which he needs a Rx refill 2) OSA not on cpap.  Now that pt has disability wants cpap machine  3) He needs Rx refill of Zyrtec and Albuterol.   4) Varicose veins. He is not wearing compression stockings at this time.   5) He is interested in working out at [employer] and wants financial assistance program.      SH: Has disability.  Denies smoking but his girlfriend smokes. He has 5 kids.       Review of Systems  Respiratory: Negative for shortness of breath.   Cardiovascular: Negative for chest pain.  Gastrointestinal: Negative for abdominal pain.       +indigestion        Objective:   Physical Exam  Nursing note and vitals reviewed. Constitutional: He is oriented to person, place, and time. Vital signs are normal. He appears well-developed and well-nourished. He is cooperative.  HENT:  Head: Normocephalic and atraumatic.  Mouth/Throat: Abnormal dentition.  Eyes: Conjunctivae are normal. Right eye exhibits no discharge. Left eye exhibits no discharge. No scleral icterus.  Cardiovascular: Normal rate, regular rhythm, S1 normal, S2 normal and normal heart sounds.   No murmur heard. No edema   Pulmonary/Chest: Effort normal and breath sounds normal.  Abdominal: Soft. Bowel sounds are normal. He exhibits no distension. There is no tenderness.  Obese abdomen  Musculoskeletal: He exhibits no edema.  Neurological: He is alert and  oriented to person, place, and time. Gait normal.  Skin: Skin is warm, dry and intact. No rash noted.  Psychiatric: He has a normal mood and affect. His speech is normal and behavior is normal. Judgment and thought content normal. Cognition and memory are normal.          Assessment & Plan:  F/u in 6 months sooner if needed

## 2013-05-15 ENCOUNTER — Telehealth: Payer: Self-pay | Admitting: Licensed Clinical Social Worker

## 2013-05-15 NOTE — Addendum Note (Signed)
Addended by: Cresenciano Genre on: 05/15/2013 11:23 AM   Modules accepted: Orders

## 2013-05-15 NOTE — Addendum Note (Signed)
Addended by: Cresenciano Genre on: 05/15/2013 11:27 AM   Modules accepted: Orders

## 2013-05-15 NOTE — Telephone Encounter (Signed)
[name] [name] was referred to CSW for information regarding [employer] financial assistance.  CSW placed call to [name] [name], left message inquiring if pt received application for [employer] program.  If not, CSW could mail application or pt can stop by any [employer] and inquire about the [employer] application.  CSW left message requesting return call. CSW provided contact hours and phone number.

## 2013-05-18 ENCOUNTER — Encounter: Payer: Self-pay | Admitting: Licensed Clinical Social Worker

## 2013-05-18 NOTE — Telephone Encounter (Signed)
CSW placed call to [name] [name] to assist with questions pt may have regarding benefits.  Pt inquired about [employer] .  CSW informed pt about the [employer] open doors program.  CSW offered to place application in mail or if pt wanted to stop by [facility].  Pt requesting CSW to place application in mail.  Pt denies add'l needs at this time.  [name] [name] aware CSW is available to assist as needed.  Letter and application mailed.

## 2013-05-18 NOTE — Addendum Note (Signed)
Addended by: Cresenciano Genre on: 05/18/2013 10:56 AM   Modules accepted: Orders

## 2013-05-18 NOTE — Progress Notes (Signed)
Case discussed with Dr. McLean at time of visit.  We reviewed the resident's history and exam and pertinent patient test results.  I agree with the assessment, diagnosis, and plan of care documented in the resident's note.  

## 2013-05-29 ENCOUNTER — Telehealth: Payer: Self-pay | Admitting: *Deleted

## 2013-05-29 NOTE — Telephone Encounter (Signed)
Pt's here at the clinic - c/o indigestion and states Prilosec is not helping.  Wants to try something else.  [place].

## 2013-05-30 ENCOUNTER — Telehealth: Payer: Self-pay | Admitting: Internal Medicine

## 2013-05-30 ENCOUNTER — Other Ambulatory Visit: Payer: Self-pay | Admitting: Internal Medicine

## 2013-05-30 DIAGNOSIS — Z202 Contact with and (suspected) exposure to infections with a predominantly sexual mode of transmission: Secondary | ICD-10-CM | POA: Insufficient documentation

## 2013-05-30 MED ORDER — PANTOPRAZOLE SODIUM 40 MG PO TBEC: 40.0000 mg | DELAYED_RELEASE_TABLET | Freq: Every day | ORAL | Status: DC

## 2013-05-30 MED ORDER — METRONIDAZOLE 500 MG PO TABS: 2000.0000 mg | ORAL_TABLET | Freq: Once | ORAL | Status: AC

## 2013-05-30 NOTE — Telephone Encounter (Signed)
I recently saw Ms. [name] [name] in the clinic for Dyspareunia, a recent U/A in the ED for her was positive for Trichomoniasis. She reported her only sexual partner was [name] who is also seen in our clinic. When I spoke with [name], MR Dobos was also present and I informed him that he is most likely a asymptomatic carrier (he reports no current symptoms).  I have instructed both of them I will provide a one time dose of Flagyl 2000mg .  He voiced understanding.  All questions were answered.

## 2013-05-30 NOTE — Assessment & Plan Note (Signed)
-  Flagyl 2000mg  x1 sent to pharmacy, patient notified.

## 2013-06-03 ENCOUNTER — Ambulatory Visit: Payer: Medicaid Other | Admitting: Internal Medicine

## 2013-06-05 ENCOUNTER — Other Ambulatory Visit (HOSPITAL_COMMUNITY)
Admission: RE | Admit: 2013-06-05 | Discharge: 2013-06-05 | Disposition: A | Discharge status: Home / Self Care | Payer: Medicaid Other | Source: Ambulatory Visit | Attending: Internal Medicine | Admitting: Internal Medicine

## 2013-06-05 ENCOUNTER — Ambulatory Visit (INDEPENDENT_AMBULATORY_CARE_PROVIDER_SITE_OTHER): Payer: Medicaid Other | Admitting: Internal Medicine

## 2013-06-05 ENCOUNTER — Encounter: Payer: Self-pay | Admitting: Internal Medicine

## 2013-06-05 VITALS — BP 144/97 | HR 96 | Temp 97.9°F | Ht 66.0 in | Wt 238.0 lb

## 2013-06-05 DIAGNOSIS — Z113 Encounter for screening for infections with a predominantly sexual mode of transmission: Secondary | ICD-10-CM | POA: Insufficient documentation

## 2013-06-05 DIAGNOSIS — Z202 Contact with and (suspected) exposure to infections with a predominantly sexual mode of transmission: Secondary | ICD-10-CM

## 2013-06-05 NOTE — Progress Notes (Signed)
   Subjective:    Patient ID: [name] [name], male    DOB: January 23, 1956, 58 y.o.   MRN: [id number]  HPI  Presents for assessment after girlfriend tested positive for Trichomonas.  Denies dysuria, penile discharge, burning, abdominal pain or fevers. Pt states that he doesn't think that he "has it" and doesn't know where his girl friend "got it from". Has not picked up empiric Flagyl called in by Dr. Heber [place] who treated his girlfriend.  States that the pharmacy told him that "it was not there".  Review of Systems  Constitutional: Negative for fever.  HENT: Negative.   Respiratory: Negative.   Cardiovascular: Negative.   Gastrointestinal: Negative.   Genitourinary: Negative for dysuria, discharge, penile swelling, scrotal swelling, genital sores, penile pain and testicular pain.  Musculoskeletal: Negative.   Skin: Negative for rash.  Neurological: Negative for dizziness and headaches.  Psychiatric/Behavioral: Negative.        Objective:   Physical Exam  Constitutional: He is oriented to person, place, and time. He appears well-developed and well-nourished. No distress.  HENT:  Head: Normocephalic and atraumatic.  Eyes: Conjunctivae and EOM are normal. Pupils are equal, round, and reactive to light.  Neck: Normal range of motion.  Cardiovascular: Normal rate, regular rhythm and intact distal pulses.   No murmur heard. Pulmonary/Chest: Effort normal and breath sounds normal. He has no wheezes.  Abdominal: Soft. Bowel sounds are normal.  Genitourinary:  Pt declines penile exam  Musculoskeletal: Normal range of motion.  Neurological: He is alert and oriented to person, place, and time.  Skin: Skin is warm and dry.  Psychiatric: He has a normal mood and affect.          Assessment & Plan:  See separate problem-list charting:

## 2013-06-05 NOTE — Patient Instructions (Signed)
Your prescription is available at the [facility] in [facility]. Please take it and use protection until you have taken the medicine. Will will contact you with the results of your test.  Please bring your medicines with you each time you come.   Medicines may be:  Eye drops  Herbal   Vitamins  Pills  Seeing these help [place] take care of you.

## 2013-06-08 LAB — URINE CYTOLOGY ANCILLARY ONLY
Chlamydia: NEGATIVE
NEISSERIA GONORRHEA: NEGATIVE
Trichomonas: NEGATIVE

## 2013-06-11 NOTE — Progress Notes (Signed)
Case discussed with Dr. Schooler at the time of the visit.  We reviewed the resident's history and exam and pertinent patient test results.  I agree with the assessment, diagnosis, and plan of care documented in the resident's note.     

## 2013-06-11 NOTE — Assessment & Plan Note (Signed)
Pt's girlfriend recently dx with Trichomonas in [facility].  Rx for Metronidazole sent in for [name] [name] as well but pt states that the pills "were not there".  He does not believe that he gave his girlfriend Trichomonas and has since had unprotected sex with her.  He is willing to take the medication.  -Rx Metronidazole 2 gm x1  ADDENDUM: Urine cytology negative for Gonnorhea, Chlamydia, and Trichomonas -will inform patient

## 2013-06-21 ENCOUNTER — Encounter (HOSPITAL_BASED_OUTPATIENT_CLINIC_OR_DEPARTMENT_OTHER): Payer: Medicaid Other

## 2013-07-22 ENCOUNTER — Ambulatory Visit (HOSPITAL_BASED_OUTPATIENT_CLINIC_OR_DEPARTMENT_OTHER): Payer: Medicaid Other | Attending: Internal Medicine

## 2013-08-05 ENCOUNTER — Encounter: Payer: Self-pay | Admitting: Internal Medicine

## 2013-08-05 ENCOUNTER — Ambulatory Visit (INDEPENDENT_AMBULATORY_CARE_PROVIDER_SITE_OTHER): Payer: Medicaid Other | Admitting: Internal Medicine

## 2013-08-05 VITALS — BP 123/86 | HR 92 | Temp 97.0°F | Ht 66.0 in | Wt 235.3 lb

## 2013-08-05 DIAGNOSIS — I10 Essential (primary) hypertension: Secondary | ICD-10-CM

## 2013-08-05 DIAGNOSIS — R0681 Apnea, not elsewhere classified: Secondary | ICD-10-CM

## 2013-08-05 DIAGNOSIS — K219 Gastro-esophageal reflux disease without esophagitis: Secondary | ICD-10-CM

## 2013-08-05 DIAGNOSIS — G4733 Obstructive sleep apnea (adult) (pediatric): Secondary | ICD-10-CM

## 2013-08-05 DIAGNOSIS — K5900 Constipation, unspecified: Secondary | ICD-10-CM

## 2013-08-05 DIAGNOSIS — E785 Hyperlipidemia, unspecified: Secondary | ICD-10-CM

## 2013-08-05 MED ORDER — POLYETHYLENE GLYCOL 3350 17 G PO PACK: 17.0000 g | PACK | Freq: Every day | ORAL | Status: DC | PRN

## 2013-08-05 MED ORDER — CETIRIZINE HCL 10 MG PO TABS: 10.0000 mg | ORAL_TABLET | Freq: Every day | ORAL | Status: DC

## 2013-08-05 MED ORDER — PANTOPRAZOLE SODIUM 40 MG PO TBEC: 40.0000 mg | DELAYED_RELEASE_TABLET | Freq: Every day | ORAL | Status: DC

## 2013-08-05 MED ORDER — DOCUSATE SODIUM 100 MG PO CAPS: 100.0000 mg | ORAL_CAPSULE | Freq: Two times a day (BID) | ORAL | Status: DC

## 2013-08-05 MED ORDER — LISINOPRIL-HYDROCHLOROTHIAZIDE 10-12.5 MG PO TABS: 1.0000 | ORAL_TABLET | Freq: Every day | ORAL | Status: DC

## 2013-08-05 NOTE — Patient Instructions (Signed)
Take all the medications as advised. Take Colace 1 tablet twice daily and use Miralax as needed for constipation. If your constipation does not get better, please call us or seek medical help.

## 2013-08-05 NOTE — Assessment & Plan Note (Signed)
Well controlled.  Plans: Continue current regimen. 

## 2013-08-05 NOTE — Assessment & Plan Note (Signed)
Patient had a sleep study in the past and underwent a trial of CPAP. Patient reports that he didn't like the cpap and doesn't want to reconsider it.

## 2013-08-05 NOTE — Assessment & Plan Note (Signed)
Constipation for 2 weeks durations.  Plans: Treat with Colace and prn Miralax. Follow up as needed.

## 2013-08-05 NOTE — Progress Notes (Signed)
Subjective:   Patient ID: [name] [name] male   DOB: [birth year] 58 y.o.   MRN: [id number]  HPI: [name] is a 58 y.o. woman with PMH significant for HTN, OSA comes to the office for medication refills.  Patient reports that he needs refills on all his meds.   Patient also reports that he has been having constipation for the last 2 weeks and requesting for some treatment. He denies any abdominal pain, nausea, vomiting, fever, or any other symptoms.   Past Medical History  Diagnosis Date  . Hypertension   . GERD (gastroesophageal reflux disease)   . HLD (hyperlipidemia)   . Varicose veins of both lower extremities with pain     right>left  . Colon polyps     noted 10/2011 colonoscopy  . Diverticulosis     mild noted 10/2011  . Pyrosis   . Hiatal hernia     small noted 10/2011  . Parasomnia     movement with sleep  . Hiatal hernia     small   Current Outpatient Prescriptions  Medication Sig Dispense Refill  . albuterol (PROVENTIL HFA) 108 (90 BASE) MCG/ACT inhaler Inhale 2 puffs into the lungs every 6 (six) hours as needed for wheezing.  1 Inhaler  11  . aspirin (ASPIR-81) 81 MG EC tablet Take 81 mg by mouth daily.        . cetirizine (ZYRTEC) 10 MG tablet Take 1 tablet (10 mg total) by mouth daily.  30 tablet  5  . docusate sodium (COLACE) 100 MG capsule Take 1 capsule (100 mg total) by mouth 2 (two) times daily.  30 capsule  1  . lisinopril-hydrochlorothiazide (PRINZIDE,ZESTORETIC) 10-12.5 MG per tablet Take 1 tablet by mouth daily.  30 tablet  11  . pantoprazole (PROTONIX) 40 MG tablet Take 1 tablet (40 mg total) by mouth daily.  30 tablet  2  . polyethylene glycol (MIRALAX) packet Take 17 g by mouth daily as needed.  14 each  3   No current facility-administered medications for this visit.   Family History  Problem Relation Age of Onset  . Cancer Mother     died at age 43 uterine cancer   . Diabetes Father   . Crohn's disease Son   . Hypertension Other      uncle   History   Social History  . Marital Status: Legally Separated    Spouse Name: N/A    Number of Children: 3  . Years of Education: N/A   Occupational History  .  [employer]   Social History Main Topics  . Smoking status: Never Smoker   . Smokeless tobacco: Never Used  . Alcohol Use: 1.2 oz/week    2 Cans of beer per week     Comment: Beer sometimes occasionally 1 beer not everyday  . Drug Use: No  . Sexual Activity: None   Other Topics Concern  . None   Social History Narrative   Financial assistance approved for 100% discount at [facility] and has [facility] card   [employer] March 03, 2010   Review of Systems: Pertinent items are noted in HPI. Objective:  Physical Exam: Filed Vitals:   08/05/13 1454  BP: 123/86  Pulse: 92  Temp: 97 F (36.1 C)  TempSrc: Oral  Height: 5\' 6"  (1.676 m)  Weight: 235 lb 4.8 oz (106.731 kg)  SpO2: 99%   Constitutional: Vital signs reviewed.   Patient is a well-developed and well-nourished and is  in no acute distress and cooperative with exam. Alert and oriented x3.  Eyes: Conjunctivae normal, No scleral icterus.  Cardiovascular: RRR, S1 normal, S2 normal, no MRG. Pulmonary/Chest: normal respiratory effort, CTAB, no wheezes, rales, or rhonchi Abdominal: Soft. Non-tender, non-distended, bowel sounds are normal, no masses, organomegaly, or guarding present.  GU: no CVA tenderness Neurological: A&O x3. Skin: Warm, dry and intact. Psychiatric: Normal mood and affect.  Assessment & Plan:

## 2013-08-05 NOTE — Assessment & Plan Note (Signed)
Check lipid panel.  Plans: Will follow up FLP.

## 2013-08-06 NOTE — Progress Notes (Signed)
Case discussed with Dr. Boggala at the time of the visit.  We reviewed the resident's history and exam and pertinent patient test results.  I agree with the assessment, diagnosis, and plan of care documented in the resident's note. 

## 2013-09-24 NOTE — Addendum Note (Signed)
Addended by: Truddie Crumble on: 09/24/2013 09:23 AM   Modules accepted: Orders

## 2013-11-28 ENCOUNTER — Encounter (HOSPITAL_COMMUNITY): Payer: Self-pay | Admitting: Emergency Medicine

## 2013-11-28 ENCOUNTER — Emergency Department (HOSPITAL_COMMUNITY)
Admission: EM | Admit: 2013-11-28 | Discharge: 2013-11-28 | Disposition: A | Discharge status: Home / Self Care | Payer: Medicaid Other | Attending: Emergency Medicine | Admitting: Emergency Medicine

## 2013-11-28 ENCOUNTER — Emergency Department (HOSPITAL_COMMUNITY): Payer: Medicaid Other

## 2013-11-28 DIAGNOSIS — Z7982 Long term (current) use of aspirin: Secondary | ICD-10-CM | POA: Insufficient documentation

## 2013-11-28 DIAGNOSIS — Z860100 Personal history of colon polyps, unspecified: Secondary | ICD-10-CM | POA: Insufficient documentation

## 2013-11-28 DIAGNOSIS — Z862 Personal history of diseases of the blood and blood-forming organs and certain disorders involving the immune mechanism: Secondary | ICD-10-CM | POA: Diagnosis not present

## 2013-11-28 DIAGNOSIS — Z8659 Personal history of other mental and behavioral disorders: Secondary | ICD-10-CM | POA: Diagnosis not present

## 2013-11-28 DIAGNOSIS — I10 Essential (primary) hypertension: Secondary | ICD-10-CM | POA: Diagnosis not present

## 2013-11-28 DIAGNOSIS — Z79899 Other long term (current) drug therapy: Secondary | ICD-10-CM | POA: Insufficient documentation

## 2013-11-28 DIAGNOSIS — K219 Gastro-esophageal reflux disease without esophagitis: Secondary | ICD-10-CM | POA: Diagnosis not present

## 2013-11-28 DIAGNOSIS — R509 Fever, unspecified: Secondary | ICD-10-CM | POA: Diagnosis present

## 2013-11-28 DIAGNOSIS — R110 Nausea: Secondary | ICD-10-CM | POA: Diagnosis not present

## 2013-11-28 DIAGNOSIS — J209 Acute bronchitis, unspecified: Secondary | ICD-10-CM | POA: Diagnosis not present

## 2013-11-28 DIAGNOSIS — Z8639 Personal history of other endocrine, nutritional and metabolic disease: Secondary | ICD-10-CM | POA: Insufficient documentation

## 2013-11-28 DIAGNOSIS — J40 Bronchitis, not specified as acute or chronic: Secondary | ICD-10-CM

## 2013-11-28 LAB — URINALYSIS, ROUTINE W REFLEX MICROSCOPIC
Bilirubin Urine: NEGATIVE
GLUCOSE, UA: NEGATIVE mg/dL
HGB URINE DIPSTICK: NEGATIVE
Ketones, ur: NEGATIVE mg/dL
Leukocytes, UA: NEGATIVE
Nitrite: NEGATIVE
PROTEIN: NEGATIVE mg/dL
Specific Gravity, Urine: 1.018 (ref 1.005–1.030)
UROBILINOGEN UA: 1 mg/dL (ref 0.0–1.0)
pH: 8 (ref 5.0–8.0)

## 2013-11-28 LAB — CBC WITH DIFFERENTIAL/PLATELET
Basophils Absolute: 0 10*3/uL (ref 0.0–0.1)
Basophils Relative: 0 % (ref 0–1)
EOS PCT: 1 % (ref 0–5)
Eosinophils Absolute: 0.1 10*3/uL (ref 0.0–0.7)
HCT: 38 % — ABNORMAL LOW (ref 39.0–52.0)
Hemoglobin: 12.5 g/dL — ABNORMAL LOW (ref 13.0–17.0)
LYMPHS ABS: 1.2 10*3/uL (ref 0.7–4.0)
LYMPHS PCT: 13 % (ref 12–46)
MCH: 30.3 pg (ref 26.0–34.0)
MCHC: 32.9 g/dL (ref 30.0–36.0)
MCV: 92.2 fL (ref 78.0–100.0)
Monocytes Absolute: 1.2 10*3/uL — ABNORMAL HIGH (ref 0.1–1.0)
Monocytes Relative: 14 % — ABNORMAL HIGH (ref 3–12)
Neutro Abs: 6.3 10*3/uL (ref 1.7–7.7)
Neutrophils Relative %: 72 % (ref 43–77)
PLATELETS: 177 10*3/uL (ref 150–400)
RBC: 4.12 MIL/uL — ABNORMAL LOW (ref 4.22–5.81)
RDW: 13.6 % (ref 11.5–15.5)
WBC: 8.8 10*3/uL (ref 4.0–10.5)

## 2013-11-28 LAB — COMPREHENSIVE METABOLIC PANEL
ALK PHOS: 72 U/L (ref 39–117)
ALT: 28 U/L (ref 0–53)
AST: 33 U/L (ref 0–37)
Albumin: 3.7 g/dL (ref 3.5–5.2)
Anion gap: 13 (ref 5–15)
BUN: 15 mg/dL (ref 6–23)
CALCIUM: 9 mg/dL (ref 8.4–10.5)
CHLORIDE: 100 meq/L (ref 96–112)
CO2: 26 meq/L (ref 19–32)
Creatinine, Ser: 1.11 mg/dL (ref 0.50–1.35)
GFR calc Af Amer: 83 mL/min — ABNORMAL LOW (ref >90)
GFR, EST NON AFRICAN AMERICAN: 72 mL/min — AB (ref >90)
Glucose, Bld: 109 mg/dL — ABNORMAL HIGH (ref 70–99)
POTASSIUM: 4.5 meq/L (ref 3.7–5.3)
SODIUM: 139 meq/L (ref 137–147)
Total Bilirubin: 0.4 mg/dL (ref 0.3–1.2)
Total Protein: 7.6 g/dL (ref 6.0–8.3)

## 2013-11-28 LAB — I-STAT CG4 LACTIC ACID, ED: Lactic Acid, Venous: 1.08 mmol/L (ref 0.5–2.2)

## 2013-11-28 MED ORDER — SODIUM CHLORIDE 0.9 % IV BOLUS (SEPSIS)
1000.0000 mL | Freq: Once | INTRAVENOUS | Status: AC
  Administered 2013-11-28: 1000 mL via INTRAVENOUS

## 2013-11-28 MED ORDER — ACETAMINOPHEN 325 MG PO TABS
650.0000 mg | ORAL_TABLET | Freq: Four times a day (QID) | ORAL | Status: DC | PRN
  Administered 2013-11-28: 650 mg via ORAL
  Filled 2013-11-28: qty 2

## 2013-11-28 MED ORDER — IBUPROFEN 200 MG PO TABS
600.0000 mg | ORAL_TABLET | Freq: Once | ORAL | Status: AC
  Administered 2013-11-28: 600 mg via ORAL
  Filled 2013-11-28: qty 3

## 2013-11-28 MED ORDER — BENZONATATE 100 MG PO CAPS: 100.0000 mg | ORAL_CAPSULE | Freq: Three times a day (TID) | ORAL | Status: DC | PRN

## 2013-11-28 MED ORDER — LEVOFLOXACIN 750 MG PO TABS
750.0000 mg | ORAL_TABLET | Freq: Once | ORAL | Status: AC
  Administered 2013-11-28: 750 mg via ORAL
  Filled 2013-11-28: qty 1

## 2013-11-28 MED ORDER — LEVOFLOXACIN 750 MG PO TABS: 750.0000 mg | ORAL_TABLET | Freq: Every day | ORAL | Status: DC

## 2013-11-28 NOTE — Discharge Instructions (Signed)

## 2013-11-28 NOTE — ED Notes (Signed)
Per EMS: Pt from home for eval of nausea and fever x3 days, pt family states pt has been "acting different" today, EMS noted pt axo x3, disoriented to time upon arrival. EMS noted pt hot to touch, unable to get an accurate temperature. Pt also reports congested, productive cough with yellow sputum and also dark colored urine for the past few days. EMS also noted pt ankles hot to touch and per pt, ankles painful. nad noted upon arrival. EDP at bedside.

## 2013-11-28 NOTE — ED Provider Notes (Signed)
CSN: [id number]     Arrival date & time 11/28/13  0049 History   First MD Initiated Contact with Patient 11/28/13 0100     Chief Complaint  Patient presents with  . Fever  . Nausea     (Consider location/radiation/quality/duration/timing/severity/associated sxs/prior Treatment) HPI Patient presents with fever, productive cough for 2 days. He's had mild shortness of breath. Admits to nasal congestion. Denies sore throat.  Denies chest pain. Denies headache or neck stiffness. He's had no new lower extremity swelling. States has several close contacts with URI symptoms. Past Medical History  Diagnosis Date  . Hypertension   . GERD (gastroesophageal reflux disease)   . HLD (hyperlipidemia)   . Varicose veins of both lower extremities with pain     right>left  . Colon polyps     noted 10/2011 colonoscopy  . Diverticulosis     mild noted 10/2011  . Pyrosis   . Hiatal hernia     small noted 10/2011  . Parasomnia     movement with sleep  . Hiatal hernia     small   History reviewed. No pertinent past surgical history. Family History  Problem Relation Age of Onset  . Cancer Mother     died at age 68 uterine cancer   . Diabetes Father   . Crohn's disease Son   . Hypertension Other     uncle   History  Substance Use Topics  . Smoking status: Never Smoker   . Smokeless tobacco: Never Used  . Alcohol Use: 1.2 oz/week    2 Cans of beer per week     Comment: Beer sometimes occasionally 1 beer not everyday    Review of Systems  Constitutional: Positive for fever. Negative for chills.  HENT: Positive for congestion and rhinorrhea. Negative for sinus pressure and sore throat.   Respiratory: Positive for cough. Negative for shortness of breath.   Cardiovascular: Negative for chest pain, palpitations and leg swelling.  Gastrointestinal: Negative for nausea, vomiting, abdominal pain and diarrhea.  Genitourinary: Negative for dysuria, hematuria and flank pain.  Musculoskeletal:  Negative for back pain, neck pain and neck stiffness.  Skin: Negative for rash and wound.  Neurological: Negative for dizziness, syncope, weakness, light-headedness, numbness and headaches.  All other systems reviewed and are negative.     Allergies  Review of patient's allergies indicates no known allergies.  Home Medications   Prior to Admission medications   Medication Sig Start Date End Date Taking? Authorizing Provider  albuterol (PROVENTIL HFA) 108 (90 BASE) MCG/ACT inhaler Inhale 2 puffs into the lungs every 6 (six) hours as needed for wheezing. 05/14/13 02/11/15 Yes Cresenciano Genre, MD  aspirin 325 MG tablet Take 325 mg by mouth daily.   Yes Historical Provider, MD  cetirizine (ZYRTEC) 10 MG tablet Take 1 tablet (10 mg total) by mouth daily. 08/05/13  Yes Vijaya Mercer Pod, MD  diphenhydrAMINE (BENADRYL) 25 MG tablet Take 25 mg by mouth every 6 (six) hours as needed for allergies.   Yes Historical Provider, MD  ibuprofen (ADVIL,MOTRIN) 200 MG tablet Take 200 mg by mouth every 6 (six) hours as needed for moderate pain.   Yes Historical Provider, MD  lisinopril-hydrochlorothiazide (PRINZIDE,ZESTORETIC) 10-12.5 MG per tablet Take 1 tablet by mouth daily. 08/05/13  Yes Vijaya Mercer Pod, MD  Olopatadine HCl (PATADAY) 0.2 % SOLN Place 1 drop into both eyes every morning.   Yes Historical Provider, MD  pantoprazole (PROTONIX) 40 MG tablet Take 1 tablet (40 mg total)  by mouth daily. 08/05/13  Yes Vijaya Mercer Pod, MD   BP 109/80  Pulse 94  Temp(Src) 99.2 F (37.3 C) (Oral)  Resp 18  SpO2 96% Physical Exam  Nursing note and vitals reviewed. Constitutional: He is oriented to person, place, and time. He appears well-developed and well-nourished. No distress.  HENT:  Head: Normocephalic and atraumatic.  Mouth/Throat: Oropharynx is clear and moist. No oropharyngeal exudate.  No sinus tenderness to percussion  Eyes: EOM are normal. Pupils are equal, round, and reactive to  light.  Neck: Normal range of motion. Neck supple.  No neck stiffness  Cardiovascular: Normal rate and regular rhythm.  Exam reveals no gallop and no friction rub.   No murmur heard. Pulmonary/Chest: Effort normal. No respiratory distress. He has no wheezes. He has rales. He exhibits no tenderness.  Scattered rhonchi  Abdominal: Soft. Bowel sounds are normal. He exhibits no distension and no mass. There is no tenderness. There is no rebound and no guarding.  Musculoskeletal: Normal range of motion. He exhibits no edema and no tenderness.  Neurological: He is alert and oriented to person, place, and time.  Moves all extremities without deficit. Sensation grossly intact  Skin: Skin is warm and dry. No rash noted. No erythema.  Psychiatric: He has a normal mood and affect. His behavior is normal.    ED Course  Procedures (including critical care time) Labs Review Labs Reviewed  CBC WITH DIFFERENTIAL - Abnormal; Notable for the following:    RBC 4.12 (*)    Hemoglobin 12.5 (*)    HCT 38.0 (*)    Monocytes Relative 14 (*)    Monocytes Absolute 1.2 (*)    All other components within normal limits  COMPREHENSIVE METABOLIC PANEL - Abnormal; Notable for the following:    Glucose, Bld 109 (*)    GFR calc non Af Amer 72 (*)    GFR calc Af Amer 83 (*)    All other components within normal limits  CULTURE, BLOOD (ROUTINE X 2)  CULTURE, BLOOD (ROUTINE X 2)  URINALYSIS, ROUTINE W REFLEX MICROSCOPIC  I-STAT CG4 LACTIC ACID, ED    Imaging Review Dg Chest 2 View  11/28/2013   CLINICAL DATA:  Cold symptoms for 3 days.  EXAM: CHEST  2 VIEW  COMPARISON:  04/30/2011  FINDINGS: The heart size and mediastinal contours are within normal limits. Both lungs are clear. The visualized skeletal structures are unremarkable.  IMPRESSION: No active cardiopulmonary disease.   Electronically Signed   By: Lucienne Capers M.D.   On: 11/28/2013 02:49     EKG Interpretation None      MDM   Final diagnoses:   None   patient's fever is improved in the emergency department. Normal white blood cell count. No evidence of pneumonia on chest x-ray. Likely the patient has bronchitis or early developing pneumonia. Will give oral Levaquin here and discharged with prescription. Patient been given return precautions and is voiced understanding.      Julianne Rice, MD 11/28/13 [phone]

## 2013-12-04 LAB — CULTURE, BLOOD (ROUTINE X 2)
Culture: NO GROWTH
Culture: NO GROWTH

## 2014-01-07 ENCOUNTER — Encounter (HOSPITAL_COMMUNITY): Payer: Medicaid Other

## 2014-01-07 ENCOUNTER — Encounter: Payer: Medicaid Other | Admitting: Internal Medicine

## 2014-01-07 ENCOUNTER — Ambulatory Visit (HOSPITAL_COMMUNITY)
Admission: RE | Admit: 2014-01-07 | Discharge: 2014-01-07 | Disposition: A | Discharge status: Home / Self Care | Payer: Medicaid Other | Source: Ambulatory Visit | Attending: Internal Medicine | Admitting: Internal Medicine

## 2014-01-07 ENCOUNTER — Ambulatory Visit (INDEPENDENT_AMBULATORY_CARE_PROVIDER_SITE_OTHER): Payer: Medicaid Other | Admitting: Internal Medicine

## 2014-01-07 ENCOUNTER — Encounter: Payer: Self-pay | Admitting: Internal Medicine

## 2014-01-07 VITALS — BP 112/73 | HR 84 | Temp 97.7°F | Ht 66.0 in | Wt 235.8 lb

## 2014-01-07 DIAGNOSIS — R0789 Other chest pain: Secondary | ICD-10-CM

## 2014-01-07 DIAGNOSIS — E669 Obesity, unspecified: Secondary | ICD-10-CM

## 2014-01-07 DIAGNOSIS — M79604 Pain in right leg: Secondary | ICD-10-CM | POA: Diagnosis present

## 2014-01-07 DIAGNOSIS — M79609 Pain in unspecified limb: Secondary | ICD-10-CM

## 2014-01-07 DIAGNOSIS — I8390 Asymptomatic varicose veins of unspecified lower extremity: Secondary | ICD-10-CM | POA: Insufficient documentation

## 2014-01-07 DIAGNOSIS — J302 Other seasonal allergic rhinitis: Secondary | ICD-10-CM

## 2014-01-07 DIAGNOSIS — Z23 Encounter for immunization: Secondary | ICD-10-CM

## 2014-01-07 DIAGNOSIS — R0681 Apnea, not elsewhere classified: Secondary | ICD-10-CM

## 2014-01-07 DIAGNOSIS — K5900 Constipation, unspecified: Secondary | ICD-10-CM

## 2014-01-07 DIAGNOSIS — E785 Hyperlipidemia, unspecified: Secondary | ICD-10-CM

## 2014-01-07 DIAGNOSIS — Z0000 Encounter for general adult medical examination without abnormal findings: Secondary | ICD-10-CM

## 2014-01-07 DIAGNOSIS — I83891 Varicose veins of right lower extremities with other complications: Secondary | ICD-10-CM

## 2014-01-07 DIAGNOSIS — I10 Essential (primary) hypertension: Secondary | ICD-10-CM

## 2014-01-07 DIAGNOSIS — K219 Gastro-esophageal reflux disease without esophagitis: Secondary | ICD-10-CM

## 2014-01-07 LAB — TROPONIN I: Troponin I: <0.3 ng/mL (ref <0.30)

## 2014-01-07 LAB — LIPID PANEL
CHOL/HDL RATIO: 4.5 ratio
Cholesterol: 206 mg/dL — ABNORMAL HIGH (ref 0–200)
HDL: 46 mg/dL (ref >39)
LDL CALC: 142 mg/dL — AB (ref 0–99)
Triglycerides: 91 mg/dL (ref <150)
VLDL: 18 mg/dL (ref 0–40)

## 2014-01-07 MED ORDER — PANTOPRAZOLE SODIUM 40 MG PO TBEC: 40.0000 mg | DELAYED_RELEASE_TABLET | Freq: Every day | ORAL | Status: DC

## 2014-01-07 MED ORDER — CETIRIZINE HCL 10 MG PO TABS: 10.0000 mg | ORAL_TABLET | Freq: Every day | ORAL | Status: DC

## 2014-01-07 NOTE — Progress Notes (Addendum)
   Subjective:    Patient ID: [name] [name], male    DOB: 18-May-1955, 58 y.o.   MRN: [id number]  HPI Comments: 58 y.o PMH OSA (not on cpap needs repeat study to get another cpap), severe varicose veins lower extremities (intermittently using compression stockings), HTN (BP 112/73), HLD, GERD, small hiatal hernia, chronic constipation, benign colon polyps, diverticulosis.    He presents for f/u and needs medication refills. 1. He needs med refills of Zyrtec and Protonix. GERD sx's controlled with Protonix though if he eats and lies down he feels nauseated.  2. Recent ED visit for bronchitis sxs resolved with Levaquin  3. He c/o RLE pain 2/10 today nothing makes worse or better. He has a h/o varicose veins and is intermittently wearing compression stockings. Requests another pair 4. His constipation is improving with prn Miralax and Colace  5. He c/o chest tightness with exertion last week he is unsure if related to heart burn.  He has chronic SOB that is stable and denies n/v/sweating with chest sx's last week   ROS per above    HM: due to flu and Tdap, will check lipid panel today  SH: currently w/o car b/c his car needs a motor. He did get approved for disability, interested in working out at [employer], denies smoking history      Review of Systems  Respiratory: Negative for shortness of breath.   Cardiovascular: Negative for chest pain.       Objective:   Physical Exam  Nursing note and vitals reviewed. Constitutional: He is oriented to person, place, and time. Vital signs are normal. He appears well-developed and well-nourished. He is cooperative.  HENT:  Head: Normocephalic and atraumatic.  Mouth/Throat: No oropharyngeal exudate.  Eyes: Conjunctivae are normal. Right eye exhibits no discharge. Left eye exhibits no discharge. No scleral icterus.  Cardiovascular: Normal rate, regular rhythm, S1 normal, S2 normal and normal heart sounds.   No murmur heard. No lower ext edema.  RLE with engorged varicose vein on the right less prominent on left lower ext  Pulmonary/Chest: Effort normal and breath sounds normal.  Abdominal: Soft. Bowel sounds are normal. He exhibits no distension. There is no tenderness.  Musculoskeletal: He exhibits no edema.  Neurological: He is alert and oriented to person, place, and time. Gait normal.  Skin: Skin is warm, dry and intact. No rash noted.  Psychiatric: He has a normal mood and affect. His speech is normal and behavior is normal. Judgment and thought content normal. Cognition and memory are normal.          Assessment & Plan:  F/u in 6 months

## 2014-01-07 NOTE — Assessment & Plan Note (Addendum)
Right with bulging veins  Encouraged to wear compression stocking qd and ordered another pair  Will do duplex to r/o DVT

## 2014-01-07 NOTE — Progress Notes (Signed)
Right lower extremity venous duplex completed.  Right:  No evidence of DVT, superficial thrombosis, or Baker's cyst.  Left:  Negative for DVT in the common femoral vein.  

## 2014-01-07 NOTE — Assessment & Plan Note (Signed)
Last stool today  Relieved with Miralax and colace

## 2014-01-07 NOTE — Assessment & Plan Note (Signed)
Will repeat lipid panel today  

## 2014-01-07 NOTE — Assessment & Plan Note (Signed)
Given info on exercise to lose weight

## 2014-01-07 NOTE — Assessment & Plan Note (Signed)
Will give Tdap and flu shot today, check lipids

## 2014-01-07 NOTE — Assessment & Plan Note (Signed)
Rx refill Protonix today

## 2014-01-07 NOTE — Patient Instructions (Signed)
General Instructions: We will check your blood today and get an Ultrasound of your leg  Please follow up in 6 months sooner if needed  Take care    Treatment Goals:  Goals (1 Years of Data) as of 01/07/14         As of Today 11/28/13 11/28/13 11/28/13 11/28/13     Blood Pressure    . Blood Pressure < 140/90  112/73 99/71 102/68 113/82 105/69      Progress Toward Treatment Goals:  Treatment Goal 01/07/2014  Blood pressure at goal    Self Care Goals & Plans:  Self Care Goal 01/07/2014  Manage my medications take my medicines as prescribed; bring my medications to every visit; refill my medications on time  Monitor my health keep track of my blood pressure  Eat healthy foods eat more vegetables; eat foods that are low in salt; eat baked foods instead of fried foods; drink diet soda or water instead of juice or soda; eat fruit for snacks and desserts; eat smaller portions  Be physically active find an activity I enjoy  Meeting treatment goals maintain the current self-care plan    No flowsheet data found.   Care Management & Community Referrals:  Referral 01/07/2014  Referrals made for care management support none needed  Referrals made to community resources none     Tdap Vaccine (Tetanus, Diphtheria, Pertussis): What You Need to Know 1. Why get vaccinated? Tetanus, diphtheria and pertussis can be very serious diseases, even for adolescents and adults. Tdap vaccine can protect [place] from these diseases. TETANUS (Lockjaw) causes painful muscle tightening and stiffness, usually all over the body.  It can lead to tightening of muscles in the head and neck so you can't open your mouth, swallow, or sometimes even breathe. Tetanus kills about 1 out of 5 people who are infected. DIPHTHERIA can cause a thick coating to form in the back of the throat.  It can lead to breathing problems, paralysis, heart failure, and death. PERTUSSIS (Whooping Cough) causes severe coughing spells, which  can cause difficulty breathing, vomiting and disturbed sleep.  It can also lead to weight loss, incontinence, and rib fractures. Up to 2 in 100 adolescents and 5 in 100 adults with pertussis are hospitalized or have complications, which could include pneumonia or death. These diseases are caused by bacteria. Diphtheria and pertussis are spread from person to person through coughing or sneezing. Tetanus enters the body through cuts, scratches, or wounds. Before vaccines, the [place] saw as many as 200,000 cases a year of diphtheria and pertussis, and hundreds of cases of tetanus. Since vaccination began, tetanus and diphtheria have dropped by about 99% and pertussis by about 80%. 2. Tdap vaccine Tdap vaccine can protect adolescents and adults from tetanus, diphtheria, and pertussis. One dose of Tdap is routinely given at age 50 or 75. People who did not get Tdap at that age should get it as soon as possible. Tdap is especially important for health care professionals and anyone having close contact with a baby younger than 12 months. Pregnant women should get a dose of Tdap during every pregnancy, to protect the newborn from pertussis. Infants are most at risk for severe, life-threatening complications from pertussis. A similar vaccine, called Td, protects from tetanus and diphtheria, but not pertussis. A Td booster should be given every 10 years. Tdap may be given as one of these boosters if you have not already gotten a dose. Tdap may also be given after a  severe cut or burn to prevent tetanus infection. Your doctor can give you more information. Tdap may safely be given at the same time as other vaccines. 3. Some people should not get this vaccine  If you ever had a life-threatening allergic reaction after a dose of any tetanus, diphtheria, or pertussis containing vaccine, OR if you have a severe allergy to any part of this vaccine, you should not get Tdap. Tell your doctor if you have any  severe allergies.  If you had a coma, or long or multiple seizures within 7 days after a childhood dose of DTP or DTaP, you should not get Tdap, unless a cause other than the vaccine was found. You can still get Td.  Talk to your doctor if you:  have epilepsy or another nervous system problem,  had severe pain or swelling after any vaccine containing diphtheria, tetanus or pertussis,  ever had Guillain-Barr Syndrome (GBS),  aren't feeling well on the day the shot is scheduled. 4. Risks of a vaccine reaction With any medicine, including vaccines, there is a chance of side effects. These are usually mild and go away on their own, but serious reactions are also possible. Brief fainting spells can follow a vaccination, leading to injuries from falling. Sitting or lying down for about 15 minutes can help prevent these. Tell your doctor if you feel dizzy or light-headed, or have vision changes or ringing in the ears. Mild problems following Tdap (Did not interfere with activities)  Pain where the shot was given (about 3 in 4 adolescents or 2 in 3 adults)  Redness or swelling where the shot was given (about 1 person in 5)  Mild fever of at least 100.80F (up to about 1 in 25 adolescents or 1 in 100 adults)  Headache (about 3 or 4 people in 10)  Tiredness (about 1 person in 3 or 4)  Nausea, vomiting, diarrhea, stomach ache (up to 1 in 4 adolescents or 1 in 10 adults)  Chills, body aches, sore joints, rash, swollen glands (uncommon) Moderate problems following Tdap (Interfered with activities, but did not require medical attention)  Pain where the shot was given (about 1 in 5 adolescents or 1 in 100 adults)  Redness or swelling where the shot was given (up to about 1 in 16 adolescents or 1 in 25 adults)  Fever over 102F (about 1 in 100 adolescents or 1 in 250 adults)  Headache (about 3 in 20 adolescents or 1 in 10 adults)  Nausea, vomiting, diarrhea, stomach ache (up to 1 or 3  people in 100)  Swelling of the entire arm where the shot was given (up to about 3 in 100). Severe problems following Tdap (Unable to perform usual activities; required medical attention)  Swelling, severe pain, bleeding and redness in the arm where the shot was given (rare). A severe allergic reaction could occur after any vaccine (estimated less than 1 in a million doses). 5. What if there is a serious reaction? What should I look for?  Exercise to Lose Weight Exercise and a healthy diet may help you lose weight. Your doctor may suggest specific exercises. EXERCISE IDEAS AND TIPS  Choose low-cost things you enjoy doing, such as walking, bicycling, or exercising to workout videos.  Take stairs instead of the elevator.  Walk during your lunch break.  Park your car further away from work or school.  Go to a gym or an exercise class.  Start with 5 to 10 minutes of exercise each  day. Build up to 30 minutes of exercise 4 to 6 days a week.  Wear shoes with good support and comfortable clothes.  Stretch before and after working out.  Work out until you breathe harder and your heart beats faster.  Drink extra water when you exercise.  Do not do so much that you hurt yourself, feel dizzy, or get very short of breath. Exercises that burn about 150 calories:  Running 1  miles in 15 minutes.  Playing volleyball for 45 to 60 minutes.  Washing and waxing a car for 45 to 60 minutes.  Playing touch football for 45 minutes.  Walking 1  miles in 35 minutes.  Pushing a stroller 1  miles in 30 minutes.  Playing basketball for 30 minutes.  Raking leaves for 30 minutes.  Bicycling 5 miles in 30 minutes.  Walking 2 miles in 30 minutes.  Dancing for 30 minutes.  Shoveling snow for 15 minutes.  Swimming laps for 20 minutes.  Walking up stairs for 15 minutes.  Bicycling 4 miles in 15 minutes.  Gardening for 30 to 45 minutes.  Jumping rope for 15 minutes.  Washing  windows or floors for 45 to 60 minutes. Document Released: 04/14/2010 Document Revised: 06/04/2011 Document Reviewed: 04/14/2010 [facility] Patient Information 2015 [place], [place]. This information is not intended to replace advice given to you by your health care provider. Make sure you discuss any questions you have with your health care provider.   Look for anything that concerns you, such as signs of a severe allergic reaction, very high fever, or behavior changes. Signs of a severe allergic reaction can include hives, swelling of the face and throat, difficulty breathing, a fast heartbeat, dizziness, and weakness. These would start a few minutes to a few hours after the vaccination. What should I do?  If you think it is a severe allergic reaction or other emergency that can't wait, call 9-1-1 or get the person to the nearest hospital. Otherwise, call your doctor.  Afterward, the reaction should be reported to the "Vaccine Adverse Event Reporting System" (VAERS). Your doctor might file this report, or you can do it yourself through the VAERS web site at www.vaers.SamedayNews.es, or by calling 802-530-7358. VAERS is only for reporting reactions. They do not give medical advice.  6. The National Vaccine Injury Compensation Program The [employer] Vaccine Injury Compensation Program (VICP) is a federal program that was created to compensate people who may have been injured by certain vaccines. Persons who believe they may have been injured by a vaccine can learn about the program and about filing a claim by calling 712-361-1809 or visiting the [place] website at GoldCloset.com.ee. 7. How can I learn more?  Ask your doctor.  Call your local or state health department.  Contact the Centers for Disease Control and Prevention (CDC):  Call 618-659-3514 or visit CDC's website at http://hunter.com/. CDC Tdap Vaccine VIS (08/02/11) Document Released: 09/11/2011 Document Revised: 07/27/2013  Document Reviewed: 06/24/2013 [employer] Patient Information 2015 [place], [place]. This information is not intended to replace advice given to you by your health care provider. Make sure you discuss any questions you have with your health care provider.  Varicose Veins Varicose veins are veins that have become enlarged and twisted. CAUSES This condition is the result of valves in the veins not working properly. Valves in the veins help return blood from the leg to the heart. If these valves are damaged, blood flows backwards and backs up into the veins in the leg  near the skin. This causes the veins to become larger. People who are on their feet a lot, who are pregnant, or who are overweight are more likely to develop varicose veins. SYMPTOMS   Bulging, twisted-appearing, bluish veins, most commonly found on the legs.  Leg pain or a feeling of heaviness. These symptoms may be worse at the end of the day.  Leg swelling.  Skin color changes. DIAGNOSIS  Varicose veins can usually be diagnosed with an exam of your legs by your caregiver. He or she may recommend an ultrasound of your leg veins. TREATMENT  Most varicose veins can be treated at home.However, other treatments are available for people who have persistent symptoms or who want to treat the cosmetic appearance of the varicose veins. These include:  Laser treatment of very small varicose veins.  Medicine that is shot (injected) into the vein. This medicine hardens the walls of the vein and closes off the vein. This treatment is called sclerotherapy. Afterwards, you may need to wear clothing or bandages that apply pressure.  Surgery. HOME CARE INSTRUCTIONS   Do not stand or sit in one position for long periods of time. Do not sit with your legs crossed. Rest with your legs raised during the day.  Wear elastic stockings or support hose. Do not wear other tight, encircling garments around the legs, pelvis, or waist.  Walk as much as  possible to increase blood flow.  Raise the foot of your bed at night with 2-inch blocks.  If you get a cut in the skin over the vein and the vein bleeds, lie down with your leg raised and press on it with a clean cloth until the bleeding stops. Then place a bandage (dressing) on the cut. See your caregiver if it continues to bleed or needs stitches. SEEK MEDICAL CARE IF:   The skin around your ankle starts to break down.  You have pain, redness, tenderness, or hard swelling developing in your leg over a vein.  You are uncomfortable due to leg pain. Document Released: 12/20/2004 Document Revised: 06/04/2011 Document Reviewed: 05/08/2010 [facility] Patient Information 2015 [place], [place]. This information is not intended to replace advice given to you by your health care provider. Make sure you discuss any questions you have with your health care provider.

## 2014-01-07 NOTE — Assessment & Plan Note (Addendum)
Denies smoking history so doubt COPD, will r/o ACS with EKG and troponin today  If not improving consider referral to cards for stress test outpatient

## 2014-01-07 NOTE — Assessment & Plan Note (Signed)
Rx refill Zyrtec today

## 2014-01-07 NOTE — Assessment & Plan Note (Signed)
BP Readings from Last 3 Encounters:  01/07/14 112/73  11/28/13 99/71  08/05/13 123/86    Lab Results  Component Value Date   NA 139 11/28/2013   K 4.5 11/28/2013   CREATININE 1.11 11/28/2013    Assessment: Blood pressure control: controlled Progress toward BP goal:  at goal Comments: none  Plan: Medications:  continue current medications (Lis-HCTZ 10-12.5 mg qd) Other plans: disc'ed exercise

## 2014-01-08 ENCOUNTER — Encounter: Payer: Self-pay | Admitting: Internal Medicine

## 2014-01-08 NOTE — Progress Notes (Signed)
Case discussed with Dr. McLean soon after the resident saw the patient.  We reviewed the resident's history and exam and pertinent patient test results.  I agree with the assessment, diagnosis and plan of care documented in the resident's note. 

## 2014-01-14 ENCOUNTER — Encounter: Payer: Self-pay | Admitting: Internal Medicine

## 2014-02-04 ENCOUNTER — Ambulatory Visit (INDEPENDENT_AMBULATORY_CARE_PROVIDER_SITE_OTHER): Payer: Medicare Other | Admitting: Internal Medicine

## 2014-02-04 ENCOUNTER — Encounter: Payer: Self-pay | Admitting: Internal Medicine

## 2014-02-04 VITALS — BP 108/68 | HR 91 | Temp 98.1°F | Ht 66.0 in | Wt 240.4 lb

## 2014-02-04 DIAGNOSIS — J069 Acute upper respiratory infection, unspecified: Secondary | ICD-10-CM | POA: Insufficient documentation

## 2014-02-04 MED ORDER — SALINE SPRAY 0.65 % NA SOLN: 2.0000 | NASAL | Status: DC | PRN

## 2014-02-04 MED ORDER — OXYMETAZOLINE HCL 0.05 % NA SOLN: 2.0000 | Freq: Two times a day (BID) | NASAL | Status: DC

## 2014-02-04 MED ORDER — GUAIFENESIN ER 600 MG PO TB12: 600.0000 mg | ORAL_TABLET | Freq: Two times a day (BID) | ORAL | Status: DC

## 2014-02-04 NOTE — Progress Notes (Signed)
   Subjective:    Patient ID: [name] [name], male    DOB: 1956-02-08, 58 y.o.   MRN: [id number]  HPI Comments: 58 y.o presents for acute visit for  1. Nasal congestion, runny nose, sneezing since Tuesday.  His girlfriend has a cold.  He denies fever, chills, sinus pain, myalgias but cant smell.  He also states he is coughing with yellow phelgm and sometimes he feels like he cant get phelgm up.  He tried Zyretec w/o any help.       Review of Systems  Respiratory: Negative for shortness of breath.   Cardiovascular: Negative for chest pain.       Objective:   Physical Exam  Constitutional: He is oriented to person, place, and time. Vital signs are normal. He appears well-developed and well-nourished. He is cooperative.  HENT:  Head: Normocephalic and atraumatic.  Nose: Right sinus exhibits no maxillary sinus tenderness and no frontal sinus tenderness. Left sinus exhibits no maxillary sinus tenderness and no frontal sinus tenderness.  Mouth/Throat: No oropharyngeal exudate.  No ethmoid ttp  Eyes: Conjunctivae are normal. Pupils are equal, round, and reactive to light. Right eye exhibits no discharge. Left eye exhibits no discharge. No scleral icterus.  Cardiovascular: Normal rate, regular rhythm, S1 normal, S2 normal and normal heart sounds.   No murmur heard. Pulmonary/Chest: Effort normal and breath sounds normal.  Musculoskeletal: He exhibits no edema.  Neurological: He is alert and oriented to person, place, and time. Gait normal.  Skin: Skin is warm, dry and intact. No rash noted.  Psychiatric: He has a normal mood and affect. His speech is normal and behavior is normal. Judgment and thought content normal. Cognition and memory are normal.  Nursing note and vitals reviewed.         Assessment & Plan:  F/u in 1 week if not improved

## 2014-02-04 NOTE — Assessment & Plan Note (Addendum)
Likely rhinovirus Will try Afrin bid x 3 days, nasal saline, Benaryl prn or Zyrtec ,Mucinex q12 hours x 1 week Keep hydrated F/u in 1 week if not improved

## 2014-02-04 NOTE — Patient Instructions (Addendum)
General Instructions: Please use Afrin spray twice a day for 3 days only Use Nasal saline  Try Benadryl or Zyrtec  Use Mucinex 2 pills every 12 hours for 1 week Follow up 1 week if you are not feeling well   Treatment Goals:  Goals (1 Years of Data) as of 02/04/14          As of Today 01/07/14 11/28/13 11/28/13 11/28/13     Blood Pressure   . Blood Pressure < 140/90  108/68 112/73 99/71 102/68 113/82      Progress Toward Treatment Goals:  Treatment Goal 02/04/2014  Blood pressure at goal    Self Care Goals & Plans:  Self Care Goal 02/04/2014  Manage my medications take my medicines as prescribed; bring my medications to every visit; refill my medications on time; follow the sick day instructions if I am sick  Monitor my health keep track of my blood pressure  Eat healthy foods drink diet soda or water instead of juice or soda; eat more vegetables; eat foods that are low in salt; eat baked foods instead of fried foods; eat fruit for snacks and desserts; eat smaller portions  Be physically active find an activity I enjoy  Meeting treatment goals maintain the current self-care plan    No flowsheet data found.   Care Management & Community Referrals:  Referral 02/04/2014  Referrals made for care management support none needed  Referrals made to community resources none      Upper Respiratory Infection, Adult An upper respiratory infection (URI) is also sometimes known as the common cold. The upper respiratory tract includes the nose, sinuses, throat, trachea, and bronchi. Bronchi are the airways leading to the lungs. Most people improve within 1 week, but symptoms can last up to 2 weeks. A residual cough may last even longer.  CAUSES Many different viruses can infect the tissues lining the upper respiratory tract. The tissues become irritated and inflamed and often become very moist. Mucus production is also common. A cold is contagious. You can easily spread the virus to  others by oral contact. This includes kissing, sharing a glass, coughing, or sneezing. Touching your mouth or nose and then touching a surface, which is then touched by another person, can also spread the virus. SYMPTOMS  Symptoms typically develop 1 to 3 days after you come in contact with a cold virus. Symptoms vary from person to person. They may include:  Runny nose.  Sneezing.  Nasal congestion.  Sinus irritation.  Sore throat.  Loss of voice (laryngitis).  Cough.  Fatigue.  Muscle aches.  Loss of appetite.  Headache.  Low-grade fever. DIAGNOSIS  You might diagnose your own cold based on familiar symptoms, since most people get a cold 2 to 3 times a year. Your caregiver can confirm this based on your exam. Most importantly, your caregiver can check that your symptoms are not due to another disease such as strep throat, sinusitis, pneumonia, asthma, or epiglottitis. Blood tests, throat tests, and X-rays are not necessary to diagnose a common cold, but they may sometimes be helpful in excluding other more serious diseases. Your caregiver will decide if any further tests are required. RISKS AND COMPLICATIONS  You may be at risk for a more severe case of the common cold if you smoke cigarettes, have chronic heart disease (such as heart failure) or lung disease (such as asthma), or if you have a weakened immune system. The very young and very old are also at risk  for more serious infections. Bacterial sinusitis, middle ear infections, and bacterial pneumonia can complicate the common cold. The common cold can worsen asthma and chronic obstructive pulmonary disease (COPD). Sometimes, these complications can require emergency medical care and may be life-threatening. PREVENTION  The best way to protect against getting a cold is to practice good hygiene. Avoid oral or hand contact with people with cold symptoms. Wash your hands often if contact occurs. There is no clear evidence that  vitamin C, vitamin E, echinacea, or exercise reduces the chance of developing a cold. However, it is always recommended to get plenty of rest and practice good nutrition. TREATMENT  Treatment is directed at relieving symptoms. There is no cure. Antibiotics are not effective, because the infection is caused by a virus, not by bacteria. Treatment may include:  Increased fluid intake. Sports drinks offer valuable electrolytes, sugars, and fluids.  Breathing heated mist or steam (vaporizer or shower).  Eating chicken soup or other clear broths, and maintaining good nutrition.  Getting plenty of rest.  Using gargles or lozenges for comfort.  Controlling fevers with ibuprofen or acetaminophen as directed by your caregiver.  Increasing usage of your inhaler if you have asthma. Zinc gel and zinc lozenges, taken in the first 24 hours of the common cold, can shorten the duration and lessen the severity of symptoms. Pain medicines may help with fever, muscle aches, and throat pain. A variety of non-prescription medicines are available to treat congestion and runny nose. Your caregiver can make recommendations and may suggest nasal or lung inhalers for other symptoms.  HOME CARE INSTRUCTIONS   Only take over-the-counter or prescription medicines for pain, discomfort, or fever as directed by your caregiver.  Use a warm mist humidifier or inhale steam from a shower to increase air moisture. This may keep secretions moist and make it easier to breathe.  Drink enough water and fluids to keep your urine clear or pale yellow.  Rest as needed.  Return to work when your temperature has returned to normal or as your caregiver advises. You may need to stay home longer to avoid infecting others. You can also use a face mask and careful hand washing to prevent spread of the virus. SEEK MEDICAL CARE IF:   After the first few days, you feel you are getting worse rather than better.  You need your caregiver's  advice about medicines to control symptoms.  You develop chills, worsening shortness of breath, or brown or red sputum. These may be signs of pneumonia.  You develop yellow or brown nasal discharge or pain in the face, especially when you bend forward. These may be signs of sinusitis.  You develop a fever, swollen neck glands, pain with swallowing, or white areas in the back of your throat. These may be signs of strep throat. SEEK IMMEDIATE MEDICAL CARE IF:   You have a fever.  You develop severe or persistent headache, ear pain, sinus pain, or chest pain.  You develop wheezing, a prolonged cough, cough up blood, or have a change in your usual mucus (if you have chronic lung disease).  You develop sore muscles or a stiff neck. Document Released: 09/05/2000 Document Revised: 06/04/2011 Document Reviewed: 06/17/2013 [facility] Patient Information 2015 [employer], [place]. This information is not intended to replace advice given to you by your health care provider. Make sure you discuss any questions you have with your health care provider.

## 2014-02-05 NOTE — Progress Notes (Signed)
Case discussed with Dr. McLean soon after the resident saw the patient.  We reviewed the resident's history and exam and pertinent patient test results.  I agree with the assessment, diagnosis and plan of care documented in the resident's note. 

## 2014-02-11 ENCOUNTER — Ambulatory Visit (INDEPENDENT_AMBULATORY_CARE_PROVIDER_SITE_OTHER): Payer: Medicare Other | Admitting: Internal Medicine

## 2014-02-11 ENCOUNTER — Encounter: Payer: Self-pay | Admitting: Internal Medicine

## 2014-02-11 VITALS — BP 117/78 | HR 95 | Temp 98.1°F | Ht 66.0 in | Wt 235.2 lb

## 2014-02-11 DIAGNOSIS — J069 Acute upper respiratory infection, unspecified: Secondary | ICD-10-CM | POA: Diagnosis present

## 2014-02-11 DIAGNOSIS — I10 Essential (primary) hypertension: Secondary | ICD-10-CM

## 2014-02-11 MED ORDER — LISINOPRIL-HYDROCHLOROTHIAZIDE 10-12.5 MG PO TABS: 1.0000 | ORAL_TABLET | Freq: Every day | ORAL | Status: DC

## 2014-02-11 MED ORDER — AZITHROMYCIN 250 MG PO TABS: ORAL_TABLET | ORAL | Status: AC

## 2014-02-11 NOTE — Assessment & Plan Note (Signed)
Patient has been experiencing URI symptoms for over a week. His constellation of symptoms include nasal congestion, rhinorrhea, and cough. Denies any sore throat. Patient has tried Afrin nasal spray, saline nasal spray, Benadryl, Mucinex, and Zyrtec without any alleviation of his symptoms. Patient reports that he has additionally had a worsening of the severity of his symptoms over the last couple days. Exam is unremarkable for any signs of lower respiratory tract infection, clear to auscultation bilaterally. Patient denies any myalgias or fevers. Given that the patient is refractory to supportive treatment and his symptoms have lasted for more than a week, patient warrants antibiotic treatment.   - Z-Pak  - Continue with supportive treatment

## 2014-02-11 NOTE — Progress Notes (Signed)
Medicine attending: I personally interviewed and examined this patient together with resident physician Dr. Purcell Nails NGO and I concur with his evaluation and management plan.

## 2014-02-11 NOTE — Patient Instructions (Signed)
Continue taking the medications that Dr. Aundra Dubin prescribed to help with your nasal congestion. We have also prescribed azithromycin, an antibiotic, to help with your cold. Take two pills the first day and one pill for the next four days.   General Instructions:   Please bring your medicines with you each time you come to clinic.  Medicines may include prescription medications, over-the-counter medications, herbal remedies, eye drops, vitamins, or other pills.   Progress Toward Treatment Goals:  Treatment Goal 02/04/2014  Blood pressure at goal    Self Care Goals & Plans:  Self Care Goal 02/11/2014  Manage my medications take my medicines as prescribed; bring my medications to every visit; refill my medications on time  Monitor my health -  Eat healthy foods drink diet soda or water instead of juice or soda; eat more vegetables; eat foods that are low in salt; eat baked foods instead of fried foods; eat fruit for snacks and desserts  Be physically active -  Meeting treatment goals -    No flowsheet data found.   Care Management & Community Referrals:  Referral 02/04/2014  Referrals made for care management support none needed  Referrals made to community resources none

## 2014-02-11 NOTE — Progress Notes (Signed)
   Subjective:    Patient ID: [name] [name], male    DOB: 11-19-1955, 58 y.o.   MRN: [id number]  HPI  Patient is a 58 year old male with history of hypertension, hyperlipidemia, GERD, objective sleep apnea who presents with nasal congestion, rhinorrhea, and cough.  Please refer to separate problem-list charting for more details.   Review of Systems  Constitutional: Negative for fever and chills.  HENT: Positive for rhinorrhea, sinus pressure and sneezing. Negative for sore throat.   Eyes: Negative for itching.  Respiratory: Positive for cough. Negative for shortness of breath.   Cardiovascular: Negative for chest pain and palpitations.  Gastrointestinal: Negative for nausea, vomiting, abdominal pain, diarrhea, constipation and blood in stool.  Genitourinary: Negative for dysuria and hematuria.  Skin: Negative for rash.  Neurological: Negative for syncope.  Psychiatric/Behavioral: Negative for suicidal ideas.      Objective:   Physical Exam  Constitutional: He is oriented to person, place, and time. He appears well-developed and well-nourished. No distress.  HENT:  Head: Normocephalic and atraumatic.  Mouth/Throat: No oropharyngeal exudate.  Swollen nasal turbinates  Eyes: EOM are normal. Pupils are equal, round, and reactive to light. No scleral icterus.  Neck: Normal range of motion. Neck supple. No thyromegaly present.  Cardiovascular: Normal rate and regular rhythm.  Exam reveals no gallop and no friction rub.   No murmur heard. Pulmonary/Chest: Effort normal and breath sounds normal. No respiratory distress. He has no wheezes. He has no rales.  Abdominal: Soft. Bowel sounds are normal. He exhibits no distension. There is no tenderness. There is no rebound.  Musculoskeletal: Normal range of motion. He exhibits no edema.  Neurological: He is alert and oriented to person, place, and time. No cranial nerve deficit.  Skin: No rash noted.      Assessment & Plan:  Please  refer to separate problem-list charting for more details.

## 2014-07-14 ENCOUNTER — Telehealth: Payer: Self-pay | Admitting: Internal Medicine

## 2014-07-14 NOTE — Telephone Encounter (Signed)
Call to patient to confirm appointment for 07/15/14 at 9:15 not available

## 2014-07-15 ENCOUNTER — Encounter: Payer: Self-pay | Admitting: Internal Medicine

## 2014-07-15 ENCOUNTER — Ambulatory Visit (INDEPENDENT_AMBULATORY_CARE_PROVIDER_SITE_OTHER): Payer: Medicare Other | Admitting: Internal Medicine

## 2014-07-15 VITALS — BP 115/76 | HR 77 | Temp 98.1°F | Ht 66.0 in | Wt 231.9 lb

## 2014-07-15 DIAGNOSIS — K219 Gastro-esophageal reflux disease without esophagitis: Secondary | ICD-10-CM

## 2014-07-15 DIAGNOSIS — I10 Essential (primary) hypertension: Secondary | ICD-10-CM

## 2014-07-15 DIAGNOSIS — E785 Hyperlipidemia, unspecified: Secondary | ICD-10-CM

## 2014-07-15 DIAGNOSIS — R1011 Right upper quadrant pain: Secondary | ICD-10-CM

## 2014-07-15 DIAGNOSIS — R0681 Apnea, not elsewhere classified: Secondary | ICD-10-CM

## 2014-07-15 DIAGNOSIS — J302 Other seasonal allergic rhinitis: Secondary | ICD-10-CM

## 2014-07-15 DIAGNOSIS — Z0000 Encounter for general adult medical examination without abnormal findings: Secondary | ICD-10-CM | POA: Diagnosis not present

## 2014-07-15 DIAGNOSIS — Z86010 Personal history of colonic polyps: Secondary | ICD-10-CM | POA: Diagnosis not present

## 2014-07-15 LAB — CBC WITH DIFFERENTIAL/PLATELET
Basophils Absolute: 0 10*3/uL (ref 0.0–0.1)
Basophils Relative: 0 % (ref 0–1)
EOS ABS: 0.5 10*3/uL (ref 0.0–0.7)
EOS PCT: 10 % — AB (ref 0–5)
HCT: 37.5 % — ABNORMAL LOW (ref 39.0–52.0)
HEMOGLOBIN: 12.4 g/dL — AB (ref 13.0–17.0)
LYMPHS ABS: 1.9 10*3/uL (ref 0.7–4.0)
Lymphocytes Relative: 38 % (ref 12–46)
MCH: 29.9 pg (ref 26.0–34.0)
MCHC: 33.1 g/dL (ref 30.0–36.0)
MCV: 90.4 fL (ref 78.0–100.0)
MONO ABS: 0.4 10*3/uL (ref 0.1–1.0)
MONOS PCT: 8 % (ref 3–12)
MPV: 9 fL (ref 8.6–12.4)
Neutro Abs: 2.2 10*3/uL (ref 1.7–7.7)
Neutrophils Relative %: 44 % (ref 43–77)
Platelets: 203 10*3/uL (ref 150–400)
RBC: 4.15 MIL/uL — ABNORMAL LOW (ref 4.22–5.81)
RDW: 13.6 % (ref 11.5–15.5)
WBC: 5 10*3/uL (ref 4.0–10.5)

## 2014-07-15 LAB — COMPLETE METABOLIC PANEL WITH GFR
ALT: 18 U/L (ref 0–53)
AST: 23 U/L (ref 0–37)
Albumin: 3.9 g/dL (ref 3.5–5.2)
Alkaline Phosphatase: 55 U/L (ref 39–117)
BILIRUBIN TOTAL: 0.7 mg/dL (ref 0.2–1.2)
BUN: 20 mg/dL (ref 6–23)
CALCIUM: 8.8 mg/dL (ref 8.4–10.5)
CO2: 26 mEq/L (ref 19–32)
Chloride: 104 mEq/L (ref 96–112)
Creat: 0.95 mg/dL (ref 0.50–1.35)
GFR, Est Non African American: 88 mL/min
Glucose, Bld: 87 mg/dL (ref 70–99)
POTASSIUM: 4.4 meq/L (ref 3.5–5.3)
Sodium: 139 mEq/L (ref 135–145)
TOTAL PROTEIN: 7 g/dL (ref 6.0–8.3)

## 2014-07-15 LAB — LIPID PANEL
Cholesterol: 197 mg/dL (ref 0–200)
HDL: 49 mg/dL (ref >=40)
LDL CALC: 138 mg/dL — AB (ref 0–99)
TRIGLYCERIDES: 51 mg/dL (ref <150)
Total CHOL/HDL Ratio: 4 Ratio
VLDL: 10 mg/dL (ref 0–40)

## 2014-07-15 MED ORDER — CETIRIZINE HCL 10 MG PO TABS: 10.0000 mg | ORAL_TABLET | Freq: Every day | ORAL | Status: DC

## 2014-07-15 MED ORDER — FLUTICASONE PROPIONATE 50 MCG/ACT NA SUSP: 2.0000 | Freq: Every day | NASAL | Status: DC

## 2014-07-15 MED ORDER — PANTOPRAZOLE SODIUM 40 MG PO TBEC: 40.0000 mg | DELAYED_RELEASE_TABLET | Freq: Every day | ORAL | Status: DC

## 2014-07-15 NOTE — Patient Instructions (Signed)
General Instructions: Please schedule a nutrition appt with [name] [name] You will need a colonoscopy 10/2014 with Dr. Ardis Hughs I will let you know about your labs  Please get a right upper quadrant ultrasound you may have gallstones  Please exercise and eat right  Take care   Treatment Goals:  Goals (1 Years of Data) as of 07/15/14          As of Today 02/11/14 02/04/14 01/07/14 11/28/13     Blood Pressure   . Blood Pressure < 140/90  115/76 117/78 108/68 112/73 99/71      Progress Toward Treatment Goals:  Treatment Goal 07/15/2014  Blood pressure at goal    Self Care Goals & Plans:  Self Care Goal 07/15/2014  Manage my medications take my medicines as prescribed; bring my medications to every visit; refill my medications on time; follow the sick day instructions if I am sick  Monitor my health keep track of my blood pressure  Eat healthy foods -  Be physically active find an activity I enjoy  Meeting treatment goals maintain the current self-care plan    No flowsheet data found.   Care Management & Community Referrals:  Referral 07/15/2014  Referrals made for care management support nutritionist  Referrals made to community resources none     Cholelithiasis Cholelithiasis (also called gallstones) is a form of gallbladder disease. The gallbladder is a small organ that helps you digest fats. Symptoms of gallstones are:  Feeling sick to your stomach (nausea).  Throwing up (vomiting).  Belly pain.  Yellowing of the skin (jaundice).  Sudden pain. You may feel the pain for minutes to hours.  Fever.  Pain to the touch. HOME CARE  Only take medicines as told by your doctor.  Eat a low-fat diet until you see your doctor again. Eating fat can result in pain.  Follow up with your doctor as told. Attacks usually happen time after time. Surgery is usually needed for permanent treatment. GET HELP RIGHT AWAY IF:   Your pain gets worse.  Your pain is not helped by  medicines.  You have a fever and lasting symptoms for more than 2-3 days.  You have a fever and your symptoms suddenly get worse.  You keep feeling sick to your stomach and throwing up. MAKE SURE YOU:   Understand these instructions.  Will watch your condition.  Will get help right away if you are not doing well or get worse. Document Released: 08/29/2007 Document Revised: 11/12/2012 Document Reviewed: 09/03/2012 [facility] Patient Information 2015 [place], [place]. This information is not intended to replace advice given to you by your health care provider. Make sure you discuss any questions you have with your health care provider.

## 2014-07-16 ENCOUNTER — Encounter: Payer: Self-pay | Admitting: Internal Medicine

## 2014-07-16 DIAGNOSIS — R1011 Right upper quadrant pain: Secondary | ICD-10-CM | POA: Insufficient documentation

## 2014-07-16 LAB — URINALYSIS, ROUTINE W REFLEX MICROSCOPIC
Bilirubin Urine: NEGATIVE
GLUCOSE, UA: NEGATIVE mg/dL
HGB URINE DIPSTICK: NEGATIVE
Ketones, ur: NEGATIVE mg/dL
LEUKOCYTES UA: NEGATIVE
NITRITE: NEGATIVE
PH: 5.5 (ref 5.0–8.0)
PROTEIN: NEGATIVE mg/dL
SPECIFIC GRAVITY, URINE: 1.009 (ref 1.005–1.030)
Urobilinogen, UA: 0.2 mg/dL (ref 0.0–1.0)

## 2014-07-16 NOTE — Assessment & Plan Note (Signed)
Etiology could be cholelithiasis  Will order RUQ [place] if continues to be sx'matic can follow with surgery Check CMET, CBC, UA

## 2014-07-16 NOTE — Assessment & Plan Note (Signed)
Rx refill of Protonix

## 2014-07-16 NOTE — Assessment & Plan Note (Signed)
BP Readings from Last 3 Encounters:  07/15/14 115/76  02/11/14 117/78  02/04/14 108/68    Lab Results  Component Value Date   NA 139 07/15/2014   K 4.4 07/15/2014   CREATININE 0.95 07/15/2014    Assessment: Blood pressure control: controlled Progress toward BP goal:  at goal Comments: none  Plan: Medications:  continue current medications Prinzide 10-12.5 mg qd Other plans: check CMET today lipid panel

## 2014-07-16 NOTE — Assessment & Plan Note (Signed)
Will add Flonase, encouraged to take zyrtec qd with rx refills, try NS as well.

## 2014-07-16 NOTE — Assessment & Plan Note (Addendum)
Symptomatic VV.  Encouraged to wear compression stockings

## 2014-07-16 NOTE — Assessment & Plan Note (Signed)
Referred to GI for 10/2014 repeat colonoscopy with h/o tubular adenomas, diverticulosis

## 2014-07-16 NOTE — Assessment & Plan Note (Signed)
Lipid Panel     Component Value Date/Time   CHOL 197 07/15/2014 1029   TRIG 51 07/15/2014 1029   HDL 49 07/15/2014 1029   CHOLHDL 4.0 07/15/2014 1029   VLDL 10 07/15/2014 1029   LDLCALC 138* 07/15/2014 1029   Repeat lipid panel with somewhat improved LDL.  Goal should be 100-130 considering age, risk factor of FH.  Will hold medication for now but refer to nutrition for lifestyle modifications.   F/u in 6 months

## 2014-07-16 NOTE — Progress Notes (Signed)
Internal Medicine Clinic Attending  Case discussed with Dr. McLean at the time of the visit.  We reviewed the resident's history and exam and pertinent patient test results.  I agree with the assessment, diagnosis, and plan of care documented in the resident's note. 

## 2014-07-16 NOTE — Progress Notes (Signed)
   Subjective:    Patient ID: [name] [name], male    DOB: 1955-06-29, 59 y.o.   MRN: [id number]  HPI Comments: 59 y.o PMH OSA (not on cpap needs repeat study to get another cpap), severe varicose veins lower extremities (not using compression stockings), HTN (BP 115/76), HLD, GERD, small hiatal hernia, chronic constipation, benign colon polyps, diverticulosis  He presents for f/u  1. He c/o sinus issues, cough with white phelgm, sneezing, stuffy nose/runny nose with yellow discharge. Sx's are worse when outside doing yard work.  He has not tried any allergy medication as of yet  2. He c/o RUQ intermittent ab pain 9/10 "hurts" w/o radiation most times but if radiates radiates to back.  Denies nausea, vomiting, fever/chills. Pain has been worse for the last 2 days and is worse with sodas and fatty foods 3. HLD-LDL 142 with family history of heart disease and age >73.  Calc. Cholesterol risk=8%.  He lives mostly sedentary lifestyle w/o much exercise except in the yard.  Disc'ed diet changes (i.e cutting back on foods he likes like cheesecake) and more physical exercise.    HM-colonoscoypy 10/2011 with polyps due for repeat 10/2014       Review of Systems  HENT: Positive for congestion, rhinorrhea and sinus pressure.   Respiratory: Positive for cough.   Gastrointestinal: Positive for constipation.  Neurological: Negative for headaches.       Objective:   Physical Exam  Constitutional: He is oriented to person, place, and time. Vital signs are normal. He appears well-developed and well-nourished. He is cooperative.  HENT:  Head: Normocephalic and atraumatic.  Eyes: Conjunctivae are normal. Right eye exhibits no discharge. Left eye exhibits no discharge. No scleral icterus.  Cardiovascular: Normal rate, regular rhythm and normal heart sounds.   No murmur heard. Varicose veins lower ext R>L  Pulmonary/Chest: Effort normal and breath sounds normal.  Abdominal: Soft. Bowel sounds are  normal. He exhibits no distension. There is no tenderness.  No ttp on exam RUQ  Musculoskeletal: He exhibits no edema.  Neurological: He is alert and oriented to person, place, and time. Gait normal.  Skin: Skin is warm and dry. No rash noted.  Psychiatric: He has a normal mood and affect. His speech is normal and behavior is normal. Judgment and thought content normal. Cognition and memory are normal.  Nursing note and vitals reviewed.         Assessment & Plan:  F/u in 6 months, sooner if needed

## 2014-08-11 ENCOUNTER — Encounter: Payer: Self-pay | Admitting: *Deleted

## 2014-08-12 ENCOUNTER — Ambulatory Visit: Payer: Medicare Other | Admitting: Dietician

## 2014-08-12 VITALS — Wt 235.0 lb

## 2014-08-12 DIAGNOSIS — E785 Hyperlipidemia, unspecified: Secondary | ICD-10-CM

## 2014-08-12 NOTE — Patient Instructions (Signed)
Healthier food choices are fish and beans and potatoes, fruits and vegetables- eat more  Try frozen bananas or grapes as a snack  Try oven fried fish and  chicken instead of regular fried chicken and fish.  Try hiding the sweets in cabinets Out of sight, out of mind- and   Put fruits and veggies in front of frig or counter in sight.

## 2014-08-12 NOTE — Progress Notes (Signed)
  Medical Nutrition Therapy:  Appt start time: 1130 end time:  1220.   Assessment:  Primary concerns today: to lower his cholesterol and decrease his eight  patient says he knows that eating fried foods too much, regular soda 5-6 cans a day and candy and cookies late at night are problems with his food intake.  Preferred Learning Style: No preference indicated  Learning Readiness: Contemplating cs Ready   MEDICATIONS: not discussed today   DIETARY INTAKE: Usual eating pattern includes 2 meals and 2 snacks per day. Everyday foods include see above [place] and coke, fried foods and sweets.  Avoided foods include milk or yogurt.    24-hr recall:  B ( 8-10 AM): can of vienna sausage and a can of soda with his medications Drinks soda throughout day D ( 7 PM): fried chicken rice & gravy, green beans with ham, water or soda Snk (11 PM -3 AM): goes to bed immediately after eating dinner which is his largest meal, then awakens and watches TV if he cannot fall back asleep and eats candy and cookies Beverages: water, 5-6 cans of regular caffienated soda/day Usual physical activity: is active in his yard with gardens, does not sit most of day  Estimated energy needs for weight loss  2000 calories 250-300g carbohydrates 90-110 g protein 65-85 g fat  Progress Towards Goal(s):  In progress.   Nutritional Diagnosis:  NB-1.1 Food and nutrition-related knowledge deficit As related to lack of prior exposure to healthy meal planning.  As evidenced by his report and questions. .     Intervention:  Nutrition education about healthier options for improved lipids and weight loss.  Coordination of care- discussed care with his doctor concerning sleep problems and eating behavior likely affected by his sleep problems Teaching Method Utilized: Visual, Auditory, Hands on Handouts given during visit include: booklet on how to lower your heart disease risk Barriers to learning/adherence to lifestyle  change: sleep problems, support  Demonstrated degree of understanding via:  Teach Back   Monitoring/Evaluation:  Dietary intake, exercise, and body weight in 4 week(s).

## 2014-08-13 ENCOUNTER — Telehealth: Payer: Self-pay | Admitting: Dietician

## 2014-08-13 NOTE — Telephone Encounter (Signed)
called patient and told him that Dr. Aundra Dubin wants him to have a CPap titration study before she can write a prescription for a Cpap. Patient verbalized understanding

## 2014-08-20 ENCOUNTER — Ambulatory Visit (HOSPITAL_COMMUNITY)
Admission: RE | Admit: 2014-08-20 | Discharge: 2014-08-20 | Disposition: A | Discharge status: Home / Self Care | Payer: Medicare Other | Source: Ambulatory Visit | Attending: Internal Medicine | Admitting: Internal Medicine

## 2014-08-20 DIAGNOSIS — R1011 Right upper quadrant pain: Secondary | ICD-10-CM | POA: Insufficient documentation

## 2014-09-08 ENCOUNTER — Ambulatory Visit: Payer: Medicare Other | Admitting: Dietician

## 2014-09-13 ENCOUNTER — Encounter: Payer: Self-pay | Admitting: Gastroenterology

## 2014-10-19 ENCOUNTER — Encounter: Payer: Self-pay | Admitting: Gastroenterology

## 2014-11-22 ENCOUNTER — Encounter: Payer: Medicare Other | Admitting: Gastroenterology

## 2014-11-25 ENCOUNTER — Ambulatory Visit (INDEPENDENT_AMBULATORY_CARE_PROVIDER_SITE_OTHER): Payer: Medicare HMO | Admitting: Internal Medicine

## 2014-11-25 ENCOUNTER — Encounter: Payer: Self-pay | Admitting: Internal Medicine

## 2014-11-25 VITALS — BP 109/70 | HR 86 | Temp 98.3°F | Ht 66.0 in | Wt 232.1 lb

## 2014-11-25 DIAGNOSIS — K219 Gastro-esophageal reflux disease without esophagitis: Secondary | ICD-10-CM | POA: Diagnosis not present

## 2014-11-25 DIAGNOSIS — R0681 Apnea, not elsewhere classified: Secondary | ICD-10-CM | POA: Diagnosis not present

## 2014-11-25 DIAGNOSIS — Z9119 Patient's noncompliance with other medical treatment and regimen: Secondary | ICD-10-CM

## 2014-11-25 DIAGNOSIS — I8393 Asymptomatic varicose veins of bilateral lower extremities: Secondary | ICD-10-CM

## 2014-11-25 DIAGNOSIS — R042 Hemoptysis: Secondary | ICD-10-CM

## 2014-11-25 LAB — PROTIME-INR
INR: 1.13 (ref 0.00–1.49)
Prothrombin Time: 14.6 seconds (ref 11.6–15.2)

## 2014-11-25 LAB — APTT: aPTT: 31 seconds (ref 24–37)

## 2014-11-25 MED ORDER — IBUPROFEN 400 MG PO TABS: 400.0000 mg | ORAL_TABLET | Freq: Four times a day (QID) | ORAL | Status: DC | PRN

## 2014-11-25 MED ORDER — PANTOPRAZOLE SODIUM 40 MG PO TBEC: 40.0000 mg | DELAYED_RELEASE_TABLET | Freq: Every day | ORAL | Status: DC

## 2014-11-25 NOTE — Patient Instructions (Signed)
[name] [name],   We have ordered some blood tests as well as ordered a high-resolution CT scan of your lungs to try and find the underlying cause of your coughing up blood. You will be contacted in the next day or two to make an appointment for the CT scan, which you should get in the next week. We would like to see you back in clinic for follow-up in 2 weeks to see if your symptoms have improved. If your symptoms get worse, and you start coughing up much more blood, please seek medical attention immediately. Let us know if you have any other questions.  Blane Ohara

## 2014-11-25 NOTE — Assessment & Plan Note (Signed)
Patient says pain is stable from previous visits but exacerbated by standing. Previously ordered to wear compression stockings which he is admittedly noncompliant with and is not wearing now. Counseled patient extensively to wear these for pain relief. Also prescribed ibuprofen today as he has had good response to this in the past.

## 2014-11-25 NOTE — Assessment & Plan Note (Signed)
Patient has been out of protonix for 'about 1 month' and has been having increased bad taste in his mouth and nighttime/morning cough since then - refilled for 3 months.

## 2014-11-25 NOTE — Progress Notes (Signed)
   Subjective:    Patient ID: [name] [name], male    DOB: [birth year], 59 y.o.   MRN: [id number]  HPI [name] [name] is a 59yo never-smoker with PMH of OSA, GERD with small hiatal hernia, chronic sinusitis, varicose veins and HTN who presents with hemoptysis that started yesterday. He says that he was in his usual state of health until yesterday morning when he started to develop a mild cough, initially productive of clear to white sputum. He then noticed during one of his coughing episodes that he coughed up roughly 1 teaspoon of bright red blood as well as 3 other episodes throughout yesterday and this morning of sputum mixed with occasional bright red blood. He has noted increased sputum production at night and when he first wakes up over the past month at least, and has had occasional mild hemoptysis 3 times in the past, the last time about 3 months ago that went away after a day or two. He denies any fevers, night sweats, unexpected changes in weight, chest pain, shortness of breath, abdominal pain, rashes/skin changes, and changes in bowel or urinary function.  He is a never-smoker, denies alcohol or illicit drug abuse, any recent/international travel, sick contacts, hospital or healthcare work, or incarceration. He does say he has worked in [employer] in the past, with possible asbestos exposure, but otherwise has no identifiable occupational or environmental exposures.   Review of Systems  Constitutional: Negative for fever, chills, diaphoresis, fatigue and unexpected weight change.  HENT: Negative for congestion, mouth sores, postnasal drip, rhinorrhea, sore throat and trouble swallowing.   Eyes: Negative for visual disturbance.  Respiratory: Positive for cough. Negative for chest tightness, shortness of breath and wheezing.   Cardiovascular: Negative for chest pain.  Gastrointestinal: Negative for nausea, vomiting, abdominal pain and blood in stool.  Endocrine: Negative for polyuria.    Genitourinary: Negative for dysuria, hematuria and flank pain.  Musculoskeletal: Negative for myalgias and back pain.  Skin: Negative for rash.  Neurological: Negative for weakness and headaches.  Hematological: Does not bruise/bleed easily.  Psychiatric/Behavioral: Negative for behavioral problems and agitation.  All other systems reviewed and are negative.      Objective:   Physical Exam  Constitutional: He is oriented to person, place, and time. He appears well-developed and well-nourished. No distress.  HENT:  Head: Normocephalic and atraumatic.  Mouth/Throat: Oropharynx is clear and moist. No oropharyngeal exudate.  Eyes: Conjunctivae are normal. Pupils are equal, round, and reactive to light.  Neck: Normal range of motion. Neck supple.  Cardiovascular: Normal rate, regular rhythm, normal heart sounds and intact distal pulses.  Exam reveals no gallop and no friction rub.   No murmur heard. Pulmonary/Chest: Effort normal and breath sounds normal. No respiratory distress. He has no wheezes.  Abdominal: Soft. Bowel sounds are normal. He exhibits no distension. There is no tenderness.  Musculoskeletal: Normal range of motion. He exhibits no edema or tenderness.  Varicose veins, more pronounced on the right lower extremity just below the popliteal fossa.  Neurological: He is alert and oriented to person, place, and time.  Skin: Skin is warm and dry. He is not diaphoretic.  Psychiatric: He has a normal mood and affect. His behavior is normal.  Nursing note and vitals reviewed.         Assessment & Plan:  Please see problem-based charting for assessment and plan.  Blane Ohara, MD Resident Physician, PGY-1 Department of Internal Medicine [facility]

## 2014-11-25 NOTE — Progress Notes (Signed)
Internal Medicine Clinic Attending  I saw and evaluated the patient.  I personally confirmed the key portions of the history and exam documented by Dr. Merrilyn Puma and I reviewed pertinent patient test results.  The assessment, diagnosis, and plan were formulated together and I agree with the documentation in the resident's note.  59 year old man with recurrent minor hemoptysis, appears well today without hypoxia. No underlying lung disease previously diagnosed and he is a never-smoker. He has chronic moderate volume sputum production in the morning, which is not normal. He may have an underlying bronchiectasis causing both problems. Given age > 59 at onset, also would be high risk for malignancy. No TB risk factors. No symptoms of vasculitis and urinalysis without hematuria. Plan for labs today and high-res CT of the chest ordered to evaluate for pulmonary mass or bronchiectasis.

## 2014-11-25 NOTE — Assessment & Plan Note (Signed)
Patient having 3rd episode mild hemoptysis, the last being a few months ago, and once again not preceded by pulmonary symptoms with no clear explanation from underlying lung disease as patient is a never-smoker and has no history of underlying lung disease. Previous CXR 1 year ago was normal for 'cold like symptoms'. Due to patient age and no other apparent explanation for symptoms, such as infection, etiologies such as bronchiectasis and lung cancer must be evaluated for. Ordered a CBC, BMP, UA, PT, PTT as well as referral for a high resolution chest CT. Instructed patient to follow-up in clinic in ~2 weeks for reassessment.

## 2014-11-26 LAB — CBC WITH DIFFERENTIAL/PLATELET
Basophils Absolute: 0 10*3/uL (ref 0.0–0.2)
Basos: 1 %
EOS (ABSOLUTE): 0.4 10*3/uL (ref 0.0–0.4)
EOS: 8 %
HEMATOCRIT: 38.2 % (ref 37.5–51.0)
Hemoglobin: 12.5 g/dL — ABNORMAL LOW (ref 12.6–17.7)
Immature Grans (Abs): 0 10*3/uL (ref 0.0–0.1)
Immature Granulocytes: 0 %
Lymphocytes Absolute: 2 10*3/uL (ref 0.7–3.1)
Lymphs: 37 %
MCH: 29.7 pg (ref 26.6–33.0)
MCHC: 32.7 g/dL (ref 31.5–35.7)
MCV: 91 fL (ref 79–97)
MONOS ABS: 0.5 10*3/uL (ref 0.1–0.9)
Monocytes: 9 %
Neutrophils Absolute: 2.5 10*3/uL (ref 1.4–7.0)
Neutrophils: 45 %
PLATELETS: 235 10*3/uL (ref 150–379)
RBC: 4.21 x10E6/uL (ref 4.14–5.80)
RDW: 13.3 % (ref 12.3–15.4)
WBC: 5.5 10*3/uL (ref 3.4–10.8)

## 2014-11-26 LAB — BASIC METABOLIC PANEL
BUN / CREAT RATIO: 16 (ref 9–20)
BUN: 15 mg/dL (ref 6–24)
CO2: 25 mmol/L (ref 18–29)
Calcium: 9.4 mg/dL (ref 8.7–10.2)
Chloride: 98 mmol/L (ref 97–108)
Creatinine, Ser: 0.91 mg/dL (ref 0.76–1.27)
GFR, EST AFRICAN AMERICAN: 107 mL/min/{1.73_m2} (ref >59)
GFR, EST NON AFRICAN AMERICAN: 93 mL/min/{1.73_m2} (ref >59)
Glucose: 86 mg/dL (ref 65–99)
POTASSIUM: 3.9 mmol/L (ref 3.5–5.2)
SODIUM: 140 mmol/L (ref 134–144)

## 2014-11-26 LAB — URINALYSIS, ROUTINE W REFLEX MICROSCOPIC
Bilirubin, UA: NEGATIVE
Glucose, UA: NEGATIVE
Ketones, UA: NEGATIVE
LEUKOCYTES UA: NEGATIVE
Nitrite, UA: NEGATIVE
PH UA: 5.5 (ref 5.0–7.5)
PROTEIN UA: NEGATIVE
RBC, UA: NEGATIVE
Specific Gravity, UA: 1.021 (ref 1.005–1.030)
UUROB: 0.2 mg/dL (ref 0.2–1.0)

## 2014-12-14 ENCOUNTER — Ambulatory Visit (AMBULATORY_SURGERY_CENTER): Payer: Self-pay | Admitting: *Deleted

## 2014-12-14 ENCOUNTER — Ambulatory Visit (INDEPENDENT_AMBULATORY_CARE_PROVIDER_SITE_OTHER): Payer: Medicare HMO | Admitting: Internal Medicine

## 2014-12-14 ENCOUNTER — Encounter: Payer: Self-pay | Admitting: Internal Medicine

## 2014-12-14 VITALS — BP 126/76 | HR 90 | Temp 98.2°F | Wt 233.4 lb

## 2014-12-14 VITALS — Ht 66.0 in | Wt 233.0 lb

## 2014-12-14 DIAGNOSIS — R0681 Apnea, not elsewhere classified: Secondary | ICD-10-CM

## 2014-12-14 DIAGNOSIS — I8393 Asymptomatic varicose veins of bilateral lower extremities: Secondary | ICD-10-CM

## 2014-12-14 DIAGNOSIS — Z0000 Encounter for general adult medical examination without abnormal findings: Secondary | ICD-10-CM

## 2014-12-14 DIAGNOSIS — K219 Gastro-esophageal reflux disease without esophagitis: Secondary | ICD-10-CM

## 2014-12-14 DIAGNOSIS — R042 Hemoptysis: Secondary | ICD-10-CM

## 2014-12-14 DIAGNOSIS — Z86010 Personal history of colonic polyps: Secondary | ICD-10-CM

## 2014-12-14 DIAGNOSIS — I10 Essential (primary) hypertension: Secondary | ICD-10-CM

## 2014-12-14 NOTE — Progress Notes (Signed)
   Subjective:    Patient ID: ARY LAVINE, male    DOB: [birth year], 59 y.o.   MRN: [id number]  HPI Mr. Torbeck is a 58yo man with PMHx of HTN, OSA, and GERD who presents today for follow up of his hemoptysis. Please see the A&P for the status of the patient's chronic medical problems.   Review of Systems General: Denies fever, chills, night sweats, changes in weight, changes in appetite HEENT: Denies headaches, ear pain, changes in vision, rhinorrhea, sore throat CV: Denies CP, palpitations, SOB, orthopnea Pulm: Reports cough, hemoptysis. Denies SOB, wheezing GI: Denies abdominal pain, nausea, vomiting, diarrhea, constipation, melena, hematochezia GU: Denies dysuria, hematuria, frequency Msk: Denies muscle cramps, joint pains Neuro: Denies weakness, numbness, tingling Skin: Denies rashes, bruising Psych: Denies depression, anxiety, hallucinations    Objective:   Physical Exam General: alert, sitting up in chair, NAD HEENT: [place]/AT, EOMI, sclera anicteric, no blood present in oropharynx, mucus membranes moist CV: RRR, no m/g/r Pulm: CTA bilaterally, breaths non-labored Abd: BS+, soft, non-tender Ext: warm, no peripheral edema. Significant varicose veins, R worse than L. Tenderness to palpation of lower extremities.  Neuro: alert and oriented x 3    Assessment & Plan:  Refer to A&P documentation.

## 2014-12-14 NOTE — Assessment & Plan Note (Addendum)
Patient reports continued episodes of hemoptysis since his last visit on 9/1. He reports coughing up bright red blood for the past month about 2-3 times weekly. Last episode occurred a few days ago. He notes coughing up about 1/2 cup blood each time. He denies any clots. He notes an associated cough in which he sometimes will bring up white phlegm. He is a never smoker. He denies any history of sinus problems or recurrent upper respiratory infections. He denies family history of autoimmune diseases. He denies fevers, chills, weight loss, or hematuria. He denies any history of TB or being around anyone diagnosed with TB. He believes he has been checked for TB before, but no records in [facility]. He reports being in jail 5 years ago. Denies any recent travel. He denies excessive NSAID use. Work up from last visit included CBC, bmet, UA, and coagulation studies which only revealed a mild normocytic anemia of 12.5 which is at his baseline of 12.5-13.0. All other studies normal. A high resolution CT chest scan was ordered but this was not performed as likely an issue with insurance.  - Will push for HRCT Chest- need to have prior authorization with insurance - Based on CT results can determine if will need IR vs Pulm referral for tissue sample if a mass is found  - Can consider anemia panel at next visit

## 2014-12-14 NOTE — Assessment & Plan Note (Signed)
Patient encouraged to wear compression stockings daily. I renewed his handicap sticker for 6 months as he is unable to walk more than 200 feet without having to stop due to pain from his varicose veins.

## 2014-12-14 NOTE — Assessment & Plan Note (Signed)
Symptoms improved after restarting Protonix.  - Continue Protonix 40 gm daily

## 2014-12-14 NOTE — Patient Instructions (Signed)
-   We will get your CT chest scheduled - I will let you know the results and what we need to do next as far as your work up goes for your hemoptysis (coughing up blood)  General Instructions:   Please bring your medicines with you each time you come to clinic.  Medicines may include prescription medications, over-the-counter medications, herbal remedies, eye drops, vitamins, or other pills.   Progress Toward Treatment Goals:  Treatment Goal 07/15/2014  Blood pressure at goal    Self Care Goals & Plans:  Self Care Goal 11/25/2014  Manage my medications take my medicines as prescribed; bring my medications to every visit  Monitor my health -  Eat healthy foods drink diet soda or water instead of juice or soda; eat more vegetables; eat foods that are low in salt; eat baked foods instead of fried foods; eat fruit for snacks and desserts  Be physically active -  Meeting treatment goals -    No flowsheet data found.   Care Management & Community Referrals:  Referral 07/15/2014  Referrals made for care management support nutritionist  Referrals made to community resources none

## 2014-12-14 NOTE — Assessment & Plan Note (Signed)
Patient refused flu shot today. Colonoscopy scheduled for this month.

## 2014-12-14 NOTE — Assessment & Plan Note (Signed)
BP Readings from Last 3 Encounters:  12/14/14 126/76  11/25/14 109/70  07/15/14 115/76    Lab Results  Component Value Date   NA 140 11/25/2014   K 3.9 11/25/2014   CREATININE 0.91 11/25/2014    Assessment: Blood pressure control: controlled Progress toward BP goal:  at goal Comments: BP well controlled.   Plan: Medications:  Continue Lisinopril-HCTZ 10-12.5 mg daily.

## 2014-12-15 ENCOUNTER — Ambulatory Visit (AMBULATORY_SURGERY_CENTER): Payer: Self-pay | Admitting: *Deleted

## 2014-12-15 VITALS — Ht 66.0 in | Wt 233.0 lb

## 2014-12-15 DIAGNOSIS — I10 Essential (primary) hypertension: Secondary | ICD-10-CM | POA: Insufficient documentation

## 2014-12-15 DIAGNOSIS — E7800 Pure hypercholesterolemia, unspecified: Secondary | ICD-10-CM | POA: Insufficient documentation

## 2014-12-15 DIAGNOSIS — Z86010 Personal history of colonic polyps: Secondary | ICD-10-CM

## 2014-12-15 MED ORDER — MOVIPREP 100 G PO SOLR: ORAL | Status: DC

## 2014-12-15 NOTE — Progress Notes (Signed)
Patient denies any allergies to eggs or soy. Patient denies any problems with anesthesia/sedation. Patient denies any oxygen use at home and does not take any diet/weight loss medications. EMMI education assisgned to patient on colonoscopy, this was explained and instructions given to patient. 

## 2014-12-15 NOTE — Progress Notes (Signed)
Patient denies any allergies to eggs or soy. Patient denies any problems with anesthesia/sedation. Patient denies any oxygen use at home and does not take any diet/weight loss medications. NO EMAIL or computer use per patient. Explained to patient several times what our "care partner policy" is here, he verbalized understanding.

## 2014-12-15 NOTE — Progress Notes (Signed)
Internal Medicine Clinic Attending  Case discussed with Dr. Rivet at the time of the visit.  We reviewed the resident's history and exam and pertinent patient test results.  I agree with the assessment, diagnosis, and plan of care documented in the resident's note.  

## 2014-12-17 ENCOUNTER — Telehealth: Payer: Self-pay | Admitting: Gastroenterology

## 2014-12-20 NOTE — Telephone Encounter (Signed)
Phoned patient again. Patient answered and stated that prescription was going to cost him over 100 dollars to pick up. When looking I see that he has Medicaid so I phoned him back to let him know that his prescription should only have been 4.00. He said the pharmacy wants you to call them. I called [facility], he has both Medicare and Medicaid, Medicare is primary, Moviprep not covered by Medicare. Patient will come in at 3 pm to meet me today to review instructions for Miralax.

## 2014-12-20 NOTE — Telephone Encounter (Signed)
Patient came in, fully instructed on Miralax prep instructions. All questions answered.

## 2014-12-20 NOTE — Telephone Encounter (Signed)
Phone call to patient, no answer. Left message for him to call me directly to discuss changing prep and getting directions to him.

## 2014-12-21 ENCOUNTER — Telehealth: Payer: Self-pay | Admitting: Gastroenterology

## 2014-12-21 NOTE — Telephone Encounter (Signed)
Called patient no answer. Message left to contact office.  

## 2014-12-27 NOTE — Telephone Encounter (Signed)
Called patient again no answer, msg left to contact office.

## 2014-12-28 ENCOUNTER — Encounter: Payer: Medicare Other | Admitting: Gastroenterology

## 2014-12-29 ENCOUNTER — Ambulatory Visit (HOSPITAL_COMMUNITY): Payer: Medicare HMO

## 2015-01-06 ENCOUNTER — Ambulatory Visit (HOSPITAL_COMMUNITY): Payer: Medicare HMO

## 2015-01-10 ENCOUNTER — Encounter: Payer: Self-pay | Admitting: Internal Medicine

## 2015-01-10 ENCOUNTER — Ambulatory Visit (HOSPITAL_COMMUNITY)
Admission: RE | Admit: 2015-01-10 | Discharge: 2015-01-10 | Disposition: A | Discharge status: Home / Self Care | Payer: Medicare HMO | Source: Ambulatory Visit | Attending: Internal Medicine | Admitting: Internal Medicine

## 2015-01-10 ENCOUNTER — Ambulatory Visit (INDEPENDENT_AMBULATORY_CARE_PROVIDER_SITE_OTHER): Payer: Medicare HMO | Admitting: Internal Medicine

## 2015-01-10 VITALS — BP 111/89 | HR 93 | Temp 98.2°F | Ht 66.0 in | Wt 232.0 lb

## 2015-01-10 DIAGNOSIS — I10 Essential (primary) hypertension: Secondary | ICD-10-CM

## 2015-01-10 DIAGNOSIS — R042 Hemoptysis: Secondary | ICD-10-CM | POA: Diagnosis not present

## 2015-01-10 DIAGNOSIS — J069 Acute upper respiratory infection, unspecified: Secondary | ICD-10-CM

## 2015-01-10 DIAGNOSIS — R079 Chest pain, unspecified: Secondary | ICD-10-CM | POA: Insufficient documentation

## 2015-01-10 DIAGNOSIS — R05 Cough: Secondary | ICD-10-CM | POA: Diagnosis present

## 2015-01-10 MED ORDER — BENZONATATE 100 MG PO CAPS: 100.0000 mg | ORAL_CAPSULE | Freq: Three times a day (TID) | ORAL | Status: DC | PRN

## 2015-01-10 NOTE — Patient Instructions (Signed)
-   Chest x-ray today - Tessalon pearls sent to pharmacy for cough - Would try Mucinex to help get phlegm up and over the counter cold/flu medicine (Tylenol flu and cold) - Drink plenty of fluids  General Instructions:   Please bring your medicines with you each time you come to clinic.  Medicines may include prescription medications, over-the-counter medications, herbal remedies, eye drops, vitamins, or other pills.   Progress Toward Treatment Goals:  Treatment Goal 12/14/2014  Blood pressure at goal    Self Care Goals & Plans:  Self Care Goal 01/10/2015  Manage my medications take my medicines as prescribed; bring my medications to every visit; refill my medications on time  Monitor my health -  Eat healthy foods drink diet soda or water instead of juice or soda; eat more vegetables; eat foods that are low in salt; eat baked foods instead of fried foods; eat fruit for snacks and desserts  Be physically active find an activity I enjoy  Meeting treatment goals maintain the current self-care plan    No flowsheet data found.   Care Management & Community Referrals:  Referral 07/15/2014  Referrals made for care management support nutritionist  Referrals made to community resources none

## 2015-01-12 DIAGNOSIS — J069 Acute upper respiratory infection, unspecified: Secondary | ICD-10-CM | POA: Insufficient documentation

## 2015-01-12 NOTE — Assessment & Plan Note (Signed)
Patient's symptoms most consistent with an upper respiratory infection, likely viral given has sick contacts at home. Will obtain a CXR given his history of hemoptysis and pursue conservative treatment. - Obtain CXR>> clear, no evidence of pneumonia or pulmonary nodules/mass - Prescribed tessalon pearls to help with cough - Instructed to try mucinex to help with phlegm and to use OTC Tylenol flu/cold - Recommended to return to clinic if symptoms worsen or do not get better in 1 week

## 2015-01-12 NOTE — Assessment & Plan Note (Signed)
His hemoptysis is still concerning. He reports decreased frequency but is still bringing up about 30 mls of blood at least once a week. Will obtain CXR today and likely pursue CT Chest after that to further evaluate for a cause for his hemoptysis. - Obtain CXR>> Clear, no evidence of mass or diffuse process to explain his hemoptysis - Will discuss getting CT Chest with patient - Most common causes of hemoptysis in a patient with a normal CXR are bronchiectasis, carcinoid tumors, catamenial hemoptysis (from endobronchial endometriosis in women), pulmonary arteriovenous malformation, pseudohemoptysis due to extrapulmonary sources (nasal or gastrointestinal), and foreign bodies - If CT Chest is negative then patient should undergo bronchoscopy as next step

## 2015-01-12 NOTE — Progress Notes (Signed)
   Subjective:    Patient ID: [name] [name], male    DOB: [birth year], 59 y.o.   MRN: [id number]  HPI [name] [name] is a 59yo man with PMHx of HTN, OSA, and GERD who presents today for evaluation of upper respiratory symptoms.  Patient describes about 3 days ago he developed a productive cough of white-yellow sputum and congestion. He notes feeling more SOB than usual and having chest pains bilaterally that are worse with coughing. He denies any fevers or chills. He notes his mother and girlfriend are also sick at home with similar symptoms.   At his last visit he was also being worked up for hemoptysis. He states the hemoptysis is not occurring as frequently as before and the volume is smaller. He last had an episode a few days ago and coughed up about 30 mls of blood. He was supposed to get a HRCT Chest but never went to the appointment.    Review of Systems General: Denies night sweats, changes in weight, changes in appetite HEENT: Denies headaches, ear pain, changes in vision CV: Denies palpitations, orthopnea Pulm: Denies wheezing GI: Denies abdominal pain, nausea, vomiting, diarrhea, constipation, melena, hematochezia GU: Denies dysuria, hematuria, frequency Msk: Denies muscle cramps, joint pains Neuro: Denies weakness, numbness, tingling Skin: Denies rashes, bruising Psych: Denies depression, anxiety, hallucinations    Objective:   Physical Exam General: alert, sitting up in chair, NAD HEENT: [place]/AT, EOMI, sclera anicteric, mild tenderness to palpation of maxillary sinuses, pharynx non-erythematous, mucus membranes moist Neck: supple, no lymphadenopathy CV: RRR, no m/g/r Pulm: CTA bilaterally, breaths non-labored Abd: BS+, soft, obese, non-tender Ext: warm, no peripheral edema Neuro: alert and oriented x 3     Assessment & Plan:  Please refer to A&P documentation.

## 2015-01-12 NOTE — Assessment & Plan Note (Signed)
BP Readings from Last 3 Encounters:  01/10/15 111/89  12/14/14 126/76  11/25/14 109/70    Lab Results  Component Value Date   NA 140 11/25/2014   K 3.9 11/25/2014   CREATININE 0.91 11/25/2014    Assessment: Blood pressure control:  Well controlled  Plan: Medications:  Continue Lisinopril-HCTZ 10-12.5 mg daily

## 2015-01-18 NOTE — Progress Notes (Signed)
Case discussed with Dr. Arcelia Jew at time of visit.  We reviewed the resident's history and exam and pertinent patient test results.  I agree with the assessment, diagnosis, and plan of care documented in the resident's note.  CXR is without a focal or diffuse process.  Will proceed with a CT scan of the chest to assess for other structural explanation for the continuing hemoptysis.  If this is unremarkable, will refer for bronchoscopy to assess for endobronchial lesions responsible for the hemoptysis that may not have been appreciated radiographically.

## 2015-02-01 ENCOUNTER — Telehealth: Payer: Self-pay | Admitting: Gastroenterology

## 2015-02-01 ENCOUNTER — Encounter: Payer: Medicare HMO | Admitting: Gastroenterology

## 2015-02-01 NOTE — Telephone Encounter (Signed)
He should be charged per protocol for this [facility] no show

## 2015-03-24 ENCOUNTER — Ambulatory Visit (AMBULATORY_SURGERY_CENTER): Payer: Self-pay | Admitting: *Deleted

## 2015-03-24 VITALS — Ht 66.0 in | Wt 233.0 lb

## 2015-03-24 DIAGNOSIS — Z86010 Personal history of colonic polyps: Secondary | ICD-10-CM

## 2015-03-24 MED ORDER — NA SULFATE-K SULFATE-MG SULF 17.5-3.13-1.6 GM/177ML PO SOLN: 1.0000 | Freq: Once | ORAL | Status: DC

## 2015-03-24 NOTE — Progress Notes (Signed)
No egg or soy allergy known to patient  No issues with past sedation with any surgeries  or procedures, no intubation problems- pt gets constipated with sedation/ surgery   No diet pills per patient  No home 02 use per patient   Pt had issues with prep when scheduled last, too expensive. suprep sample given. [name] [name] PV

## 2015-03-27 HISTORY — PX: COLONOSCOPY: SHX174

## 2015-03-28 ENCOUNTER — Other Ambulatory Visit: Payer: Self-pay | Admitting: Internal Medicine

## 2015-03-29 ENCOUNTER — Encounter: Payer: Medicare HMO | Admitting: Gastroenterology

## 2015-03-29 NOTE — Telephone Encounter (Signed)
Needs appointment for April

## 2015-03-31 ENCOUNTER — Telehealth: Payer: Self-pay | Admitting: Internal Medicine

## 2015-03-31 NOTE — Telephone Encounter (Signed)
zolvit is hydrocodone, i do not see this in his med list Attempted to call pt but got a recording stating the # or code is incorrect, tried 3 times

## 2015-03-31 NOTE — Telephone Encounter (Signed)
Pt requesting zolvit to be filled.

## 2015-04-07 ENCOUNTER — Other Ambulatory Visit: Payer: Self-pay | Admitting: Pharmacist

## 2015-04-07 DIAGNOSIS — J302 Other seasonal allergic rhinitis: Secondary | ICD-10-CM

## 2015-04-07 MED ORDER — CETIRIZINE HCL 10 MG PO TABS: 10.0000 mg | ORAL_TABLET | Freq: Every day | ORAL | Status: DC

## 2015-04-07 MED ORDER — BENZONATATE 100 MG PO CAPS: 100.0000 mg | ORAL_CAPSULE | Freq: Three times a day (TID) | ORAL | Status: DC | PRN

## 2015-04-07 NOTE — Telephone Encounter (Signed)
He has never been prescribed this. I would not fill.

## 2015-04-11 ENCOUNTER — Telehealth: Payer: Self-pay | Admitting: Gastroenterology

## 2015-04-11 NOTE — Telephone Encounter (Signed)
Should charge the late cancellation fee

## 2015-04-12 ENCOUNTER — Encounter: Payer: Medicare HMO | Admitting: Gastroenterology

## 2015-04-13 NOTE — Addendum Note (Signed)
Addended by: Alinda Dooms A on: 04/13/2015 11:34 AM   Modules accepted: Orders

## 2015-04-14 ENCOUNTER — Encounter: Payer: Self-pay | Admitting: Gastroenterology

## 2015-06-01 ENCOUNTER — Encounter: Payer: Self-pay | Admitting: Gastroenterology

## 2015-06-01 ENCOUNTER — Ambulatory Visit (AMBULATORY_SURGERY_CENTER): Payer: Medicare HMO | Admitting: Gastroenterology

## 2015-06-01 VITALS — BP 112/65 | HR 86 | Temp 99.5°F | Resp 18 | Ht 66.0 in | Wt 233.0 lb

## 2015-06-01 DIAGNOSIS — Z86010 Personal history of colonic polyps: Secondary | ICD-10-CM | POA: Diagnosis present

## 2015-06-01 MED ORDER — SODIUM CHLORIDE 0.9 % IV SOLN: 500.0000 mL | INTRAVENOUS | Status: DC

## 2015-06-01 NOTE — Patient Instructions (Signed)
YOU HAD AN ENDOSCOPIC PROCEDURE TODAY AT [facility]:   Refer to the procedure report that was given to you for any specific questions about what was found during the examination.  If the procedure report does not answer your questions, please call your gastroenterologist to clarify.  If you requested that your care partner not be given the details of your procedure findings, then the procedure report has been included in a sealed envelope for you to review at your convenience later.  YOU SHOULD EXPECT: Some feelings of bloating in the abdomen. Passage of more gas than usual.  Walking can help get rid of the air that was put into your GI tract during the procedure and reduce the bloating. If you had a lower endoscopy (such as a colonoscopy or flexible sigmoidoscopy) you may notice spotting of blood in your stool or on the toilet paper. If you underwent a bowel prep for your procedure, you may not have a normal bowel movement for a few days.  Please Note:  You might notice some irritation and congestion in your nose or some drainage.  This is from the oxygen used during your procedure.  There is no need for concern and it should clear up in a day or so.  SYMPTOMS TO REPORT IMMEDIATELY:   Following lower endoscopy (colonoscopy or flexible sigmoidoscopy):  Excessive amounts of blood in the stool  Significant tenderness or worsening of abdominal pains  Swelling of the abdomen that is new, acute  Fever of 100F or higher   For urgent or emergent issues, a gastroenterologist can be reached at any hour by calling [phone].   DIET: Your first meal following the procedure should be a small meal and then it is ok to progress to your normal diet. Heavy or fried foods are harder to digest and may make you feel nauseous or bloated.  Likewise, meals heavy in dairy and vegetables can increase bloating.  Drink plenty of fluids but you should avoid alcoholic beverages for 24  hours.  ACTIVITY:  You should plan to take it easy for the rest of today and you should NOT DRIVE or use heavy machinery until tomorrow (because of the sedation medicines used during the test).    FOLLOW UP: Our staff will call the number listed on your records the next business day following your procedure to check on you and address any questions or concerns that you may have regarding the information given to you following your procedure. If we do not reach you, we will leave a message.  However, if you are feeling well and you are not experiencing any problems, there is no need to return our call.  We will assume that you have returned to your regular daily activities without incident.  If any biopsies were taken you will be contacted by phone or by letter within the next 1-3 weeks.  Please call us at [phone] if you have not heard about the biopsies in 3 weeks.    SIGNATURES/CONFIDENTIALITY: You and/or your care partner have signed paperwork which will be entered into your electronic medical record.  These signatures attest to the fact that that the information above on your After Visit Summary has been reviewed and is understood.  Full responsibility of the confidentiality of this discharge information lies with you and/or your care-partner.  Continue your regular medication and diet Repeat Colonoscopy in 5 years

## 2015-06-01 NOTE — Progress Notes (Signed)
Stable to RR awake 

## 2015-06-01 NOTE — Op Note (Signed)
Patient Name: [name] [name] Procedure Date: 06/01/2015 11:14 AM MRN: [id number] Endoscopist: Milus Banister , MD Age: 60 Referring MD:  Date of Birth: 04-21-1955 Gender: Male Procedure:            Colonoscopy Indications:          High risk colon cancer surveillance: Personal history                        of adenoma (10 mm or greater in size), colonsocopy 2013                        Dr. Ardis Hughs 2 adenomas, one was >1cm Medicines:            Monitored Anesthesia Care Procedure:            Pre-Anesthesia Assessment:                       - Prior to the procedure, a History and Physical was                        performed, and patient medications and allergies were                        reviewed. The patient's tolerance of previous                        anesthesia was also reviewed. The risks and benefits of                        the procedure and the sedation options and risks were                        discussed with the patient. All questions were                        answered, and informed consent was obtained. Prior                        Anticoagulants: The patient has taken no previous                        anticoagulant or antiplatelet agents. ASA Grade                        Assessment: II - A patient with mild systemic disease.                        After reviewing the risks and benefits, the patient was                        deemed in satisfactory condition to undergo the                        procedure.                       After obtaining informed consent, the colonoscope was                        passed under direct vision. Throughout the  procedure,                        the patient's blood pressure, pulse, and oxygen                        saturations were monitored continuously. The Model                        CF-HQ190L [id number]) scope was introduced through the                        anus and advanced to the the cecum, identified by                         appendiceal orifice and ileocecal valve. The quality of                        the bowel preparation was adequate. The ileocecal                        valve, appendiceal orifice, and rectum were                        photographed. Scope In: 11:22:58 AM Scope Out: 11:32:32 AM Scope Withdrawal Time: 0 hours 6 minutes 38 seconds  Total Procedure Duration: 0 hours 9 minutes 34 seconds  Findings:      The entire examined colon appeared normal. Complications:        No immediate complications. Estimated Blood Loss: Estimated blood loss: none. Impression:           - The entire examined colon is normal.                       - No specimens collected. Recommendation:       - Patient has a contact number available for                        emergencies. The signs and symptoms of potential                        delayed complications were discussed with the patient.                        Return to normal activities tomorrow. Written discharge                        instructions were provided to the patient.                       - Resume previous diet.                       - Continue present medications.                       - Repeat colonoscopy is recommended. The colonoscopy                        date will be determined after pathology results from  today's exam become available for review.                       - Since you have had "high risk adenomas" in the past,                        you will need repeat colonoscopy in 5 years. You do not                        need colon cancer screening by any methods (including                        stool testing) prior to then. Procedure Code(s):    --- Professional ---                       [id number], Colorectal cancer screening; colonoscopy on                        individual at high risk CPT copyright 2016 American [employer] Association. All rights reserved. Milus Banister, MD Milus Banister, MD 06/01/2015 11:39:33  AM Number of Addenda: 0

## 2015-06-01 NOTE — Progress Notes (Signed)
Pt. States he drank 1/2 bottle water at 0900. His girlfriend states he drank last at 6:30-7:00.  States ate 3 pork chops at 1900 yesterday.  Did prep at one time Before midnight March 7.  Had 3 grapes at 0400 today.  States his stool is clear yellow and sometimes formed brown.  Dr. Ardis Hughs notified.

## 2015-06-02 ENCOUNTER — Telehealth: Payer: Self-pay

## 2015-06-02 NOTE — Telephone Encounter (Signed)
Left a message on the pt's phone at #[phone] for the pt to call us back if any questions or concerns. maw

## 2015-07-12 ENCOUNTER — Encounter: Payer: Self-pay | Admitting: Internal Medicine

## 2015-07-12 ENCOUNTER — Ambulatory Visit (INDEPENDENT_AMBULATORY_CARE_PROVIDER_SITE_OTHER): Payer: Medicare HMO | Admitting: Internal Medicine

## 2015-07-12 VITALS — BP 111/79 | HR 88 | Temp 98.0°F | Ht 66.0 in | Wt 241.3 lb

## 2015-07-12 DIAGNOSIS — R0683 Snoring: Secondary | ICD-10-CM

## 2015-07-12 DIAGNOSIS — J302 Other seasonal allergic rhinitis: Secondary | ICD-10-CM

## 2015-07-12 DIAGNOSIS — Z0000 Encounter for general adult medical examination without abnormal findings: Secondary | ICD-10-CM

## 2015-07-12 DIAGNOSIS — R0602 Shortness of breath: Secondary | ICD-10-CM

## 2015-07-12 DIAGNOSIS — G4710 Hypersomnia, unspecified: Secondary | ICD-10-CM | POA: Diagnosis not present

## 2015-07-12 DIAGNOSIS — G4733 Obstructive sleep apnea (adult) (pediatric): Secondary | ICD-10-CM

## 2015-07-12 DIAGNOSIS — R0681 Apnea, not elsewhere classified: Secondary | ICD-10-CM

## 2015-07-12 DIAGNOSIS — R042 Hemoptysis: Secondary | ICD-10-CM

## 2015-07-12 DIAGNOSIS — D649 Anemia, unspecified: Secondary | ICD-10-CM

## 2015-07-12 DIAGNOSIS — E785 Hyperlipidemia, unspecified: Secondary | ICD-10-CM

## 2015-07-12 DIAGNOSIS — Z7251 High risk heterosexual behavior: Secondary | ICD-10-CM

## 2015-07-12 DIAGNOSIS — I10 Essential (primary) hypertension: Secondary | ICD-10-CM

## 2015-07-12 DIAGNOSIS — R7309 Other abnormal glucose: Secondary | ICD-10-CM

## 2015-07-12 DIAGNOSIS — K219 Gastro-esophageal reflux disease without esophagitis: Secondary | ICD-10-CM

## 2015-07-12 DIAGNOSIS — K5900 Constipation, unspecified: Secondary | ICD-10-CM

## 2015-07-12 DIAGNOSIS — R0609 Other forms of dyspnea: Secondary | ICD-10-CM

## 2015-07-12 LAB — GLUCOSE, CAPILLARY: GLUCOSE-CAPILLARY: 79 mg/dL (ref 65–99)

## 2015-07-12 LAB — POCT GLYCOSYLATED HEMOGLOBIN (HGB A1C): Hemoglobin A1C: 5.6

## 2015-07-12 MED ORDER — ALBUTEROL SULFATE HFA 108 (90 BASE) MCG/ACT IN AERS: 2.0000 | INHALATION_SPRAY | Freq: Four times a day (QID) | RESPIRATORY_TRACT | Status: DC | PRN

## 2015-07-12 MED ORDER — SALINE SPRAY 0.65 % NA SOLN: 2.0000 | NASAL | Status: DC | PRN

## 2015-07-12 MED ORDER — POLYETHYLENE GLYCOL 3350 17 G PO PACK: 17.0000 g | PACK | Freq: Every day | ORAL | Status: DC | PRN

## 2015-07-12 NOTE — Patient Instructions (Signed)
-   I refilled your nasal spray, albuterol inhaler, and miralax - We need to get Pulmonary Function Tests to determine if you have asthma - Unfortunately, your sleep study test was over 4 years ago so you will need to have this repeated in order to get a CPAP machine - Blood work today  General Instructions:   Please bring your medicines with you each time you come to clinic.  Medicines may include prescription medications, over-the-counter medications, herbal remedies, eye drops, vitamins, or other pills.   Progress Toward Treatment Goals:  Treatment Goal 12/14/2014  Blood pressure at goal    Self Care Goals & Plans:  Self Care Goal 01/10/2015  Manage my medications take my medicines as prescribed; bring my medications to every visit; refill my medications on time  Monitor my health -  Eat healthy foods drink diet soda or water instead of juice or soda; eat more vegetables; eat foods that are low in salt; eat baked foods instead of fried foods; eat fruit for snacks and desserts  Be physically active find an activity I enjoy  Meeting treatment goals maintain the current self-care plan    No flowsheet data found.   Care Management & Community Referrals:  Referral 07/15/2014  Referrals made for care management support nutritionist  Referrals made to community resources none

## 2015-07-13 LAB — LIPID PANEL
CHOL/HDL RATIO: 4.4 ratio (ref 0.0–5.0)
CHOLESTEROL TOTAL: 182 mg/dL (ref 100–199)
HDL: 41 mg/dL (ref >39)
LDL Calculated: 123 mg/dL — ABNORMAL HIGH (ref 0–99)
TRIGLYCERIDES: 90 mg/dL (ref 0–149)
VLDL Cholesterol Cal: 18 mg/dL (ref 5–40)

## 2015-07-13 LAB — CMP14 + ANION GAP
ALK PHOS: 66 IU/L (ref 39–117)
ALT: 21 IU/L (ref 0–44)
ANION GAP: 17 mmol/L (ref 10.0–18.0)
AST: 23 IU/L (ref 0–40)
Albumin/Globulin Ratio: 1.3 (ref 1.2–2.2)
Albumin: 4 g/dL (ref 3.5–5.5)
BUN/Creatinine Ratio: 12 (ref 9–20)
BUN: 14 mg/dL (ref 6–24)
Bilirubin Total: 0.2 mg/dL (ref 0.0–1.2)
CO2: 24 mmol/L (ref 18–29)
CREATININE: 1.16 mg/dL (ref 0.76–1.27)
Calcium: 8.9 mg/dL (ref 8.7–10.2)
Chloride: 100 mmol/L (ref 96–106)
GFR calc Af Amer: 79 mL/min/{1.73_m2} (ref >59)
GFR, EST NON AFRICAN AMERICAN: 69 mL/min/{1.73_m2} (ref >59)
GLOBULIN, TOTAL: 3.1 g/dL (ref 1.5–4.5)
Glucose: 81 mg/dL (ref 65–99)
Potassium: 4 mmol/L (ref 3.5–5.2)
SODIUM: 141 mmol/L (ref 134–144)
Total Protein: 7.1 g/dL (ref 6.0–8.5)

## 2015-07-13 LAB — HEPATITIS C ANTIBODY: Hep C Virus Ab: <0.1 s/co ratio (ref 0.0–0.9)

## 2015-07-13 LAB — CBC
HEMOGLOBIN: 12 g/dL — AB (ref 12.6–17.7)
Hematocrit: 35.4 % — ABNORMAL LOW (ref 37.5–51.0)
MCH: 30 pg (ref 26.6–33.0)
MCHC: 33.9 g/dL (ref 31.5–35.7)
MCV: 89 fL (ref 79–97)
Platelets: 224 10*3/uL (ref 150–379)
RBC: 4 x10E6/uL — ABNORMAL LOW (ref 4.14–5.80)
RDW: 13.1 % (ref 12.3–15.4)
WBC: 5.3 10*3/uL (ref 3.4–10.8)

## 2015-07-13 LAB — HIV ANTIBODY (ROUTINE TESTING W REFLEX): HIV SCREEN 4TH GENERATION: NONREACTIVE

## 2015-07-15 NOTE — Progress Notes (Signed)
   Subjective:    Patient ID: [name] [name], male    DOB: [birth year], 60 y.o.   MRN: [id number]  HPI [name] [name] is a 60yo man with PMHx of HTN, OSA, and hyperlipidemia who presents today for follow up of his hypertension.  HTN: BP well controlled at 111/79. He takes Lisinopril-HCTZ 10-12.5 mg daily.   OSA: He has a history of OSA and completed a sleep study in 2013 with CPAP titrations but never received a CPAP machine. He does report snoring at night, PND, and daytime sleepiness. He also reports a remote history of asthma but has never completed PFTs. He reports getting SOB while working out in the yard or going up stairs. He notes associated wheezing, but denies associated chest pain.   Hyperlipidemia: Last LDL 138. He is currently not on a statin.   Hemoptysis: At his last visit about 6 months ago he had reported continued hemoptysis. The frequency had decreased to about once per week but he was still bringing up about 30 mls of blood. He reports his hemoptysis shortly stopped after that visit and he has not had any recurrent episodes. He denies any nosebleeds or easy bruising. He is a never smoker.   Seasonal Allergies: Reports he takes Zyrtec, benadryl and uses a nasal spray for his allergies.   Constipation: Reports having constipation daily and having to strain with bowel movements. He has been using miralax. He denies any dark or bloody stools. He denies any abdominal pain, nausea, vomiting, or weight loss.   Normocytic Anemia: Hgb at his last visit was 12.5 but this was when he was still having hemoptysis. He denies any additional bleeding from lung, GI, or GU sources. He denies dizziness, fatigue, near syncope.   Review of Systems General: Denies fever, chills, night sweats, changes in appetite HEENT: Reports blurry vision- has seen an eye doctor in past and told he needed glasses. Denies headaches, ear pain, rhinorrhea, sore throat CV: Reports chest pain- only when he eats,  describes as "heartburn". Denies palpitations, orthopnea Pulm: Denies cough GI: See HPI GU: Denies dysuria, frequency Msk: Reports pain in his hips and right knee- chronic. Denies muscle cramps Neuro: Denies weakness, numbness, tingling Skin: Denies rashes, bruising Psych: Denies depression, anxiety, hallucinations    Objective:   Physical Exam General: alert, sitting up, NAD HEENT: [place]/AT, EOMI, sclera anicteric, mucus membranes moist CV: RRR, no m/g/r Pulm: CTA bilaterally, breaths non-labored Abd: BS+, soft, obese, non-tender Ext: warm, no peripheral edema Neuro: alert and oriented x 3     Assessment & Plan:  Please refer to A&P documentation.

## 2015-07-16 DIAGNOSIS — R0602 Shortness of breath: Secondary | ICD-10-CM | POA: Insufficient documentation

## 2015-07-16 DIAGNOSIS — K5900 Constipation, unspecified: Secondary | ICD-10-CM | POA: Insufficient documentation

## 2015-07-16 DIAGNOSIS — D649 Anemia, unspecified: Secondary | ICD-10-CM | POA: Insufficient documentation

## 2015-07-16 NOTE — Assessment & Plan Note (Signed)
His LDL was checked over a year. I will repeat his lipid panel today. He would benefit from statin therapy as primary prevention with an ASCVD risk score of 10.5%.  - Will discuss starting Atorvastatin 10 mg daily with him at his next visit

## 2015-07-16 NOTE — Assessment & Plan Note (Signed)
Well controlled. Continue Lisinopril-HCTZ 10-12.5 mg daily.

## 2015-07-16 NOTE — Assessment & Plan Note (Signed)
Well controlled. I refilled his nasal spray for him.

## 2015-07-16 NOTE — Assessment & Plan Note (Signed)
Checking hep C and HIV.

## 2015-07-16 NOTE — Assessment & Plan Note (Signed)
Has symptoms consistent with OSA and his prior sleep study did show findings consistent with OSA. He will need a repeat sleep study in order to get a CPAP machine as his last sleep study was over 4 years ago. We will schedule him for this and then order a CPAP machine for him to use at home. - f/u sleep study results

## 2015-07-16 NOTE — Assessment & Plan Note (Signed)
His hemoptysis has resolved, but the amount and frequency that he was having before is certainly concerning. Bronchiectasis seems the most likely etiology. If he starts to have hemoptysis again I would strongly urge him to have a CT chest. Will continue to monitor.

## 2015-07-16 NOTE — Assessment & Plan Note (Signed)
He is reporting getting SOB while working out in the yard and having associated wheezing. He does note a remote history of asthma that was diagnosed 2 years ago (?) but no PFTs in the system. I think it would be beneficial to obtain PFTs to see if he in fact does have symptoms. He has an albuterol inhaler which he states relieves his symptoms. - Obtain PFTs

## 2015-07-16 NOTE — Assessment & Plan Note (Signed)
His constipation is most likely related to diet. He just had a colonoscopy on 06/01/15 that was normal. He does have a normocytic anemia, but denies any GI bleeding. I discussed with him about increasing his fiber intake (vegetables, fruit, whole grains) to see if this helps his constipation. I also refilled his miralax. Will continue to monitor.

## 2015-07-16 NOTE — Assessment & Plan Note (Signed)
He mentioned having heart burn on ROS. I started him on Protonix back in September and he states he is taking it. Will continue to monitor symptoms.

## 2015-07-16 NOTE — Assessment & Plan Note (Signed)
He had a mild normocytic anemia back in September which I attributed to his hemoptysis. However, this has resolved per the patient and I rechecked his Hgb and is still mildly low at 12.0. I will obtain iron studies at his next visit. He just had a colonoscopy last month which was normal so likely not related to a GI malignancy. His Cr was slightly up from his baseline of 0.9 to 1.15 but he does not have significant renal disease to explain his anemia. Will continue to work up.

## 2015-07-17 NOTE — Progress Notes (Signed)
Case discussed with Dr. Rivet at the time of the visit.  We reviewed the resident's history and exam and pertinent patient test results.  I agree with the assessment, diagnosis and plan of care documented in the resident's note. 

## 2015-07-21 ENCOUNTER — Ambulatory Visit (HOSPITAL_COMMUNITY)
Admission: RE | Admit: 2015-07-21 | Discharge: 2015-07-21 | Disposition: A | Discharge status: Home / Self Care | Payer: Medicare HMO | Source: Ambulatory Visit | Attending: Internal Medicine | Admitting: Internal Medicine

## 2015-07-21 ENCOUNTER — Encounter (HOSPITAL_COMMUNITY): Payer: Medicare HMO

## 2015-07-21 DIAGNOSIS — J302 Other seasonal allergic rhinitis: Secondary | ICD-10-CM

## 2015-07-21 LAB — PULMONARY FUNCTION TEST
DL/VA % pred: 101 %
DL/VA: 4.34 ml/min/mmHg/L
DLCO UNC: 20.9 ml/min/mmHg
DLCO unc % pred: 81 %
FEF 25-75 Post: 2.68 L/sec
FEF 25-75 Pre: 1.79 L/sec
FEF2575-%CHANGE-POST: 50 %
FEF2575-%PRED-POST: 107 %
FEF2575-%Pred-Pre: 71 %
FEV1-%CHANGE-POST: 13 %
FEV1-%PRED-PRE: 79 %
FEV1-%Pred-Post: 90 %
FEV1-POST: 2.33 L
FEV1-Pre: 2.05 L
FEV1FVC-%Change-Post: 2 %
FEV1FVC-%PRED-PRE: 100 %
FEV6-%Change-Post: 10 %
FEV6-%Pred-Post: 91 %
FEV6-%Pred-Pre: 82 %
FEV6-POST: 2.9 L
FEV6-PRE: 2.62 L
FEV6FVC-%PRED-POST: 104 %
FEV6FVC-%Pred-Pre: 104 %
FVC-%CHANGE-POST: 10 %
FVC-%PRED-POST: 87 %
FVC-%PRED-PRE: 79 %
FVC-POST: 2.9 L
FVC-PRE: 2.62 L
PRE FEV6/FVC RATIO: 100 %
Post FEV1/FVC ratio: 80 %
Post FEV6/FVC ratio: 100 %
Pre FEV1/FVC ratio: 78 %
RV % PRED: 126 %
RV: 2.47 L
TLC % pred: 96 %
TLC: 5.78 L

## 2015-07-21 MED ORDER — ALBUTEROL SULFATE (2.5 MG/3ML) 0.083% IN NEBU
2.5000 mg | INHALATION_SOLUTION | Freq: Once | RESPIRATORY_TRACT | Status: AC
  Administered 2015-07-21: 2.5 mg via RESPIRATORY_TRACT

## 2015-07-29 ENCOUNTER — Other Ambulatory Visit: Payer: Self-pay | Admitting: Internal Medicine

## 2015-07-29 NOTE — Telephone Encounter (Signed)
Patient in office requesting refill of medication for "cramps". States it is abdominal cramping. No current prescription for this medication and unsure of the name.

## 2015-08-29 IMAGING — US US ABDOMEN LIMITED
1 series · 14 of 25 positions shown · non-contrast
Comparison: None.

CLINICAL DATA: Right upper quadrant pain.

EXAM:
US ABDOMEN LIMITED - RIGHT UPPER QUADRANT

[Series 1: us abdomen limited · 0.24mm/px · 14 of 31 slices shown]
[im 1/31]
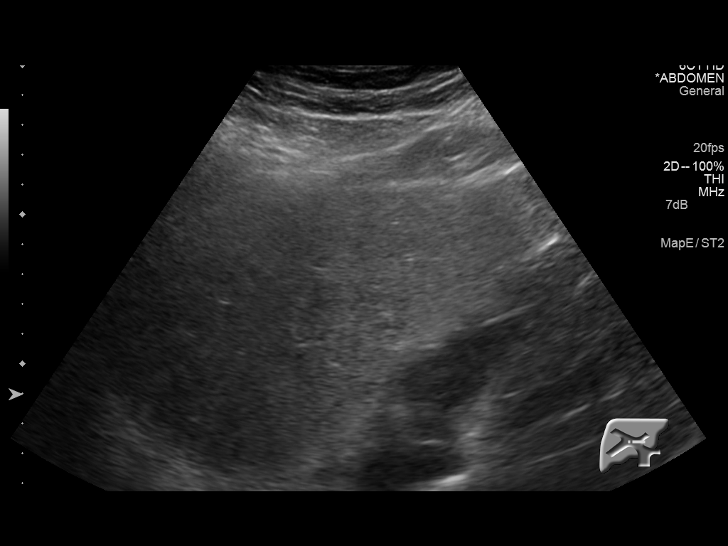
[im 3/31]
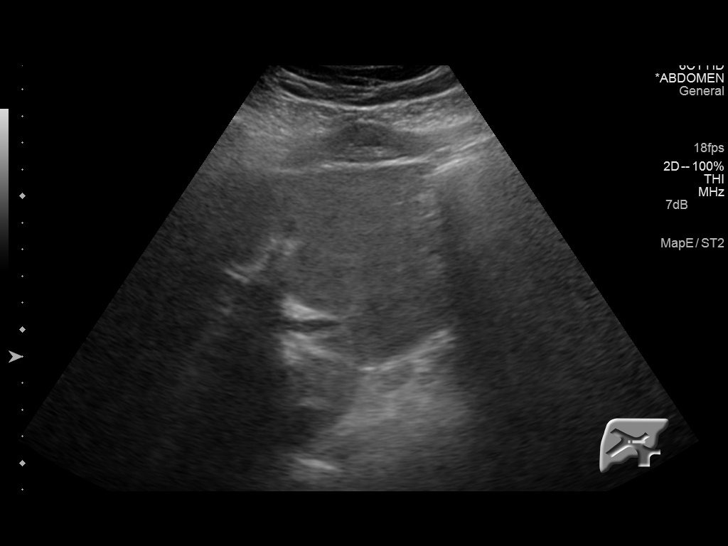
[im 6/31]
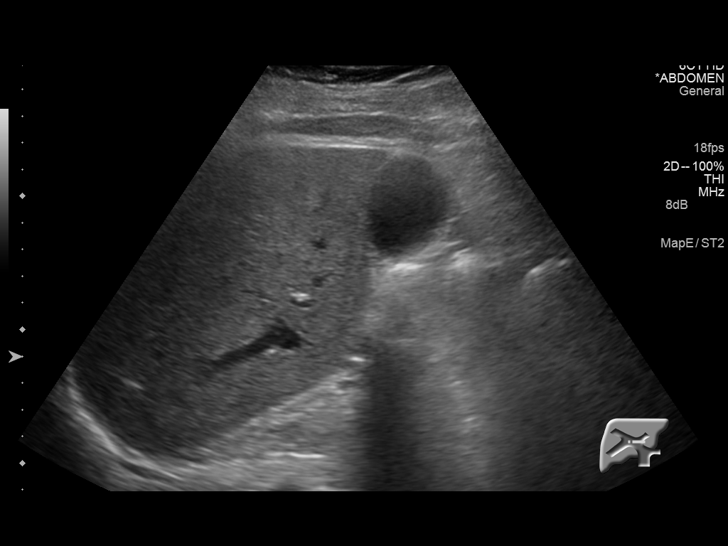
[im 8/31]
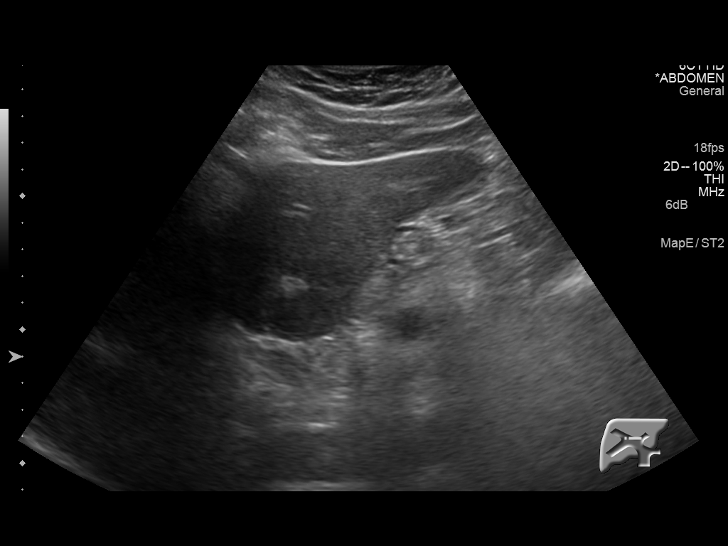
[im 11/31]
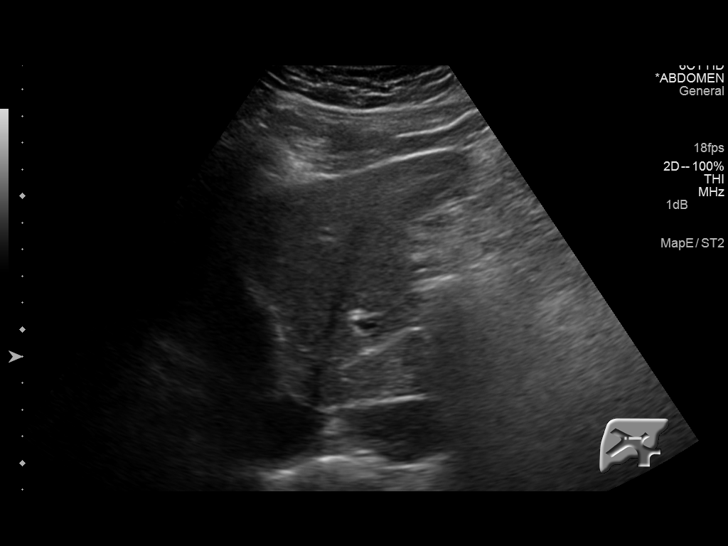
[im 12/31]
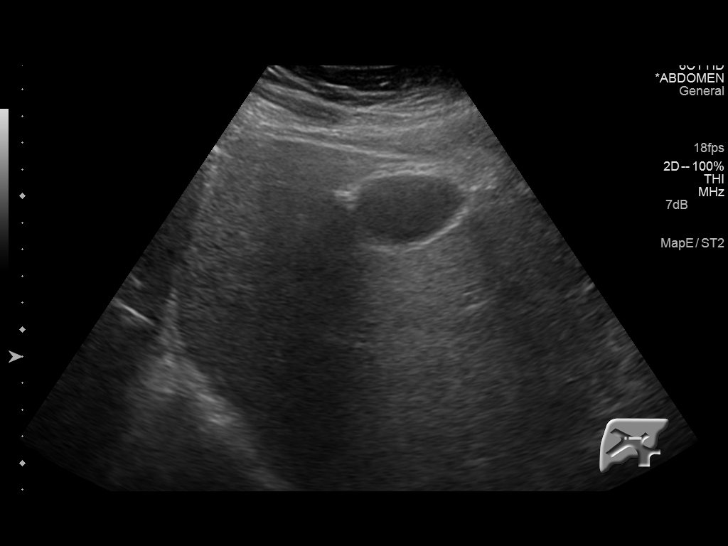
[im 14/31]
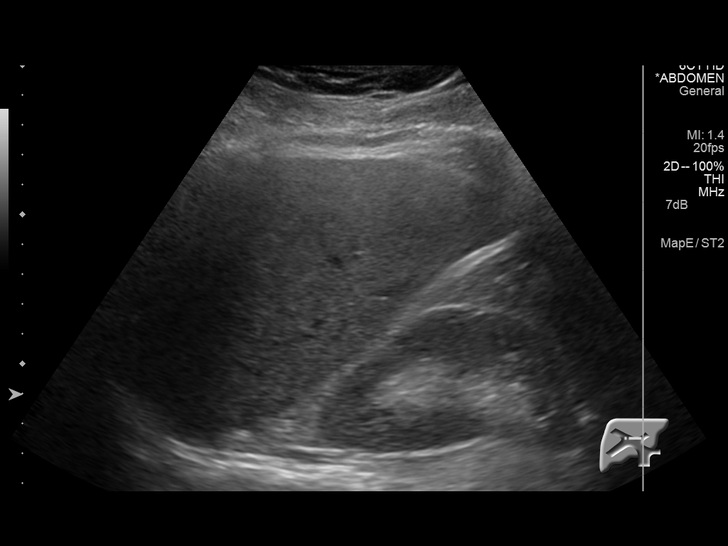
[im 17/31]
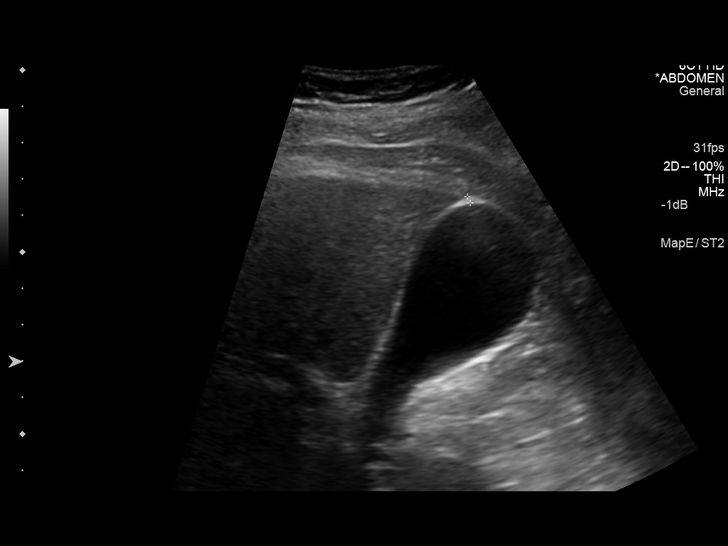
[im 19/31]
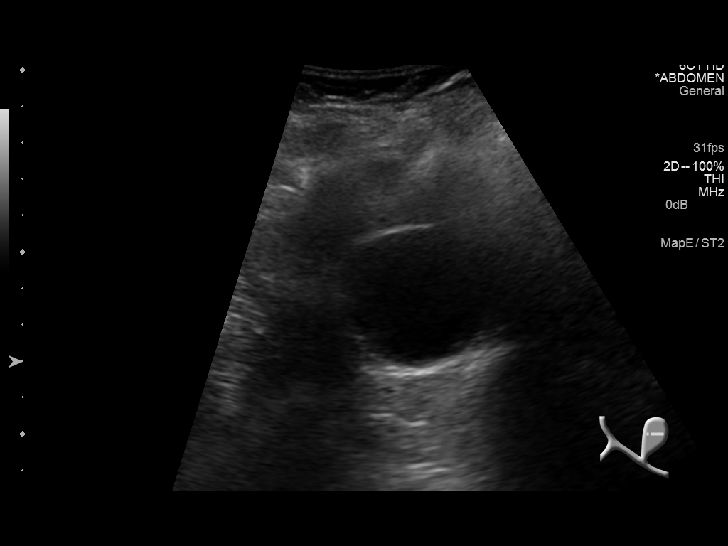
[im 21/31]
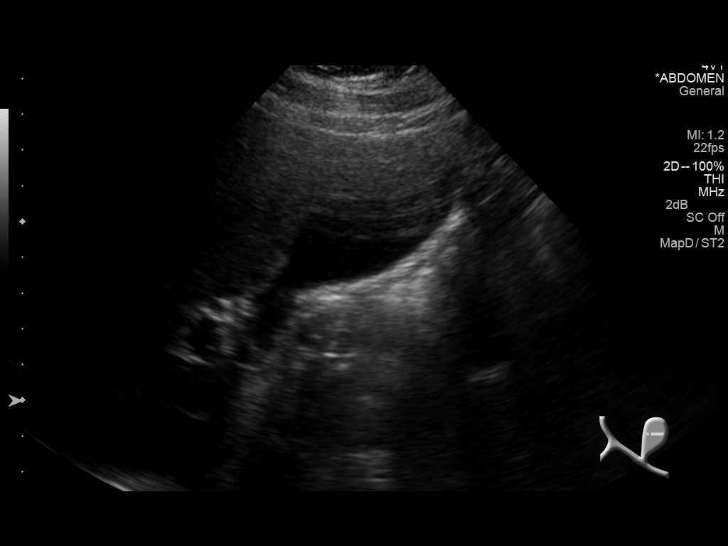
[im 23/31]
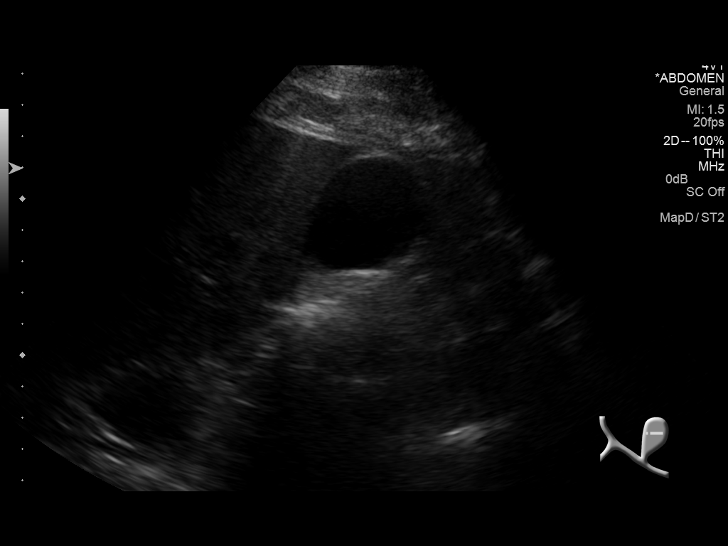
[im 26/31]
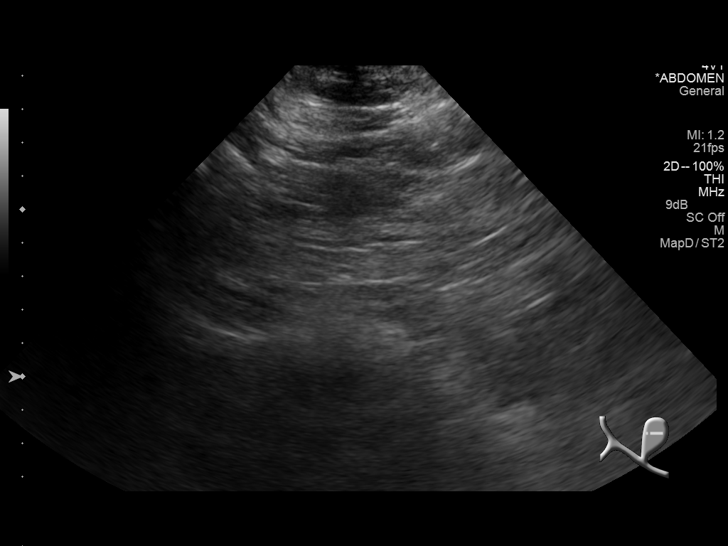
[im 28/31]
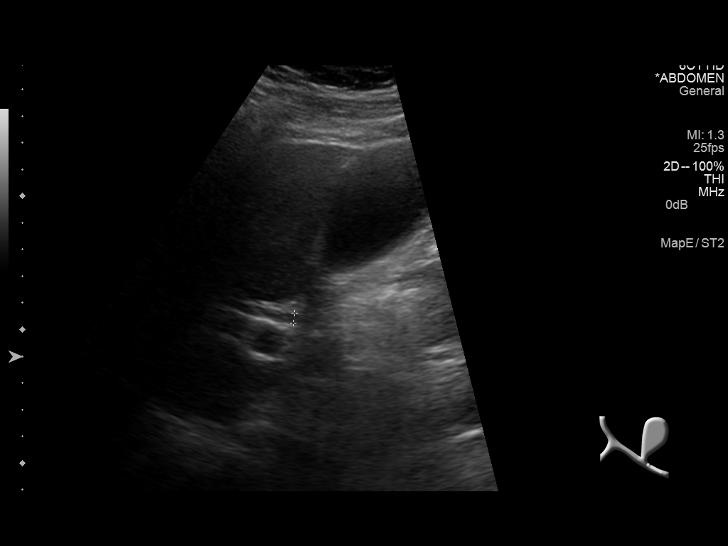
[im 31/31]
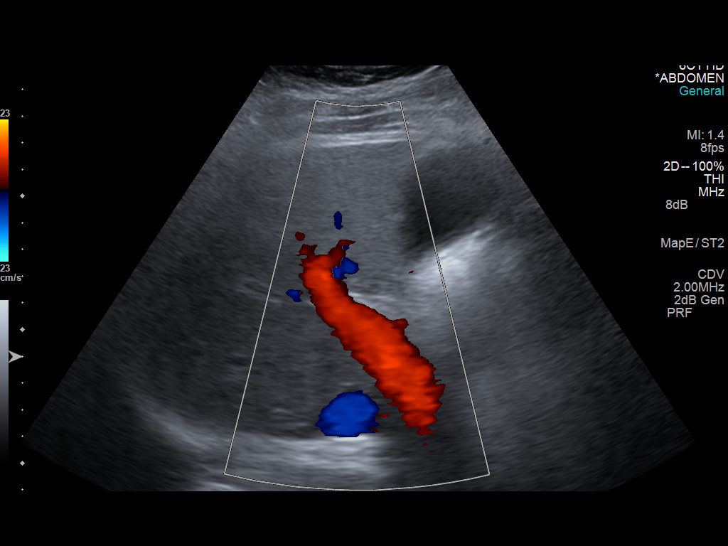

[14 of 25 positions shown; findings below may reference images not displayed]

FINDINGS: Gallbladder:

No gallstones or wall thickening visualized. No sonographic Murphy
sign noted.

Common bile duct:

Diameter: 3.8 mm

Liver:

No focal lesion identified. Within normal limits in parenchymal
echogenicity.
IMPRESSION: Normal exam.

## 2015-08-30 ENCOUNTER — Ambulatory Visit: Payer: Medicare HMO | Admitting: Pulmonary Disease

## 2015-08-30 ENCOUNTER — Encounter: Payer: Self-pay | Admitting: Internal Medicine

## 2015-10-20 ENCOUNTER — Ambulatory Visit (INDEPENDENT_AMBULATORY_CARE_PROVIDER_SITE_OTHER): Payer: Medicare HMO | Admitting: Internal Medicine

## 2015-10-20 DIAGNOSIS — R42 Dizziness and giddiness: Secondary | ICD-10-CM | POA: Diagnosis not present

## 2015-10-20 DIAGNOSIS — R042 Hemoptysis: Secondary | ICD-10-CM

## 2015-10-20 NOTE — Assessment & Plan Note (Signed)
Denies any hemoptysis

## 2015-10-20 NOTE — Patient Instructions (Signed)
Thank you for your visit today Please let us know if you have any dizziness again Please follow up with your PCP

## 2015-10-20 NOTE — Assessment & Plan Note (Signed)
Pt says that he felt 'dizzy' yesterday evening when he was in his backyard in the heat. He describes the dizziness as more lightheadedness. He denies any prodromal symptoms. Denies any palpitations. No history of cardiac problems. Denies vertigo or room spinning sensation. Says his symptoms improved in about 5 minutes when he drank some water. He denies prior history of similar episodes, and he denies passing out yesterday. No tinnitus. No chest pain or SOB  On exam, orthostatics were normal. BP normal. HR 83. Neurologic exam was unremarkable. No nystagmus observed.   Most likely this seems to be heat exertional presyncope  -Advised to stay hydrated -RTC if it happens again

## 2015-10-20 NOTE — Progress Notes (Signed)
    CC: dizzy spell yesterday HPI: [name] is a 60 y.o. man with PMH noted below here for dizziness yesterday, who feels well today.  Please see Problem List/A&P for the status of the patient's chronic medical problems   Past Medical History:  Diagnosis Date  . Allergy   . Asthma   . Colon polyps    noted 10/2011 colonoscopy  . Diverticulosis    mild noted 10/2011  . GERD (gastroesophageal reflux disease)   . Hiatal hernia    small noted 10/2011  . Hiatal hernia    small  . HLD (hyperlipidemia)   . Hypertension   . Parasomnia    movement with sleep  . Pyrosis   . Sleep apnea    no cpap  . Varicose veins of both lower extremities with pain    right>left    Review of Systems:  Denies fevers, chills Denies current dizziness Denies n/v/abd pain   Physical Exam: Vitals:   10/20/15 1515  Temp: 98.6 F (37 C)  TempSrc: Oral  SpO2: 100%  Weight: 237 lb 8 oz (107.7 kg)    General: A&O, in NAD HEENT: EOMI, PERRLA CV: RRR, normal s1, s2, no m/r/g, no carotid bruits appreciated Resp: equal and symmetric breath sounds, no wheezing or crackles  Abdomen: soft, nontender, nondistended, +BS  Neurologic exam: DTRs: 2+ and symmetric. No hyperreflexia  Sensory: intact to light touch Motor: 5/5 strength in upper, 5/5 in lower extremities, normal muscle tone  On visual field exam- did seem to have some vision problem, but pt later says he is illiterate and can nt read numbers. He was able to recognize colors.    Assessment & Plan:   See encounters tab for problem based medical decision making. Patient discussed with Dr. Evette Doffing

## 2015-10-21 NOTE — Progress Notes (Signed)
Internal Medicine Clinic Attending  Case discussed with Dr. Saraiya at the time of the visit.  We reviewed the resident's history and exam and pertinent patient test results.  I agree with the assessment, diagnosis, and plan of care documented in the resident's note.  

## 2015-11-06 ENCOUNTER — Emergency Department (HOSPITAL_COMMUNITY)
Admission: EM | Admit: 2015-11-06 | Discharge: 2015-11-06 | Disposition: A | Discharge status: Home / Self Care | Payer: Medicare HMO | Attending: Emergency Medicine | Admitting: Emergency Medicine

## 2015-11-06 ENCOUNTER — Encounter (HOSPITAL_COMMUNITY): Payer: Self-pay | Admitting: Emergency Medicine

## 2015-11-06 DIAGNOSIS — Z79899 Other long term (current) drug therapy: Secondary | ICD-10-CM | POA: Diagnosis not present

## 2015-11-06 DIAGNOSIS — J069 Acute upper respiratory infection, unspecified: Secondary | ICD-10-CM | POA: Diagnosis not present

## 2015-11-06 DIAGNOSIS — Z7982 Long term (current) use of aspirin: Secondary | ICD-10-CM | POA: Insufficient documentation

## 2015-11-06 DIAGNOSIS — I10 Essential (primary) hypertension: Secondary | ICD-10-CM | POA: Diagnosis not present

## 2015-11-06 DIAGNOSIS — J45909 Unspecified asthma, uncomplicated: Secondary | ICD-10-CM | POA: Insufficient documentation

## 2015-11-06 DIAGNOSIS — R0981 Nasal congestion: Secondary | ICD-10-CM | POA: Diagnosis present

## 2015-11-06 MED ORDER — PSEUDOEPHEDRINE HCL 30 MG PO TABS: 30.0000 mg | ORAL_TABLET | Freq: Four times a day (QID) | ORAL | 0 refills | Status: DC | PRN

## 2015-11-06 MED ORDER — SALINE SPRAY 0.65 % NA SOLN: 1.0000 | NASAL | 0 refills | Status: DC | PRN

## 2015-11-06 MED ORDER — ALBUTEROL SULFATE HFA 108 (90 BASE) MCG/ACT IN AERS: 2.0000 | INHALATION_SPRAY | RESPIRATORY_TRACT | 3 refills | Status: DC | PRN

## 2015-11-06 NOTE — ED Provider Notes (Signed)
[facility] DEPT Provider Note   CSN: [id number] Arrival date & time: 11/06/15  [id number]  First Provider Contact:  First MD Initiated Contact with Patient 11/06/15 2025      By signing my name below, I, Royce Macadamia, attest that this documentation has been prepared under the direction and in the presence of  Montine Circle, PA-C. Electronically Signed: Royce Macadamia, ED Scribe. 11/06/15. 8:32 PM.   History   Chief Complaint Chief Complaint  Patient presents with  . Nasal Congestion   The history is provided by the patient. No language interpreter was used.    HPI Comments:  [name] [name] is a 60 y.o. male who presents to the Emergency Department complaining  nasal congestion beginning yesterday.  Pt complains of associated cough.   He has tried a saline nasal spray without relief. Pt states he has history of allergies.  He denies contact with sick individuals, sore throat, fever, and history of smoking, excess EtOH consumption and diabetes.   Past Medical History:  Diagnosis Date  . Allergy   . Asthma   . Colon polyps    noted 10/2011 colonoscopy  . Diverticulosis    mild noted 10/2011  . GERD (gastroesophageal reflux disease)   . Hiatal hernia    small noted 10/2011  . Hiatal hernia    small  . HLD (hyperlipidemia)   . Hypertension   . Parasomnia    movement with sleep  . Pyrosis   . Sleep apnea    no cpap  . Varicose veins of both lower extremities with pain    right>left    Patient Active Problem List   Diagnosis Date Noted  . Dizziness and giddiness 10/20/2015  . Constipation 07/16/2015  . Normocytic anemia 07/16/2015  . Shortness of breath 07/16/2015  . Hemoptysis 11/25/2014  . Seasonal allergies 01/07/2014  . Obesity 11/19/2011  . Health care maintenance 11/11/2011  . GERD with apnea 08/15/2011  . OSA (obstructive sleep apnea) 06/21/2011    Class: Chronic  . Dyslipidemia 07/05/2009  . Essential hypertension 07/05/2009  . VARICOSE  VEINS, LOWER EXTREMITIES 07/05/2009    Past Surgical History:  Procedure Laterality Date  . ABDOMINAL SURGERY  3 years ago   pt unknown what it was,states "stomach with tape on it"  . COLONOSCOPY  2013  . POLYPECTOMY         Home Medications    Prior to Admission medications   Medication Sig Start Date End Date Taking? Authorizing Provider  albuterol (PROVENTIL HFA) 108 (90 Base) MCG/ACT inhaler Inhale 2 puffs into the lungs every 6 (six) hours as needed for wheezing. 07/12/15 09/08/17  Juliet Rude, MD  aspirin EC 81 MG tablet Take 81 mg by mouth daily.    Historical Provider, MD  cetirizine (ZYRTEC) 10 MG tablet Take 1 tablet (10 mg total) by mouth daily. 04/07/15   Carly Montey Hora, MD  diphenhydrAMINE (BENADRYL) 25 MG tablet Take 25 mg by mouth every 6 (six) hours as needed for allergies.    Historical Provider, MD  ibuprofen (ADVIL,MOTRIN) 200 MG tablet Take 200 mg by mouth every 6 (six) hours as needed for moderate pain.    Historical Provider, MD  lisinopril-hydrochlorothiazide (PRINZIDE,ZESTORETIC) 10-12.5 MG tablet TAKE ONE TABLET BY MOUTH ONCE DAILY 07/29/15   Juliet Rude, MD  pantoprazole (PROTONIX) 40 MG tablet Take 1 tablet (40 mg total) by mouth daily. 11/25/14   Norval Gable, MD  polyethylene glycol [facility] / Floria Raveling) packet Take 17 g  by mouth daily as needed for moderate constipation. Reported on 07/12/2015 07/12/15   Sindy Guadeloupe Rivet, MD  sodium chloride (OCEAN) 0.65 % SOLN nasal spray Place 2 sprays into both nostrils as needed for congestion. 07/12/15   Juliet Rude, MD    Family History Family History  Problem Relation Age of Onset  . Cancer Mother     died at age 52 uterine cancer   . Diabetes Father   . Crohn's disease Son   . Hypertension Other     uncle  . Colon cancer Neg Hx   . Colon polyps Neg Hx   . Esophageal cancer Neg Hx   . Rectal cancer Neg Hx   . Stomach cancer Neg Hx     Social History Social History  Substance Use Topics  . Smoking  status: Never Smoker  . Smokeless tobacco: Never Used  . Alcohol use 1.2 oz/week    2 Cans of beer per week     Comment: Beer sometimes occasionally.     Allergies   Review of patient's allergies indicates no known allergies.   Review of Systems Review of Systems  Constitutional: Negative for fever.  HENT: Positive for congestion. Negative for sore throat.   Respiratory: Positive for cough.      Physical Exam Updated Vital Signs BP 111/94 (BP Location: Right Arm)   Pulse 91   Temp 98.1 F (36.7 C) (Oral)   SpO2 99%   Physical Exam Physical Exam  Constitutional: Pt  is oriented to person, place, and time. Appears well-developed and well-nourished. No distress.  HENT:  Head: Normocephalic and atraumatic.  Right Ear: Tympanic membrane, external ear and ear canal normal.  Left Ear: Tympanic membrane, external ear and ear canal normal.  Nose: Mucosal edema and moderate rhinorrhea present. No epistaxis. Right sinus exhibits no maxillary sinus tenderness and no frontal sinus tenderness. Left sinus exhibits no maxillary sinus tenderness and no frontal sinus tenderness.  Mouth/Throat: Uvula is midline and mucous membranes are normal. Mucous membranes are not pale and not cyanotic. No oropharyngeal exudate, posterior oropharyngeal edema, posterior oropharyngeal erythema or tonsillar abscesses.  Eyes: Conjunctivae are normal. Pupils are equal, round, and reactive to light.  Neck: Normal range of motion and full passive range of motion without pain.  Cardiovascular: Normal rate and intact distal pulses.   Pulmonary/Chest: Effort normal and breath sounds normal. No stridor.  Clear and equal breath sounds without focal wheezes, rhonchi, rales  Abdominal: Soft. Bowel sounds are normal. There is no tenderness.  Musculoskeletal: Normal range of motion.  Lymphadenopathy:    Pthas no cervical adenopathy.  Neurological: Pt is alert and oriented to person, place, and time.  Skin: Skin is  warm and dry. No rash noted. Pt is not diaphoretic.  Psychiatric: Normal mood and affect.  Nursing note and vitals reviewed.    ED Treatments / Results   DIAGNOSTIC STUDIES:  Oxygen Saturation is 99% on RA, nml by my interpretation.    COORDINATION OF CARE:  8:31 PM Discussed treatment plan with pt at bedside and pt agreed to plan.   Labs (all labs ordered are listed, but only abnormal results are displayed) Labs Reviewed - No data to display  EKG  EKG Interpretation None       Radiology No results found.  Procedures Procedures (including critical care time)  Medications Ordered in ED Medications - No data to display   Initial Impression / Assessment and Plan / ED Course  I have reviewed  the triage vital signs and the nursing notes.  Pertinent labs & imaging results that were available during my care of the patient were reviewed by me and considered in my medical decision making (see chart for details).  Clinical Course    Patient with congestion x 1 day. Patients symptoms are consistent with URI, likely viral etiology. Discussed that antibiotics are not indicated for viral infections. Pt will be discharged with symptomatic treatment.  Verbalizes understanding and is agreeable with plan. Pt is hemodynamically stable & in NAD prior to dc.   Final Clinical Impressions(s) / ED Diagnoses   Final diagnoses:  URI (upper respiratory infection)    New Prescriptions Discharge Medication List as of 11/06/2015  8:34 PM    START taking these medications   Details  !! albuterol (PROVENTIL HFA;VENTOLIN HFA) 108 (90 Base) MCG/ACT inhaler Inhale 2 puffs into the lungs every 4 (four) hours as needed for wheezing or shortness of breath., Starting Sun 11/06/2015, Print    pseudoephedrine (SUDAFED) 30 MG tablet Take 1 tablet (30 mg total) by mouth every 6 (six) hours as needed for congestion., Starting Sun 11/06/2015, Print    !! sodium chloride (OCEAN) 0.65 % SOLN nasal  spray Place 1 spray into both nostrils as needed for congestion., Starting Sun 11/06/2015, Print     !! - Potential duplicate medications found. Please discuss with provider.     I personally performed the services described in this documentation, which was scribed in my presence. The recorded information has been reviewed and is accurate.       Montine Circle, PA-C 11/06/15 2052    Orlie Dakin, MD 11/06/15 [phone]

## 2015-11-06 NOTE — ED Triage Notes (Signed)
Cough and nasal congestion starting yesterday. Productive cough of yellow sputum. Denies SOB or CP.

## 2015-11-07 ENCOUNTER — Other Ambulatory Visit: Payer: Self-pay | Admitting: Internal Medicine

## 2015-11-07 DIAGNOSIS — R0681 Apnea, not elsewhere classified: Principal | ICD-10-CM

## 2015-11-07 DIAGNOSIS — K219 Gastro-esophageal reflux disease without esophagitis: Secondary | ICD-10-CM

## 2015-11-28 ENCOUNTER — Emergency Department (HOSPITAL_COMMUNITY): Payer: Medicare HMO

## 2015-11-28 ENCOUNTER — Encounter (HOSPITAL_COMMUNITY): Payer: Self-pay | Admitting: *Deleted

## 2015-11-28 ENCOUNTER — Emergency Department (HOSPITAL_COMMUNITY)
Admission: EM | Admit: 2015-11-28 | Discharge: 2015-11-28 | Disposition: A | Discharge status: Home / Self Care | Payer: Medicare HMO | Attending: Emergency Medicine | Admitting: Emergency Medicine

## 2015-11-28 DIAGNOSIS — W228XXA Striking against or struck by other objects, initial encounter: Secondary | ICD-10-CM | POA: Diagnosis not present

## 2015-11-28 DIAGNOSIS — S8991XA Unspecified injury of right lower leg, initial encounter: Secondary | ICD-10-CM | POA: Diagnosis present

## 2015-11-28 DIAGNOSIS — Z7982 Long term (current) use of aspirin: Secondary | ICD-10-CM | POA: Insufficient documentation

## 2015-11-28 DIAGNOSIS — Y999 Unspecified external cause status: Secondary | ICD-10-CM | POA: Diagnosis not present

## 2015-11-28 DIAGNOSIS — Z79899 Other long term (current) drug therapy: Secondary | ICD-10-CM | POA: Diagnosis not present

## 2015-11-28 DIAGNOSIS — I10 Essential (primary) hypertension: Secondary | ICD-10-CM | POA: Insufficient documentation

## 2015-11-28 DIAGNOSIS — J45901 Unspecified asthma with (acute) exacerbation: Secondary | ICD-10-CM | POA: Diagnosis not present

## 2015-11-28 DIAGNOSIS — Y929 Unspecified place or not applicable: Secondary | ICD-10-CM | POA: Diagnosis not present

## 2015-11-28 DIAGNOSIS — Y9301 Activity, walking, marching and hiking: Secondary | ICD-10-CM | POA: Insufficient documentation

## 2015-11-28 DIAGNOSIS — S8011XA Contusion of right lower leg, initial encounter: Secondary | ICD-10-CM | POA: Insufficient documentation

## 2015-11-28 DIAGNOSIS — R1110 Vomiting, unspecified: Secondary | ICD-10-CM | POA: Insufficient documentation

## 2015-11-28 LAB — CBC
HEMATOCRIT: 39.5 % (ref 39.0–52.0)
HEMOGLOBIN: 12.7 g/dL — AB (ref 13.0–17.0)
MCH: 29.7 pg (ref 26.0–34.0)
MCHC: 32.2 g/dL (ref 30.0–36.0)
MCV: 92.5 fL (ref 78.0–100.0)
Platelets: 223 10*3/uL (ref 150–400)
RBC: 4.27 MIL/uL (ref 4.22–5.81)
RDW: 13.6 % (ref 11.5–15.5)
WBC: 6.4 10*3/uL (ref 4.0–10.5)

## 2015-11-28 LAB — HEPATIC FUNCTION PANEL
ALK PHOS: 58 U/L (ref 38–126)
ALT: 32 U/L (ref 17–63)
AST: 45 U/L — ABNORMAL HIGH (ref 15–41)
Albumin: 3.9 g/dL (ref 3.5–5.0)
BILIRUBIN DIRECT: 0.2 mg/dL (ref 0.1–0.5)
BILIRUBIN INDIRECT: 0.5 mg/dL (ref 0.3–0.9)
Total Bilirubin: 0.7 mg/dL (ref 0.3–1.2)
Total Protein: 6.9 g/dL (ref 6.5–8.1)

## 2015-11-28 LAB — BASIC METABOLIC PANEL
ANION GAP: 4 — AB (ref 5–15)
BUN: 19 mg/dL (ref 6–20)
CALCIUM: 9.2 mg/dL (ref 8.9–10.3)
CO2: 28 mmol/L (ref 22–32)
Chloride: 109 mmol/L (ref 101–111)
Creatinine, Ser: 1.43 mg/dL — ABNORMAL HIGH (ref 0.61–1.24)
GFR calc Af Amer: >60 mL/min (ref >60)
GFR calc non Af Amer: 52 mL/min — ABNORMAL LOW (ref >60)
GLUCOSE: 114 mg/dL — AB (ref 65–99)
POTASSIUM: 3.9 mmol/L (ref 3.5–5.1)
Sodium: 141 mmol/L (ref 135–145)

## 2015-11-28 LAB — TROPONIN I: Troponin I: <0.03 ng/mL (ref <0.03)

## 2015-11-28 LAB — LIPASE, BLOOD: Lipase: 21 U/L (ref 11–51)

## 2015-11-28 MED ORDER — METHYLPREDNISOLONE SODIUM SUCC 125 MG IJ SOLR
125.0000 mg | Freq: Once | INTRAMUSCULAR | Status: AC
  Administered 2015-11-28: 125 mg via INTRAVENOUS
  Filled 2015-11-28: qty 2

## 2015-11-28 MED ORDER — IPRATROPIUM BROMIDE 0.02 % IN SOLN
0.5000 mg | Freq: Once | RESPIRATORY_TRACT | Status: AC
  Administered 2015-11-28: 0.5 mg via RESPIRATORY_TRACT
  Filled 2015-11-28: qty 2.5

## 2015-11-28 MED ORDER — ALBUTEROL (5 MG/ML) CONTINUOUS INHALATION SOLN
5.0000 mg/h | INHALATION_SOLUTION | Freq: Once | RESPIRATORY_TRACT | Status: AC
  Administered 2015-11-28: 5 mg/h via RESPIRATORY_TRACT
  Filled 2015-11-28: qty 20

## 2015-11-28 MED ORDER — GUAIFENESIN-DM 100-10 MG/5ML PO SYRP: 5.0000 mL | ORAL_SOLUTION | ORAL | 0 refills | Status: DC | PRN

## 2015-11-28 MED ORDER — PREDNISONE 20 MG PO TABS: 60.0000 mg | ORAL_TABLET | Freq: Every day | ORAL | 0 refills | Status: DC

## 2015-11-28 MED ORDER — GUAIFENESIN-DM 100-10 MG/5ML PO SYRP
5.0000 mL | ORAL_SOLUTION | Freq: Once | ORAL | Status: AC
  Administered 2015-11-28: 5 mL via ORAL
  Filled 2015-11-28: qty 5

## 2015-11-28 MED ORDER — ONDANSETRON 4 MG PO TBDP: 4.0000 mg | ORAL_TABLET | Freq: Three times a day (TID) | ORAL | 0 refills | Status: DC | PRN

## 2015-11-28 MED ORDER — SODIUM CHLORIDE 0.9 % IV BOLUS (SEPSIS)
1000.0000 mL | Freq: Once | INTRAVENOUS | Status: AC
  Administered 2015-11-28: 1000 mL via INTRAVENOUS

## 2015-11-28 MED ORDER — ONDANSETRON HCL 4 MG/2ML IJ SOLN
8.0000 mg | Freq: Once | INTRAMUSCULAR | Status: AC
  Administered 2015-11-28: 8 mg via INTRAVENOUS
  Filled 2015-11-28: qty 4

## 2015-11-28 NOTE — ED Provider Notes (Signed)
TIME SEEN: 3:15 AM  CHIEF COMPLAINT: Cough, wheezing  HPI: Pt is a 60 y.o. M with history of asthma, hypertension, hyperlipidemia who presents emergency department with one day of coughing, wheezing. States he is coughing up yellow, clear phlegm. He is having some posttussive emesis. No chest pain or shortness of breath. No abdominal pain or diarrhea. States he has been using his inhaler at home without much relief. He is requesting a "breathing machine".   Also states that he hit his right shin on something while walking yesterday and is having pain in this area. Is requesting an x-ray. Has been ambulatory.  ROS: See HPI Constitutional: no fever  Eyes: no drainage  ENT: no runny nose   Cardiovascular:  no chest pain  Resp: no SOB  GI: Posttussive vomiting GU: no dysuria Integumentary: no rash  Allergy: no hives  Musculoskeletal: no leg swelling  Neurological: no slurred speech ROS otherwise negative  PAST MEDICAL HISTORY/PAST SURGICAL HISTORY:  Past Medical History:  Diagnosis Date  . Allergy   . Asthma   . Colon polyps    noted 10/2011 colonoscopy  . Diverticulosis    mild noted 10/2011  . GERD (gastroesophageal reflux disease)   . Hiatal hernia    small noted 10/2011  . Hiatal hernia    small  . HLD (hyperlipidemia)   . Hypertension   . Parasomnia    movement with sleep  . Pyrosis   . Sleep apnea    no cpap  . Varicose veins of both lower extremities with pain    right>left    MEDICATIONS:  Prior to Admission medications   Medication Sig Start Date End Date Taking? Authorizing Provider  albuterol (PROVENTIL HFA) 108 (90 Base) MCG/ACT inhaler Inhale 2 puffs into the lungs every 6 (six) hours as needed for wheezing. 07/12/15 09/08/17  Carly Montey Hora, MD  albuterol (PROVENTIL HFA;VENTOLIN HFA) 108 (90 Base) MCG/ACT inhaler Inhale 2 puffs into the lungs every 4 (four) hours as needed for wheezing or shortness of breath. 11/06/15   Montine Circle, PA-C  aspirin EC 81 MG  tablet Take 81 mg by mouth daily.    Historical Provider, MD  cetirizine (ZYRTEC) 10 MG tablet Take 1 tablet (10 mg total) by mouth daily. 04/07/15   Carly Montey Hora, MD  diphenhydrAMINE (BENADRYL) 25 MG tablet Take 25 mg by mouth every 6 (six) hours as needed for allergies.    Historical Provider, MD  ibuprofen (ADVIL,MOTRIN) 200 MG tablet Take 200 mg by mouth every 6 (six) hours as needed for moderate pain.    Historical Provider, MD  lisinopril-hydrochlorothiazide (PRINZIDE,ZESTORETIC) 10-12.5 MG tablet TAKE ONE TABLET BY MOUTH ONCE DAILY 07/29/15   Sindy Guadeloupe Rivet, MD  pantoprazole (PROTONIX) 40 MG tablet TAKE ONE TABLET BY MOUTH ONCE DAILY 11/07/15   Juliet Rude, MD  polyethylene glycol (MIRALAX / GLYCOLAX) packet Take 17 g by mouth daily as needed for moderate constipation. Reported on 07/12/2015 07/12/15   Juliet Rude, MD  pseudoephedrine (SUDAFED) 30 MG tablet Take 1 tablet (30 mg total) by mouth every 6 (six) hours as needed for congestion. 11/06/15   Montine Circle, PA-C  sodium chloride (OCEAN) 0.65 % SOLN nasal spray Place 2 sprays into both nostrils as needed for congestion. 07/12/15   Carly Montey Hora, MD  sodium chloride (OCEAN) 0.65 % SOLN nasal spray Place 1 spray into both nostrils as needed for congestion. 11/06/15   Montine Circle, PA-C    ALLERGIES:  No Known  Allergies  SOCIAL HISTORY:  Social History  Substance Use Topics  . Smoking status: Never Smoker  . Smokeless tobacco: Never Used  . Alcohol use 1.2 oz/week    2 Cans of beer per week     Comment: Beer sometimes occasionally.    FAMILY HISTORY: Family History  Problem Relation Age of Onset  . Cancer Mother     died at age 53 uterine cancer   . Diabetes Father   . Crohn's disease Son   . Hypertension Other     uncle  . Colon cancer Neg Hx   . Colon polyps Neg Hx   . Esophageal cancer Neg Hx   . Rectal cancer Neg Hx   . Stomach cancer Neg Hx     EXAM: BP 96/76   Pulse 111   Temp 98.6 F (37 C)   Resp 18    Ht 5\' 6"  (1.676 m)   Wt 230 lb 2 oz (104.4 kg)   SpO2 97%   BMI 37.14 kg/m  CONSTITUTIONAL: Alert and oriented and responds appropriately to questions. Well-appearing; well-nourished HEAD: Normocephalic EYES: Conjunctivae clear, PERRL ENT: normal nose; no rhinorrhea; moist mucous membranes NECK: Supple, no meningismus, no LAD  CARD: RRR; S1 and S2 appreciated; no murmurs, no clicks, no rubs, no gallops RESP: Normal chest excursion without splinting or tachypnea; breath sounds Equal bilaterally but diminished at his bases bilaterally, mild scattered expiratory wheezes bilaterally, no rhonchi, no rales, no hypoxia or respiratory distress, speaking full sentences ABD/GI: Normal bowel sounds; non-distended; soft, non-tender, no rebound, no guarding, no peritoneal signs BACK:  The back appears normal and is non-tender to palpation, there is no CVA tenderness EXT: Tender to palpation over the right distal shin with area of ecchymosis but no bony deformity, components are soft, joint effusion, Normal ROM in all joints; otherwise external is are non-tender to palpation; no edema; normal capillary refill; no cyanosis, no calf tenderness or swelling, extremities are warm and well-perfused   SKIN: Normal color for age and race; warm; no rash NEURO: Moves all extremities equally, sensation to light touch intact diffusely, cranial nerves II through XII intact PSYCH: The patient's mood and manner are appropriate. Grooming and personal hygiene are appropriate.  MEDICAL DECISION MAKING: Patient here with cough, posttussive emesis, wheezing. Has a history of asthma. We'll give albuterol, Atrovent, Solu-Medrol. He does have some posttussive emesis. Abdominal exam benign. We'll give Zofran, IV fluids. We'll treat his symptoms with dextromethorphan, guaifenesin. Labs ordered in triage are unremarkable including negative troponin. Chest x-ray is pending.  ED PROGRESS: Chest x-ray shows no infiltrate, pneumothorax or  edema. X-ray of his right tibia, fibula is normal. He has a history of varicose veins. No calf tenderness or swelling to suggest DVT. Pain started after he hit his leg. Lungs are now clear after albuterol, Atrovent. He reports feeling much better. No further coughing, posttussive emesis. States he has an albuterol inhaler at home. We'll discharge with prednisone burst, cough medication, Zofran. Discussed return precautions. PCP is with internal medicine at [facility]  At this time, I do not feel there is any life-threatening condition present. I have reviewed and discussed all results (EKG, imaging, lab, urine as appropriate), exam findings with patient/family. I have reviewed nursing notes and appropriate previous records.  I feel the patient is safe to be discharged home without further emergent workup and can continue workup as an outpatient as needed. Discussed usual and customary return precautions. Patient/family verbalize understanding and are comfortable with  this plan.  Outpatient follow-up has been provided. All questions have been answered.    EKG Interpretation  Date/Time:  Monday November 28 2015 02:11:54 EDT Ventricular Rate:  116 PR Interval:    QRS Duration: 84 QT Interval:  358 QTC Calculation: 497 R Axis:   57 Text Interpretation:  Sinus tachycardia with Premature supraventricular complexes Otherwise normal ECG No significant change since last tracing other than rate is faster Confirmed by WARD,  DO, KRISTEN [id number]) on 11/28/2015 2:34:08 AM         Jeannette, DO 11/28/15 [id number]

## 2015-11-28 NOTE — ED Triage Notes (Signed)
Pt vomiting while being triaged

## 2015-11-28 NOTE — ED Notes (Signed)
Pt transported to Xray. 

## 2015-11-28 NOTE — ED Triage Notes (Signed)
The npt has been coughing making him vomit  He woke up vomiting

## 2015-11-28 NOTE — ED Notes (Signed)
Pt reports he went to lay down to go to bed and felt the urge to vomit. States he vomited some but most got trapped in the back of his throat. States he has had shortness of breath for a while now. States he has been constipated for a while to the point of having to strain to try to have a BM and periodically seeing blood.

## 2015-12-05 ENCOUNTER — Ambulatory Visit: Payer: Medicare HMO

## 2015-12-12 ENCOUNTER — Ambulatory Visit: Payer: Medicare HMO

## 2015-12-13 ENCOUNTER — Encounter: Payer: Self-pay | Admitting: Internal Medicine

## 2016-01-08 ENCOUNTER — Encounter (HOSPITAL_COMMUNITY): Payer: Self-pay | Admitting: Emergency Medicine

## 2016-01-08 ENCOUNTER — Emergency Department (HOSPITAL_COMMUNITY): Payer: Medicare HMO

## 2016-01-08 ENCOUNTER — Emergency Department (HOSPITAL_COMMUNITY)
Admission: EM | Admit: 2016-01-08 | Discharge: 2016-01-08 | Disposition: A | Discharge status: Home / Self Care | Payer: Medicare HMO | Attending: Emergency Medicine | Admitting: Emergency Medicine

## 2016-01-08 DIAGNOSIS — Z79899 Other long term (current) drug therapy: Secondary | ICD-10-CM | POA: Insufficient documentation

## 2016-01-08 DIAGNOSIS — R079 Chest pain, unspecified: Secondary | ICD-10-CM

## 2016-01-08 DIAGNOSIS — I10 Essential (primary) hypertension: Secondary | ICD-10-CM | POA: Diagnosis not present

## 2016-01-08 DIAGNOSIS — J45909 Unspecified asthma, uncomplicated: Secondary | ICD-10-CM | POA: Diagnosis not present

## 2016-01-08 DIAGNOSIS — Z7982 Long term (current) use of aspirin: Secondary | ICD-10-CM | POA: Insufficient documentation

## 2016-01-08 DIAGNOSIS — R072 Precordial pain: Secondary | ICD-10-CM | POA: Diagnosis not present

## 2016-01-08 LAB — I-STAT TROPONIN, ED
TROPONIN I, POC: 0 ng/mL (ref 0.00–0.08)
TROPONIN I, POC: 0.01 ng/mL (ref 0.00–0.08)

## 2016-01-08 LAB — CBC
HCT: 37.3 % — ABNORMAL LOW (ref 39.0–52.0)
Hemoglobin: 12.3 g/dL — ABNORMAL LOW (ref 13.0–17.0)
MCH: 30.1 pg (ref 26.0–34.0)
MCHC: 33 g/dL (ref 30.0–36.0)
MCV: 91.2 fL (ref 78.0–100.0)
PLATELETS: 216 10*3/uL (ref 150–400)
RBC: 4.09 MIL/uL — ABNORMAL LOW (ref 4.22–5.81)
RDW: 13.9 % (ref 11.5–15.5)
WBC: 6.2 10*3/uL (ref 4.0–10.5)

## 2016-01-08 LAB — BASIC METABOLIC PANEL
Anion gap: 9 (ref 5–15)
BUN: 16 mg/dL (ref 6–20)
CALCIUM: 9.6 mg/dL (ref 8.9–10.3)
CHLORIDE: 105 mmol/L (ref 101–111)
CO2: 26 mmol/L (ref 22–32)
CREATININE: 0.92 mg/dL (ref 0.61–1.24)
GFR calc non Af Amer: >60 mL/min (ref >60)
Glucose, Bld: 100 mg/dL — ABNORMAL HIGH (ref 65–99)
Potassium: 3.6 mmol/L (ref 3.5–5.1)
SODIUM: 140 mmol/L (ref 135–145)

## 2016-01-08 MED ORDER — PANTOPRAZOLE SODIUM 20 MG PO TBEC: 40.0000 mg | DELAYED_RELEASE_TABLET | Freq: Every day | ORAL | 0 refills | Status: DC

## 2016-01-08 MED ORDER — GI COCKTAIL ~~LOC~~
30.0000 mL | Freq: Once | ORAL | Status: AC
  Administered 2016-01-08: 30 mL via ORAL
  Filled 2016-01-08: qty 30

## 2016-01-08 NOTE — ED Provider Notes (Signed)
[facility] DEPT Provider Note   CSN: [id number] Arrival date & time: 01/08/16  1448     History   Chief Complaint Chief Complaint  Patient presents with  . Chest Pain    HPI [name] [name] is a 60 y.o. male.  The history is provided by the patient and medical records.    60 year old male with history of allergies, asthma, GERD, hyperlipidemia, hypertension, sleep apnea, presenting to the ED for chest pain.  Patient states this began today while he was in [place]. States when pain comes it is sharp, localized to his midsternal region and left side of his chest. States this is sharp, stabbing. States this will last 1-2 seconds before resolving completely. He states it feels like a "twinge". He denies any associated shortness of breath, nausea, diaphoresis, dizziness, numbness, pain of the arms or neck. He has no known cardiac history. Reports his uncle did have heart disease. He is not currently a smoker. Patient reports this does feel somewhat like his acid reflux. He states he ran out of his reflux medicine about 2 weeks ago. He has been eating spicy foods such as chicken wings recently. No abdominal pain, vomiting, or diarrhea.  Past Medical History:  Diagnosis Date  . Allergy   . Asthma   . Colon polyps    noted 10/2011 colonoscopy  . Diverticulosis    mild noted 10/2011  . GERD (gastroesophageal reflux disease)   . Hiatal hernia    small noted 10/2011  . Hiatal hernia    small  . HLD (hyperlipidemia)   . Hypertension   . Parasomnia    movement with sleep  . Pyrosis   . Sleep apnea    no cpap  . Varicose veins of both lower extremities with pain    right>left    Patient Active Problem List   Diagnosis Date Noted  . Dizziness and giddiness 10/20/2015  . Constipation 07/16/2015  . Normocytic anemia 07/16/2015  . Shortness of breath 07/16/2015  . Hemoptysis 11/25/2014  . Seasonal allergies 01/07/2014  . Obesity 11/19/2011  . Health care maintenance  11/11/2011  . GERD with apnea 08/15/2011  . OSA (obstructive sleep apnea) 06/21/2011    Class: Chronic  . Dyslipidemia 07/05/2009  . Essential hypertension 07/05/2009  . VARICOSE VEINS, LOWER EXTREMITIES 07/05/2009    Past Surgical History:  Procedure Laterality Date  . ABDOMINAL SURGERY  3 years ago   pt unknown what it was,states "stomach with tape on it"  . COLONOSCOPY  2013  . POLYPECTOMY         Home Medications    Prior to Admission medications   Medication Sig Start Date End Date Taking? Authorizing Provider  albuterol (PROVENTIL HFA) 108 (90 Base) MCG/ACT inhaler Inhale 2 puffs into the lungs every 6 (six) hours as needed for wheezing. 07/12/15 09/08/17  Carly Montey Hora, MD  albuterol (PROVENTIL HFA;VENTOLIN HFA) 108 (90 Base) MCG/ACT inhaler Inhale 2 puffs into the lungs every 4 (four) hours as needed for wheezing or shortness of breath. 11/06/15   Montine Circle, PA-C  aspirin EC 81 MG tablet Take 81 mg by mouth daily.    Historical Provider, MD  cetirizine (ZYRTEC) 10 MG tablet Take 1 tablet (10 mg total) by mouth daily. 04/07/15   Carly Montey Hora, MD  diphenhydrAMINE (BENADRYL) 25 MG tablet Take 25 mg by mouth every 6 (six) hours as needed for allergies.    Historical Provider, MD  guaiFENesin-dextromethorphan (ROBITUSSIN DM) 100-10 MG/5ML syrup Take 5  mLs by mouth every 4 (four) hours as needed for cough. 11/28/15   Kristen N Ward, DO  ibuprofen (ADVIL,MOTRIN) 200 MG tablet Take 200 mg by mouth every 6 (six) hours as needed for moderate pain.    Historical Provider, MD  lisinopril-hydrochlorothiazide (PRINZIDE,ZESTORETIC) 10-12.5 MG tablet TAKE ONE TABLET BY MOUTH ONCE DAILY 07/29/15   Juliet Rude, MD  ondansetron (ZOFRAN ODT) 4 MG disintegrating tablet Take 1 tablet (4 mg total) by mouth every 8 (eight) hours as needed for nausea or vomiting. 11/28/15   Kristen N Ward, DO  pantoprazole (PROTONIX) 40 MG tablet TAKE ONE TABLET BY MOUTH ONCE DAILY 11/07/15   Juliet Rude, MD    polyethylene glycol (MIRALAX / GLYCOLAX) packet Take 17 g by mouth daily as needed for moderate constipation. Reported on 07/12/2015 07/12/15   Sindy Guadeloupe Rivet, MD  predniSONE (DELTASONE) 20 MG tablet Take 3 tablets (60 mg total) by mouth daily. 11/28/15   Kristen N Ward, DO  pseudoephedrine (SUDAFED) 30 MG tablet Take 1 tablet (30 mg total) by mouth every 6 (six) hours as needed for congestion. 11/06/15   Montine Circle, PA-C  sodium chloride (OCEAN) 0.65 % SOLN nasal spray Place 2 sprays into both nostrils as needed for congestion. 07/12/15   Carly Montey Hora, MD  sodium chloride (OCEAN) 0.65 % SOLN nasal spray Place 1 spray into both nostrils as needed for congestion. 11/06/15   Montine Circle, PA-C    Family History Family History  Problem Relation Age of Onset  . Cancer Mother     died at age 15 uterine cancer   . Diabetes Father   . Crohn's disease Son   . Hypertension Other     uncle  . Colon cancer Neg Hx   . Colon polyps Neg Hx   . Esophageal cancer Neg Hx   . Rectal cancer Neg Hx   . Stomach cancer Neg Hx     Social History Social History  Substance Use Topics  . Smoking status: Never Smoker  . Smokeless tobacco: Never Used  . Alcohol use 1.2 oz/week    2 Cans of beer per week     Comment: Beer sometimes occasionally.     Allergies   Review of patient's allergies indicates no known allergies.   Review of Systems Review of Systems  Cardiovascular: Positive for chest pain.  All other systems reviewed and are negative.    Physical Exam Updated Vital Signs BP 137/93 (BP Location: Left Arm)   Pulse 78   Temp 98.3 F (36.8 C) (Oral)   Resp 20   Ht 5\' 6"  (1.676 m)   SpO2 98%   Physical Exam  Constitutional: He is oriented to person, place, and time. He appears well-developed and well-nourished.  Talking on cell phone, NAD  HENT:  Head: Normocephalic and atraumatic.  Mouth/Throat: Oropharynx is clear and moist.  Eyes: Conjunctivae and EOM are normal. Pupils are  equal, round, and reactive to light.  Neck: Normal range of motion.  Cardiovascular: Normal rate, regular rhythm and normal heart sounds.   Pulmonary/Chest: Effort normal and breath sounds normal. No respiratory distress. He has no wheezes.  Abdominal: Soft. Bowel sounds are normal.  Musculoskeletal: Normal range of motion.  No calf swelling or tenderness, no overlying erythema or warmth to touch; no palpable cords  Neurological: He is alert and oriented to person, place, and time.  Skin: Skin is warm and dry.  Psychiatric: He has a normal mood and affect.  Nursing note and vitals reviewed.    ED Treatments / Results  Labs (all labs ordered are listed, but only abnormal results are displayed) Labs Reviewed  BASIC METABOLIC PANEL - Abnormal; Notable for the following:       Result Value   Glucose, Bld 100 (*)    All other components within normal limits  CBC - Abnormal; Notable for the following:    RBC 4.09 (*)    Hemoglobin 12.3 (*)    HCT 37.3 (*)    All other components within normal limits  I-STAT TROPOININ, ED  I-STAT TROPOININ, ED    EKG  EKG Interpretation  Date/Time:  Sunday January 08 2016 14:54:12 EDT Ventricular Rate:  97 PR Interval:  152 QRS Duration: 82 QT Interval:  360 QTC Calculation: 457 R Axis:   34 Text Interpretation:  Normal sinus rhythm Normal ECG No significant change since last tracing Confirmed by YAO  MD, DAVID ([id number]) on 01/08/2016 6:02:39 PM       Radiology Dg Chest 2 View  Result Date: 01/08/2016 CLINICAL DATA:  Chest pain EXAM: CHEST  2 VIEW COMPARISON:  11/28/2015 FINDINGS: The heart size and mediastinal contours are within normal limits. Both lungs are clear. The visualized skeletal structures are unremarkable. IMPRESSION: No active cardiopulmonary disease. Electronically Signed   By: Kathreen Devoid   On: 01/08/2016 15:32    Procedures Procedures (including critical care time)  Medications Ordered in ED Medications - No data to  display   Initial Impression / Assessment and Plan / ED Course  I have reviewed the triage vital signs and the nursing notes.  Pertinent labs & imaging results that were available during my care of the patient were reviewed by me and considered in my medical decision making (see chart for details).  Clinical Course   60 year old male here with chest pain. This is intermittent, lasting 1-2 seconds with each occurrence. Described as a "twinge". This is not exertional. No other associated symptoms.  No known cardiac history. EKG here is nonischemic. Labwork is reassuring. Chest x-ray is clear. During exam, patient did have another "episode" of his chest pain. He belched afterwards. Does report history of GERD and has run out of his home medications. Also reports eating spicy foods such as chicken wings recently. States he did have similar pain like this in the past related to GERD. Will give GI cocktail. He does have some noted risk factors (HTN, HLP, family history), therefore delta troponin obtained and remains negative.  Patient has been CP free since the GI cocktail.  Symptoms are atypical, possibly due to GERD. Feel ACS, PE, dissection, or other acute event less likely at this time.  Will d/c home with PCP follow-up.  Have refilled his protonix.  Discussed plan with patient, he acknowledged understanding and agreed with plan of care.  Return precautions given for new or worsening symptoms.  Final Clinical Impressions(s) / ED Diagnoses   Final diagnoses:  Chest pain, unspecified type    New Prescriptions Discharge Medication List as of 01/08/2016  8:53 PM       Larene Pickett, PA-C 01/08/16 [place], MD 01/09/16 [phone]

## 2016-01-08 NOTE — ED Notes (Signed)
Pt understood dc material. NAD noted. Script given at dc  

## 2016-01-08 NOTE — Discharge Instructions (Signed)
Take the prescribed medication as directed.  Watch how much spicy foods you are eating. Follow-up with your doctor as scheduled. Return to the ED for new or worsening symptoms-- return of pain, difficulty breathing, dizziness, weakness, etc.

## 2016-01-08 NOTE — ED Triage Notes (Signed)
Pt states today while in [place] he started having sharp left sided chest pains. Pt denies sob or other associated symptoms.

## 2016-01-31 ENCOUNTER — Encounter: Payer: Medicare HMO | Admitting: Internal Medicine

## 2016-02-14 ENCOUNTER — Ambulatory Visit (INDEPENDENT_AMBULATORY_CARE_PROVIDER_SITE_OTHER): Payer: Medicare HMO | Admitting: Internal Medicine

## 2016-02-14 ENCOUNTER — Encounter: Payer: Self-pay | Admitting: Internal Medicine

## 2016-02-14 VITALS — BP 130/90 | HR 84 | Temp 97.9°F | Ht 66.0 in | Wt 237.3 lb

## 2016-02-14 DIAGNOSIS — G4733 Obstructive sleep apnea (adult) (pediatric): Secondary | ICD-10-CM

## 2016-02-14 DIAGNOSIS — K219 Gastro-esophageal reflux disease without esophagitis: Secondary | ICD-10-CM | POA: Diagnosis not present

## 2016-02-14 DIAGNOSIS — R05 Cough: Secondary | ICD-10-CM | POA: Insufficient documentation

## 2016-02-14 DIAGNOSIS — I10 Essential (primary) hypertension: Secondary | ICD-10-CM | POA: Diagnosis not present

## 2016-02-14 DIAGNOSIS — R0602 Shortness of breath: Secondary | ICD-10-CM | POA: Diagnosis not present

## 2016-02-14 DIAGNOSIS — Z23 Encounter for immunization: Secondary | ICD-10-CM | POA: Diagnosis not present

## 2016-02-14 DIAGNOSIS — R058 Other specified cough: Secondary | ICD-10-CM

## 2016-02-14 DIAGNOSIS — Z79899 Other long term (current) drug therapy: Secondary | ICD-10-CM

## 2016-02-14 DIAGNOSIS — R059 Cough, unspecified: Secondary | ICD-10-CM

## 2016-02-14 HISTORY — DX: Cough, unspecified: R05.9

## 2016-02-14 MED ORDER — LOSARTAN POTASSIUM-HCTZ 50-12.5 MG PO TABS: 1.0000 | ORAL_TABLET | Freq: Every day | ORAL | 5 refills | Status: DC

## 2016-02-14 MED ORDER — HYDROCODONE-ACETAMINOPHEN 10-300 MG/15ML PO SOLN: 5.0000 mL | Freq: Two times a day (BID) | ORAL | 0 refills | Status: DC | PRN

## 2016-02-14 NOTE — Progress Notes (Signed)
   CC: Hypertension  HPI:  [name] [name] is a 60 y.o. man with PMHx as noted below who presents today for follow up of his hypertension.  HTN: BP well controlled at 130/90. He is taking Lisinopril-HCTZ 10-12.5 mg daily.   Cough: Reports a mostly dry cough for the last 2-3 weeks. He was evaluated in the emergency room last month and given a prescription for hydrocodone-acetaminophen cough syrup which does alleviate his cough. He has been taking this cough syrup twice daily. He denies any fevers or chills. He reports shortness of breath when he has a prolonged cough. Denies any pleuritic chest pain, rhinorrhea, sore throat.   SOB: He had PFTs done in April this year which showed minimal obstructive airway disease with mild response to bronchodilators. He reports albuterol does help his SOB when he uses it. Denies any smoking history.   OSA: He was supposed to undergo a sleep study in April this year but this did not get done. He reports he received a CPAP machine from a family member so he has been using it nightly.   GERD: Stable. Reports Protonix 40 mg daily alleviates his acid reflux symptoms.   Past Medical History:  Diagnosis Date  . Allergy   . Asthma   . Colon polyps    noted 10/2011 colonoscopy  . Diverticulosis    mild noted 10/2011  . GERD (gastroesophageal reflux disease)   . Hiatal hernia    small noted 10/2011  . Hiatal hernia    small  . HLD (hyperlipidemia)   . Hypertension   . Parasomnia    movement with sleep  . Pyrosis   . Sleep apnea    no cpap  . Varicose veins of both lower extremities with pain    right>left    Review of Systems:  All negative except per HPI  Physical Exam:  Vitals:   02/14/16 1521  BP: 130/90  Pulse: 84  Temp: 97.9 F (36.6 C)  TempSrc: Oral  SpO2: 100%  Weight: 237 lb 4.8 oz (107.6 kg)  Height: 5\' 6"  (1.676 m)   General: alert, cooperative, NAD HEENT: [place]/AT, EOMI, sclera anicteric, pharynx non-erythematous, mucus  membranes moist CV: RRR, no m/g/r Pulm: CTA bilaterally, breaths non-labored Ext: warm, no peripheral edema Neuro: alert and oriented x 3  Assessment & Plan:   See Encounters Tab for problem based charting.  Patient discussed with Dr. Daryll Drown

## 2016-02-14 NOTE — Patient Instructions (Signed)
General Instructions: - Stop Lisinopril-HCTZ - Start Losartan-HCTZ 50-12.5 mg daily for your blood pressure - Hopefully this will help your cough - Use albuterol as needed for shortness of breath  Please try to bring all your medicines next time. This will help [place] keep you safe from mistakes.   Progress Toward Treatment Goals:  Treatment Goal 12/14/2014  Blood pressure at goal    Self Care Goals & Plans:  Self Care Goal 02/14/2016  Manage my medications take my medicines as prescribed; bring my medications to every visit; refill my medications on time  Monitor my health keep track of my blood pressure  Eat healthy foods eat more vegetables; eat foods that are low in salt; eat baked foods instead of fried foods  Be physically active find an activity I enjoy  Meeting treatment goals -    No flowsheet data found.   Care Management & Community Referrals:  Referral 07/15/2014  Referrals made for care management support nutritionist  Referrals made to community resources none

## 2016-02-14 NOTE — Assessment & Plan Note (Signed)
Flu shot today 

## 2016-02-14 NOTE — Assessment & Plan Note (Signed)
I am suspicious that his Lisinopril may be the cause of his cough. He did note some mucus production, but denied any other symptoms to point towards infection. Since the cough syrup is providing him some relief will have him switch to Losartan-HCTZ combo pill and use Zolvit cough syrup as needed. I advised him this would be a one time refill for the Zolvit.

## 2016-02-14 NOTE — Assessment & Plan Note (Signed)
He is using a CPAP machine at home. I did advise him that since he did not undergo official CPAP titration I was concerned about the settings he was using, but he does not want to undergo the sleep study. I recommended that if his home CPAP machine does break he will need to undergo the sleep study again and he understands.

## 2016-02-14 NOTE — Assessment & Plan Note (Signed)
Stable. Symptoms controlled with Protonix 40 mg daily.

## 2016-02-14 NOTE — Assessment & Plan Note (Signed)
BP in good control. Given he has had a persistent mostly dry cough for the last 2-3 weeks I will switch him to Losartan-HCTZ combo pill as Lisinopril could be the etiology for his cough. Will have him start Losartan-HCTZ 50-12.5 mg daily.

## 2016-02-15 ENCOUNTER — Encounter: Payer: Self-pay | Admitting: Licensed Clinical Social Worker

## 2016-02-15 NOTE — Progress Notes (Signed)
CSW received PCS request on behalf of [name].  During patient's [facility] visit 02/14/16, pt indicated he was independent with ADL's.  CSW forwarded request to PCP for determination.

## 2016-02-20 NOTE — Progress Notes (Signed)
Internal Medicine Clinic Attending  Case discussed with Dr. Rivet at the time of the visit.  We reviewed the resident's history and exam and pertinent patient test results.  I agree with the assessment, diagnosis, and plan of care documented in the resident's note.  

## 2016-02-22 ENCOUNTER — Telehealth: Payer: Self-pay

## 2016-02-22 NOTE — Telephone Encounter (Signed)
Pt's caregiver calls and states since he started new BP med he has a rash on his abd, back and legs. He has stopped taking it and will be seen Thursday am in [facility]

## 2016-02-22 NOTE — Telephone Encounter (Signed)
Please call pt back regarding meds.  

## 2016-02-23 ENCOUNTER — Ambulatory Visit (INDEPENDENT_AMBULATORY_CARE_PROVIDER_SITE_OTHER): Payer: Medicare HMO | Admitting: Internal Medicine

## 2016-02-23 ENCOUNTER — Encounter: Payer: Self-pay | Admitting: Internal Medicine

## 2016-02-23 DIAGNOSIS — I10 Essential (primary) hypertension: Secondary | ICD-10-CM

## 2016-02-23 DIAGNOSIS — Z79899 Other long term (current) drug therapy: Secondary | ICD-10-CM

## 2016-02-23 DIAGNOSIS — R21 Rash and other nonspecific skin eruption: Secondary | ICD-10-CM | POA: Diagnosis not present

## 2016-02-23 MED ORDER — HYDROCHLOROTHIAZIDE 25 MG PO TABS: 25.0000 mg | ORAL_TABLET | Freq: Every day | ORAL | 0 refills | Status: DC

## 2016-02-23 NOTE — Patient Instructions (Addendum)
It was a pleasure to meet you today [name]  STOP taking losartan- HCTZ  START taking HCTZ 25 mg daily   Schedule a follow up appointment for 2 weeks   DASH Eating Plan DASH stands for "Dietary Approaches to Stop Hypertension." The DASH eating plan is a healthy eating plan that has been shown to reduce high blood pressure (hypertension). Additional health benefits may include reducing the risk of type 2 diabetes mellitus, heart disease, and stroke. The DASH eating plan may also help with weight loss. What do I need to know about the DASH eating plan? For the DASH eating plan, you will follow these general guidelines:  Choose foods with less than 150 milligrams of sodium per serving (as listed on the food label).  Use salt-free seasonings or herbs instead of table salt or sea salt.  Check with your health care provider or pharmacist before using salt substitutes.  Eat lower-sodium products. These are often labeled as "low-sodium" or "no salt added."  Eat fresh foods. Avoid eating a lot of canned foods.  Eat more vegetables, fruits, and low-fat dairy products.  Choose whole grains. Look for the word "whole" as the first word in the ingredient list.  Choose fish and skinless chicken or [place] more often than red meat. Limit fish, poultry, and meat to 6 oz (170 g) each day.  Limit sweets, desserts, sugars, and sugary drinks.  Choose heart-healthy fats.  Eat more home-cooked food and less restaurant, buffet, and fast food.  Limit fried foods.  Do not fry foods. Cook foods using methods such as baking, boiling, grilling, and broiling instead.  When eating at a restaurant, ask that your food be prepared with less salt, or no salt if possible. What foods can I eat? Seek help from a dietitian for individual calorie needs. Grains  Whole grain or whole wheat bread. Brown rice. Whole grain or whole wheat pasta. Quinoa, bulgur, and whole grain cereals. Low-sodium cereals. Corn or  whole wheat flour tortillas. Whole grain cornbread. Whole grain crackers. Low-sodium crackers. Vegetables  Fresh or frozen vegetables (raw, steamed, roasted, or grilled). Low-sodium or reduced-sodium tomato and vegetable juices. Low-sodium or reduced-sodium tomato sauce and paste. Low-sodium or reduced-sodium canned vegetables. Fruits  All fresh, canned (in natural juice), or frozen fruits. Meat and Other Protein Products  Ground beef (85% or leaner), grass-fed beef, or beef trimmed of fat. Skinless chicken or [place]. Ground chicken or [place]. Pork trimmed of fat. All fish and seafood. Eggs. Dried beans, peas, or lentils. Unsalted nuts and seeds. Unsalted canned beans. Dairy  Low-fat dairy products, such as skim or 1% milk, 2% or reduced-fat cheeses, low-fat ricotta or cottage cheese, or plain low-fat yogurt. Low-sodium or reduced-sodium cheeses. Fats and Oils  Tub margarines without trans fats. Light or reduced-fat mayonnaise and salad dressings (reduced sodium). Avocado. Safflower, olive, or canola oils. Natural peanut or almond butter. Other  Unsalted popcorn and pretzels. The items listed above may not be a complete list of recommended foods or beverages. Contact your dietitian for more options.  What foods are not recommended? Grains  White bread. White pasta. White rice. Refined cornbread. Bagels and croissants. Crackers that contain trans fat. Vegetables  Creamed or fried vegetables. Vegetables in a cheese sauce. Regular canned vegetables. Regular canned tomato sauce and paste. Regular tomato and vegetable juices. Fruits  Canned fruit in light or heavy syrup. Fruit juice. Meat and Other Protein Products  Fatty cuts of meat. Ribs, chicken wings, bacon, sausage, bologna, salami,  chitterlings, fatback, hot dogs, bratwurst, and packaged luncheon meats. Salted nuts and seeds. Canned beans with salt. Dairy  Whole or 2% milk, cream, half-and-half, and cream cheese. Whole-fat or sweetened  yogurt. Full-fat cheeses or blue cheese. Nondairy creamers and whipped toppings. Processed cheese, cheese spreads, or cheese curds. Condiments  Onion and garlic salt, seasoned salt, table salt, and sea salt. Canned and packaged gravies. Worcestershire sauce. Tartar sauce. Barbecue sauce. Teriyaki sauce. Soy sauce, including reduced sodium. Steak sauce. Fish sauce. Oyster sauce. Cocktail sauce. Horseradish. Ketchup and mustard. Meat flavorings and tenderizers. Bouillon cubes. Hot sauce. Tabasco sauce. Marinades. Taco seasonings. Relishes. Fats and Oils  Butter, stick margarine, lard, shortening, ghee, and bacon fat. Coconut, palm kernel, or palm oils. Regular salad dressings. Other  Pickles and olives. Salted popcorn and pretzels. The items listed above may not be a complete list of foods and beverages to avoid. Contact your dietitian for more information.  Where can I find more information? National Heart, Lung, and Blood Institute: travelstabloid.com This information is not intended to replace advice given to you by your health care provider. Make sure you discuss any questions you have with your health care provider. Document Released: 03/01/2011 Document Revised: 08/18/2015 Document Reviewed: 01/14/2013 Elsevier Interactive Patient Education  2017 [employer]. One

## 2016-02-23 NOTE — Progress Notes (Signed)
   CC: Rash   HPI: [name] [name] is a 60 y.o. with past medical history as outlined below who presents to clinic for acute complaint of rash since beginning his new blood pressure medication. He was seen in clinic one week ago with complaint of cough which was thought to be related to lisinopril therapy. His lisinopril- HCTZ combination pill was switched to a losartan- HCTZ combination pill at that time. He has developed an itchy bumpy rash on his abdomen since switching to the new medication. He also switched laundry detergents last week. The cough is still present but has improved somewhat, he is using alka- seltzer for cold and this has helped his symptoms. He denies chest pain, shortness of breath, muscle ache, or new leg swelling.   Please see problem list for status of the pt's chronic medical problems.  Past Medical History:  Diagnosis Date  . Allergy   . Asthma   . Colon polyps    noted 10/2011 colonoscopy  . Diverticulosis    mild noted 10/2011  . GERD (gastroesophageal reflux disease)   . Hiatal hernia    small noted 10/2011  . Hiatal hernia    small  . HLD (hyperlipidemia)   . Hypertension   . Parasomnia    movement with sleep  . Pyrosis   . Sleep apnea    no cpap  . Varicose veins of both lower extremities with pain    right>left    Review of Systems:  Please see each problem below for a pertinent review of systems. ROS  Physical Exam:  Vitals:   02/23/16 0949  BP: 140/88  Pulse: 88  Temp: 98.1 F (36.7 C)  TempSrc: Oral  SpO2: 98%  Weight: 237 lb 3.2 oz (107.6 kg)  Height: 5\' 6"  (1.676 m)   Physical Exam  Constitutional: He is oriented to person, place, and time. He appears well-developed and well-nourished. No distress.  Cardiovascular: Normal rate and regular rhythm.   No murmur heard. Pulmonary/Chest: Effort normal. No respiratory distress. He has no wheezes. He has no rales.  Abdominal: Soft. He exhibits no distension. There is no tenderness.    Neurological: He is alert and oriented to person, place, and time.  Skin: Skin is warm and dry. He is not diaphoretic. No erythema.  Papular rash over lower abdomen     Assessment & Plan:   See Encounters Tab for problem based charting.  Patient seen with Dr. Dareen Piano

## 2016-02-27 NOTE — Assessment & Plan Note (Signed)
He was seen in clinic one week ago with complaint of cough which was thought to be related to lisinopril therapy. His lisinopril- HCTZ combination pill was switched to a losartan- HCTZ combination pill at that time. He has developed an itchy bumpy rash on his abdomen since switching to the new medication. He also switched laundry detergents last week. The cough is still present but has improved somewhat, he is using alka- seltzer for cold and this has helped his symptoms. He denies chest pain, shortness of breath, muscle ache, or new leg swelling.   Today his blood pressure is 140/88. This new rash could be related to his blood pressure medication or the switch in his laundry detergent.   Discontinues Losartan- HCTZ 50-12.5 mg daily  Prescribed HCTZ 25 mg daily  Advised to switch back to regular laundry detergent  Follow up in 2 weeks for blood pressure recheck

## 2016-02-29 NOTE — Progress Notes (Signed)
Internal Medicine Clinic Attending  I saw and evaluated the patient.  I personally confirmed the key portions of the history and exam documented by Dr. Blum and I reviewed pertinent patient test results.  The assessment, diagnosis, and plan were formulated together and I agree with the documentation in the resident's note. 

## 2016-04-06 ENCOUNTER — Other Ambulatory Visit: Payer: Self-pay | Admitting: Internal Medicine

## 2016-04-08 NOTE — Telephone Encounter (Signed)
Needs appointment for BP recheck

## 2016-04-09 ENCOUNTER — Other Ambulatory Visit: Payer: Self-pay | Admitting: Internal Medicine

## 2016-04-10 NOTE — Telephone Encounter (Signed)
Message sent to front office to schedule pt an appt. 

## 2016-04-18 ENCOUNTER — Encounter (HOSPITAL_COMMUNITY): Payer: Self-pay | Admitting: *Deleted

## 2016-04-18 ENCOUNTER — Ambulatory Visit (HOSPITAL_COMMUNITY)
Admission: EM | Admit: 2016-04-18 | Discharge: 2016-04-18 | Disposition: A | Discharge status: Home / Self Care | Payer: Medicare HMO | Attending: Family Medicine | Admitting: Family Medicine

## 2016-04-18 DIAGNOSIS — R0789 Other chest pain: Secondary | ICD-10-CM

## 2016-04-18 DIAGNOSIS — J069 Acute upper respiratory infection, unspecified: Secondary | ICD-10-CM

## 2016-04-18 DIAGNOSIS — B9789 Other viral agents as the cause of diseases classified elsewhere: Secondary | ICD-10-CM | POA: Diagnosis not present

## 2016-04-18 MED ORDER — NAPROXEN 375 MG PO TABS: 375.0000 mg | ORAL_TABLET | Freq: Two times a day (BID) | ORAL | 0 refills | Status: DC

## 2016-04-18 MED ORDER — BENZONATATE 100 MG PO CAPS: 100.0000 mg | ORAL_CAPSULE | Freq: Three times a day (TID) | ORAL | 0 refills | Status: DC

## 2016-04-18 NOTE — ED Provider Notes (Signed)
CSN: [id number]     Arrival date & time 04/18/16  [id number] History   None    Chief Complaint  Patient presents with  . Cough  . Pleurisy  . Nasal Congestion   (Consider location/radiation/quality/duration/timing/severity/associated sxs/prior Treatment) Patient presents and c/o left sided chest pain this am.  He c/o right lateral chest wall pain and cramping.  He c/o nasal congestion.  He is concerned about the chest pain he is having .   The history is provided by the patient.  Cough  Cough characteristics:  Non-productive Severity:  Mild Onset quality:  Gradual Duration:  1 day Timing:  Constant Progression:  Worsening Chronicity:  New Smoker: no   Relieved by:  Nothing Worsened by:  Nothing Ineffective treatments:  None tried Associated symptoms: chest pain, chills and rhinorrhea     Past Medical History:  Diagnosis Date  . Allergy   . Asthma   . Colon polyps    noted 10/2011 colonoscopy  . Diverticulosis    mild noted 10/2011  . GERD (gastroesophageal reflux disease)   . Hiatal hernia    small noted 10/2011  . Hiatal hernia    small  . HLD (hyperlipidemia)   . Hypertension   . Parasomnia    movement with sleep  . Pyrosis   . Sleep apnea    no cpap  . Varicose veins of both lower extremities with pain    right>left   Past Surgical History:  Procedure Laterality Date  . ABDOMINAL SURGERY  3 years ago   pt unknown what it was,states "stomach with tape on it"  . COLONOSCOPY  2013  . POLYPECTOMY     Family History  Problem Relation Age of Onset  . Cancer Mother     died at age 87 uterine cancer   . Diabetes Father   . Crohn's disease Son   . Hypertension Other     uncle  . Colon cancer Neg Hx   . Colon polyps Neg Hx   . Esophageal cancer Neg Hx   . Rectal cancer Neg Hx   . Stomach cancer Neg Hx    Social History  Substance Use Topics  . Smoking status: Never Smoker  . Smokeless tobacco: Never Used  . Alcohol use 1.2 oz/week    2 Cans of beer per  week     Comment: Beer sometimes occasionally.    Review of Systems  Constitutional: Positive for chills.  HENT: Positive for rhinorrhea.   Eyes: Negative.   Respiratory: Positive for cough.   Cardiovascular: Positive for chest pain.  Gastrointestinal: Negative.   Endocrine: Negative.   Genitourinary: Negative.   Musculoskeletal: Negative.   Skin: Negative.   Allergic/Immunologic: Negative.   Neurological: Negative.   Hematological: Negative.   Psychiatric/Behavioral: Negative.     Allergies  Patient has no known allergies.  Home Medications   Prior to Admission medications   Medication Sig Start Date End Date Taking? Authorizing Provider  albuterol (PROVENTIL HFA) 108 (90 Base) MCG/ACT inhaler Inhale 2 puffs into the lungs every 6 (six) hours as needed for wheezing. 07/12/15 09/08/17 Yes Carly Montey Hora, MD  aspirin EC 81 MG tablet Take 81 mg by mouth every 6 (six) hours as needed (takes sometimes).    Yes Historical Provider, MD  cetirizine (ZYRTEC) 10 MG tablet Take 1 tablet (10 mg total) by mouth daily. 04/07/15  Yes Carly Montey Hora, MD  hydrochlorothiazide (HYDRODIURIL) 25 MG tablet TAKE ONE TABLET BY MOUTH ONCE DAILY 04/08/16  Yes Juliet Rude, MD  pantoprazole (PROTONIX) 20 MG tablet Take 2 tablets (40 mg total) by mouth daily. 01/08/16  Yes Larene Pickett, PA-C  benzonatate (TESSALON) 100 MG capsule Take 1 capsule (100 mg total) by mouth every 8 (eight) hours. 04/18/16   Lysbeth Penner, FNP  naproxen (NAPROSYN) 375 MG tablet Take 1 tablet (375 mg total) by mouth 2 (two) times daily. 04/18/16   Lysbeth Penner, FNP  polyethylene glycol [facility] / Floria Raveling) packet Take 17 g by mouth daily as needed for moderate constipation. Reported on 07/12/2015 Patient not taking: Reported on 01/08/2016 07/12/15   Sindy Guadeloupe Rivet, MD  sodium chloride (OCEAN) 0.65 % SOLN nasal spray Place 2 sprays into both nostrils as needed for congestion. Patient not taking: Reported on 01/08/2016 07/12/15    Juliet Rude, MD   Meds Ordered and Administered this Visit  Medications - No data to display  BP 118/80 (BP Location: Right Arm)   Pulse 89   Temp 98.8 F (37.1 C) (Oral)   Resp 17   SpO2 100%  No data found.   Physical Exam  Constitutional: He appears well-developed and well-nourished.  HENT:  Head: Normocephalic and atraumatic.  Right Ear: External ear normal.  Left Ear: External ear normal.  Mouth/Throat: Oropharynx is clear and moist.  Eyes: Conjunctivae and EOM are normal. Pupils are equal, round, and reactive to light.  Neck: Normal range of motion. Neck supple.  Cardiovascular: Normal rate, regular rhythm and normal heart sounds.   Pulmonary/Chest: Effort normal and breath sounds normal.  Abdominal: Soft. Bowel sounds are normal.  Nursing note and vitals reviewed.   Urgent Care Course     Procedures (including critical care time)  Labs Review Labs Reviewed - No data to display  Imaging Review No results found.   Visual Acuity Review  Right Eye Distance:   Left Eye Distance:   Bilateral Distance:    Right Eye Near:   Left Eye Near:    Bilateral Near:      EKG shows ST w/o acute ST-T changes    MDM   1. Viral URI with cough   2. Chest wall pain    Naprosyn Tessalon Work note  Push po fluids, rest, tylenol and motrin otc prn as directed for fever, arthralgias, and myalgias.  Follow up prn if sx's continue or persist.    Lysbeth Penner, FNP 04/18/16 [place], [place] 04/18/16 [phone]

## 2016-06-25 ENCOUNTER — Telehealth: Payer: Self-pay | Admitting: Internal Medicine

## 2016-06-25 NOTE — Telephone Encounter (Signed)
APT. REMINDER CALL, LMTCB °

## 2016-06-26 ENCOUNTER — Ambulatory Visit (INDEPENDENT_AMBULATORY_CARE_PROVIDER_SITE_OTHER): Payer: Medicare HMO | Admitting: Internal Medicine

## 2016-06-26 ENCOUNTER — Encounter: Payer: Self-pay | Admitting: Internal Medicine

## 2016-06-26 ENCOUNTER — Encounter (INDEPENDENT_AMBULATORY_CARE_PROVIDER_SITE_OTHER): Payer: Self-pay

## 2016-06-26 VITALS — BP 133/86 | HR 82 | Temp 98.0°F | Ht 66.0 in | Wt 244.3 lb

## 2016-06-26 DIAGNOSIS — E669 Obesity, unspecified: Secondary | ICD-10-CM

## 2016-06-26 DIAGNOSIS — D649 Anemia, unspecified: Secondary | ICD-10-CM

## 2016-06-26 DIAGNOSIS — J302 Other seasonal allergic rhinitis: Secondary | ICD-10-CM | POA: Diagnosis not present

## 2016-06-26 DIAGNOSIS — Z79899 Other long term (current) drug therapy: Secondary | ICD-10-CM

## 2016-06-26 DIAGNOSIS — I10 Essential (primary) hypertension: Secondary | ICD-10-CM | POA: Diagnosis not present

## 2016-06-26 DIAGNOSIS — K219 Gastro-esophageal reflux disease without esophagitis: Secondary | ICD-10-CM

## 2016-06-26 DIAGNOSIS — I8390 Asymptomatic varicose veins of unspecified lower extremity: Secondary | ICD-10-CM

## 2016-06-26 DIAGNOSIS — K5900 Constipation, unspecified: Secondary | ICD-10-CM | POA: Diagnosis not present

## 2016-06-26 DIAGNOSIS — R931 Abnormal findings on diagnostic imaging of heart and coronary circulation: Secondary | ICD-10-CM | POA: Diagnosis not present

## 2016-06-26 DIAGNOSIS — R042 Hemoptysis: Secondary | ICD-10-CM | POA: Diagnosis not present

## 2016-06-26 DIAGNOSIS — R0602 Shortness of breath: Secondary | ICD-10-CM

## 2016-06-26 DIAGNOSIS — Z86010 Personal history of colonic polyps: Secondary | ICD-10-CM | POA: Diagnosis not present

## 2016-06-26 DIAGNOSIS — J301 Allergic rhinitis due to pollen: Secondary | ICD-10-CM

## 2016-06-26 DIAGNOSIS — I8393 Asymptomatic varicose veins of bilateral lower extremities: Secondary | ICD-10-CM

## 2016-06-26 MED ORDER — CETIRIZINE HCL 10 MG PO TABS: 10.0000 mg | ORAL_TABLET | Freq: Every day | ORAL | 3 refills | Status: DC

## 2016-06-26 MED ORDER — PANTOPRAZOLE SODIUM 40 MG PO TBEC: 40.0000 mg | DELAYED_RELEASE_TABLET | Freq: Every day | ORAL | 5 refills | Status: DC

## 2016-06-26 NOTE — Assessment & Plan Note (Signed)
His PFTs from April 2017 were unimpressive. They showed only minimal airway obstruction with a very slight response to bronchodilators. Patient is getting short of breath with walking the length of the hallway still. He notes that albuterol does relieve his shortness of breath. It is possible he has some underlying lung disease that could explain both his hemoptysis and shortness of breath. Will obtain a CT chest with contrast to look for an underlying etiology.

## 2016-06-26 NOTE — Progress Notes (Signed)
CC: Hypertension follow up  HPI:  [name] is a 61 y.o. man with PMHx as noted below who presents today for follow up of his hypertension.  HTN: BP is well-controlled at 133/86. His losartan was discontinued at his last visit due to a rash. He is taking HCTZ 25 mg daily.  Normocytic Anemia: He has a history of a low normal hemoglobin in the low 12 range. He has never had iron studies. He does have a history of a colonic adenoma but his last colonoscopy in March 2017 was negative for any polyps. He has had an upper endoscopy in August 2013 which was negative for any abnormalities. He denies any blood in the stool or dark tarry stool. He does have a history of intermittent hemoptysis but this has been infrequent per his report. However, he did know a recent episode last week and then 2 days ago when he had a teaspoon amount of hemoptysis. He denies any dizziness or lightheadedness.  Hemoptysis: He has a two-year history of intermittent hemoptysis. Patient has refused CT chest in the past. Today he notes he had an episode of hemoptysis last week and then 2 days ago. He describes the amount as a teaspoon full. He has reported to me in the past as having almost a cup-full of hemoptysis. He reports feeling short of breath when walking the distance of the long hallway.   Shortness of breath: He reports feeling short of breath when walking the distance of the hallway. He notes that albuterol does help his shortness of breath. He underwent PFTs in April 2017 which were unimpressive, showing possible mild airway obstruction with a slight response to bronchodilators. He denies any associated wheezing, chest pain, or leg swelling.  Constipation: Reports he has been having constipation. He has tried MiraLAX in the past with relief but has not used this lately. He reports an occasional left-sided abdominal pain but this is relieved once he has a bowel movement. He denies any blood in the  stool.  GERD: Reports his acid reflux is well-controlled while on Protonix 40 mg daily.  Varicose Veins: Reports having right leg pain for the last few days. He states the pain only lasts a few seconds and then will go away. Pain is exacerbated by walking. He does not feel his legs are more swollen than usual. He has not been wearing his compression stockings.  Allergic rhinitis: He has not been taking Zyrtec. He notes feeling congested, especially with the recent pollen increase. Denies itchy, watery eyes.  Past Medical History:  Diagnosis Date  . Allergy   . Asthma   . Colon polyps    noted 10/2011 colonoscopy  . Diverticulosis    mild noted 10/2011  . GERD (gastroesophageal reflux disease)   . Hiatal hernia    small noted 10/2011  . Hiatal hernia    small  . HLD (hyperlipidemia)   . Hypertension   . Parasomnia    movement with sleep  . Pyrosis   . Sleep apnea    no cpap  . Varicose veins of both lower extremities with pain    right>left    Review of Systems:   General: Denies fever, chills, night sweats, changes in weight, changes in appetite HEENT: Denies headaches, ear pain, changes in vision, rhinorrhea, sore throat CV: Denies palpitations, orthopnea Pulm: See HPI GI: Denies nausea, vomiting, diarrhea GU: Denies dysuria, hematuria, frequency Msk: Denies muscle cramps, joint pains Neuro: Denies weakness, numbness, tingling Skin: Denies  rashes, bruising Psych: Denies depression, anxiety, hallucinations  Physical Exam:  Vitals:   06/26/16 1534  BP: 133/86  Pulse: 82  Temp: 98 F (36.7 C)  TempSrc: Oral  SpO2: 100%  Weight: 244 lb 4.8 oz (110.8 kg)  Height: 5\' 6"  (1.676 m)   General: Obese man sitting up, NAD HEENT: [place]/AT, EOMI, sclera anicteric, mucus membranes moist CV: RRR, no m/g/r Pulm: CTA bilaterally, breaths non-labored Abd: BS+, soft, obese, non-tender Ext: warm, no peripheral edema. Bilateral varicose veins are palpated. He has mild tenderness to  palpation. No erythema present.  Neuro: alert and oriented x 3  Assessment & Plan:   See Encounters Tab for problem based charting.  Patient discussed with Dr. Eppie Gibson

## 2016-06-26 NOTE — Assessment & Plan Note (Signed)
He reported he had an additional 2 episodes of hemoptysis within the past 2 weeks. This is concerning as patient has had intermittent hemoptysis for the last 2 years without an explanation. He has described in the past coughing up as much as a cup full of blood. Additionally, I reviewed his CXR from Oct 2017 with Dr. Eppie Gibson and there may be a lesion in the left lower lobe that could represent an AVM (circular lesion with tail seen). Will have him go for a CT chest with contrast, patient is agreeable. If CT is negative will need to undergo bronchoscopy.

## 2016-06-26 NOTE — Assessment & Plan Note (Signed)
Refilled his Zyrtec. Advised him to restart this medication.

## 2016-06-26 NOTE — Assessment & Plan Note (Signed)
Advised to use MiraLAX as needed for constipation. Will follow-up how this is doing at his next visit.

## 2016-06-26 NOTE — Progress Notes (Signed)
Case discussed with Dr. Arcelia Jew at the time of the visit. We reviewed the resident's history and exam and pertinent patient test results. I agree with the assessment, diagnosis, and plan of care documented in the resident's note.  Previous CXR was reviewed and there is a possible LLL circular lesion overlying the descending thoracic aorta and spine that has a posterior tail.  Given the history this is concerning for a possible AVM.  We are obtaining a CT scan of the chest with contrast to further evaluation.  If CT scan negative he will require an endoscopic airway examination to rule out a proximal endobronchial lesion.

## 2016-06-26 NOTE — Patient Instructions (Signed)
General Instructions: - We will get a CT chest to further evaluate the blood you are coughing up - Continue albuterol as needed - Make sure to try Miralax for your constipation  Please bring your medicines with you each time you come to clinic.  Medicines may include prescription medications, over-the-counter medications, herbal remedies, eye drops, vitamins, or other pills.   Progress Toward Treatment Goals:  Treatment Goal 12/14/2014  Blood pressure at goal    Self Care Goals & Plans:  Self Care Goal 06/26/2016  Manage my medications take my medicines as prescribed; bring my medications to every visit; refill my medications on time  Monitor my health keep track of my blood pressure  Eat healthy foods eat more vegetables; eat foods that are low in salt; eat baked foods instead of fried foods  Be physically active find an activity I enjoy  Meeting treatment goals -    No flowsheet data found.   Care Management & Community Referrals:  Referral 07/15/2014  Referrals made for care management support nutritionist  Referrals made to community resources none

## 2016-06-26 NOTE — Assessment & Plan Note (Signed)
Stable. Refilled his Protonix 40 mg daily.

## 2016-06-26 NOTE — Assessment & Plan Note (Signed)
BP well controlled. Continue HCTZ 25 mg daily.

## 2016-06-26 NOTE — Assessment & Plan Note (Signed)
His leg pains are most likely related to his varicose veins. Patient has not noticed any changes in his leg appearance and feels these pains are similar as the past. No evidence of significant swelling or erythema on exam to suggest a DVT. All of his vitals are stable. Recommended to use his compression stockings.

## 2016-06-26 NOTE — Assessment & Plan Note (Signed)
Will obtain a ferritin today to determine if he is iron deficient. His anemia could potentially be related to his hemoptysis. He had a normal colonoscopy in March 2017 and normal EGD in 2013.

## 2016-06-27 LAB — CBC
Hematocrit: 37.4 % — ABNORMAL LOW (ref 37.5–51.0)
Hemoglobin: 12.7 g/dL — ABNORMAL LOW (ref 13.0–17.7)
MCH: 29.7 pg (ref 26.6–33.0)
MCHC: 34 g/dL (ref 31.5–35.7)
MCV: 88 fL (ref 79–97)
PLATELETS: 227 10*3/uL (ref 150–379)
RBC: 4.27 x10E6/uL (ref 4.14–5.80)
RDW: 13.8 % (ref 12.3–15.4)
WBC: 5.6 10*3/uL (ref 3.4–10.8)

## 2016-06-27 LAB — FERRITIN: FERRITIN: 166 ng/mL (ref 30–400)

## 2016-07-19 ENCOUNTER — Encounter: Payer: Self-pay | Admitting: Internal Medicine

## 2016-07-19 ENCOUNTER — Ambulatory Visit (INDEPENDENT_AMBULATORY_CARE_PROVIDER_SITE_OTHER): Payer: Medicare HMO | Admitting: Internal Medicine

## 2016-07-19 VITALS — BP 131/91 | HR 93 | Temp 98.2°F | Wt 244.0 lb

## 2016-07-19 DIAGNOSIS — E669 Obesity, unspecified: Secondary | ICD-10-CM | POA: Diagnosis not present

## 2016-07-19 DIAGNOSIS — M79652 Pain in left thigh: Secondary | ICD-10-CM

## 2016-07-19 DIAGNOSIS — R42 Dizziness and giddiness: Secondary | ICD-10-CM | POA: Diagnosis not present

## 2016-07-19 DIAGNOSIS — R269 Unspecified abnormalities of gait and mobility: Secondary | ICD-10-CM | POA: Insufficient documentation

## 2016-07-19 DIAGNOSIS — R2681 Unsteadiness on feet: Secondary | ICD-10-CM

## 2016-07-19 DIAGNOSIS — R260 Ataxic gait: Secondary | ICD-10-CM

## 2016-07-19 LAB — POCT GLYCOSYLATED HEMOGLOBIN (HGB A1C): Hemoglobin A1C: 5.6

## 2016-07-19 LAB — GLUCOSE, CAPILLARY: Glucose-Capillary: 87 mg/dL (ref 65–99)

## 2016-07-19 MED ORDER — GABAPENTIN 300 MG PO CAPS: 300.0000 mg | ORAL_CAPSULE | Freq: Three times a day (TID) | ORAL | 0 refills | Status: DC

## 2016-07-19 NOTE — Patient Instructions (Signed)
General Instructions: - We will get an MRI of your brain to assess for potential causes of why you are having staggering - Start Gabapentin 300 mg at bedtime. Can gradually increase to 300 mg twice daily next week and then 300 mg three times daily the following week. - Check A1c today to evaluate for diabetes - Please get CT chest on May 3rd for coughing up blood   Please bring your medicines with you each time you come to clinic.  Medicines may include prescription medications, over-the-counter medications, herbal remedies, eye drops, vitamins, or other pills.   Progress Toward Treatment Goals:  Treatment Goal 12/14/2014  Blood pressure at goal    Self Care Goals & Plans:  Self Care Goal 06/26/2016  Manage my medications take my medicines as prescribed; bring my medications to every visit; refill my medications on time  Monitor my health keep track of my blood pressure  Eat healthy foods eat more vegetables; eat foods that are low in salt; eat baked foods instead of fried foods  Be physically active find an activity I enjoy  Meeting treatment goals -    No flowsheet data found.   Care Management & Community Referrals:  Referral 07/15/2014  Referrals made for care management support nutritionist  Referrals made to community resources none

## 2016-07-19 NOTE — Progress Notes (Signed)
   CC: Dizziness, losing balance   HPI:  [name] [name] is a 61 y.o. man with PMHx as noted below who presents today for evaluation of dizziness.   He reports 2 weeks ago he noticed he was losing his balance and felt dizzy. He notes he will stagger to both sides when walking. He feels that his left thigh is weak and is having trouble walking. His girlfriend is with him today for the visit and notes that she sees the change in his gait as well. He denies any falls. He reports his left thigh is painful along the lateral side and this started about 2 weeks ago. He has a difficult time describing the pain but states it hurts the most at night while lying down. He denies any trauma to the leg. He denies any weakness in his arms, changes in speech, changes in vision, or tingling/numbness. He has been taking a baby aspirin which helps the pain.   His girlfriend states that he is illiterate and is unable to read his mail or his medications. She has been helping him with these things when she can. She notes he also needs help with bathing and dressing, especially ever since he developed his gait difficulties.  Past Medical History:  Diagnosis Date  . Allergy   . Asthma   . Colon polyps    noted 10/2011 colonoscopy  . Diverticulosis    mild noted 10/2011  . GERD (gastroesophageal reflux disease)   . Hiatal hernia    small noted 10/2011  . Hiatal hernia    small  . HLD (hyperlipidemia)   . Hypertension   . Parasomnia    movement with sleep  . Pyrosis   . Sleep apnea    no cpap  . Varicose veins of both lower extremities with pain    right>left    Review of Systems:   All negative except per HPI  Physical Exam:  Vitals:   07/19/16 1559  BP: (!) 131/91  Pulse: 93  Temp: 98.2 F (36.8 C)  TempSrc: Oral  SpO2: 98%  Weight: 244 lb (110.7 kg)   General: Obese man in NAD HEENT: EOMI, PERRLA, sclera anicteric, mucus membranes moist CV: RRR, no m/g/r Pulm: CTA bilaterally, breaths  non-labored Ext: no peripheral edema, ROM full in all extremities  Neuro: alert and oriented x 3. Smile symmetric. Tongue midline. Shoulder shrug intact. Strength 5/5 in upper and lower extremities bilaterally. Sensation symmetric and intact to light touch and pinprick. Gait is abnormal with left leg bending more than the right and patient is unbalanced, does not favor one side.   Assessment & Plan:   See Encounters Tab for problem based charting.  Patient discussed with Dr. Lynnae January

## 2016-07-20 DIAGNOSIS — M79652 Pain in left thigh: Secondary | ICD-10-CM | POA: Insufficient documentation

## 2016-07-20 NOTE — Assessment & Plan Note (Signed)
Patient reports developing dizziness and staggering of his gait about 2 weeks ago. His neuro exam is significant for a gait disturbance which seems to favor the left leg and he appears unbalanced. He had good strength in his legs when seated. This is certainly concerning for a posterior circulation stroke or cerebellar pathology. Will obtain an MRI brain without contrast to evaluate for possible stroke. His only risk factor for stroke is HTN. He has been taking a baby aspirin daily. Based on the patient's and patient's girlfriend's descriptions, he is having difficulty with ADLs at home (bathing and dressing) and personal care attendant services are appropriate. I filled out this paperwork for him. Will also have him referred to PT to work on gait stability. An A1c was checked as patient had noted increased urination and polyuria. His A1c was just below prediabetes range and we discussed the importance of weight loss and lifestyle modifications.

## 2016-07-20 NOTE — Assessment & Plan Note (Signed)
Difficult to discern whether he is having weakness or pain in his left leg that could be contributing to his gait instability. Given his pain worsens when lying on the left side this could represent meralgia paresthetica. Will have him try Gabapentin 300 mg TID. He was advised to start with 300 mg at bedtime for 1 week and then gradually increase to 300 mg twice daily for the second week, and then 300 mg TID.

## 2016-07-23 NOTE — Progress Notes (Signed)
Internal Medicine Clinic Attending  Case discussed with Dr. Rivet soon after the resident saw the patient.  We reviewed the resident's history and exam and pertinent patient test results.  I agree with the assessment, diagnosis, and plan of care documented in the resident's note.  

## 2016-07-26 ENCOUNTER — Ambulatory Visit (HOSPITAL_COMMUNITY)
Admission: RE | Admit: 2016-07-26 | Discharge: 2016-07-26 | Disposition: A | Discharge status: Home / Self Care | Payer: Medicare HMO | Source: Ambulatory Visit | Attending: Internal Medicine | Admitting: Internal Medicine

## 2016-07-26 DIAGNOSIS — K229 Disease of esophagus, unspecified: Secondary | ICD-10-CM | POA: Insufficient documentation

## 2016-07-26 DIAGNOSIS — E042 Nontoxic multinodular goiter: Secondary | ICD-10-CM | POA: Diagnosis not present

## 2016-07-26 DIAGNOSIS — R042 Hemoptysis: Secondary | ICD-10-CM | POA: Diagnosis present

## 2016-07-26 DIAGNOSIS — R918 Other nonspecific abnormal finding of lung field: Secondary | ICD-10-CM | POA: Insufficient documentation

## 2016-07-26 DIAGNOSIS — R935 Abnormal findings on diagnostic imaging of other abdominal regions, including retroperitoneum: Secondary | ICD-10-CM | POA: Insufficient documentation

## 2016-07-26 LAB — POCT I-STAT CREATININE: Creatinine, Ser: 1 mg/dL (ref 0.61–1.24)

## 2016-07-26 MED ORDER — IOPAMIDOL (ISOVUE-300) INJECTION 61%
INTRAVENOUS | Status: AC
  Administered 2016-07-26: 75 mL
  Filled 2016-07-26: qty 75

## 2016-07-31 ENCOUNTER — Other Ambulatory Visit: Payer: Self-pay | Admitting: Internal Medicine

## 2016-07-31 ENCOUNTER — Telehealth: Payer: Self-pay | Admitting: Internal Medicine

## 2016-07-31 DIAGNOSIS — R042 Hemoptysis: Secondary | ICD-10-CM

## 2016-07-31 DIAGNOSIS — E041 Nontoxic single thyroid nodule: Secondary | ICD-10-CM

## 2016-07-31 NOTE — Telephone Encounter (Signed)
Tried calling patient with CT chest results but no answer. Needs to be notified that needs referral to Pulmonology for bronchoscopy and thyroid ultrasound to assess nodules incidentally found on CT. Will try again later.

## 2016-08-01 ENCOUNTER — Ambulatory Visit: Payer: Medicare HMO | Attending: Internal Medicine | Admitting: Rehabilitative and Restorative Service Providers"

## 2016-08-01 NOTE — Addendum Note (Signed)
Addended by: Hulan Fray on: 08/01/2016 06:29 PM   Modules accepted: Orders

## 2016-08-08 ENCOUNTER — Institutional Professional Consult (permissible substitution): Payer: Medicare HMO | Admitting: Pulmonary Disease

## 2016-08-08 NOTE — Progress Notes (Deleted)
Subjective:    Patient ID: [name] [name], male    DOB: Jan 20, 1956, 61 y.o.   MRN: [id number]  HPI   Review of Systems A pertinent 14 point review of systems is negative except as per the history of presenting illness.  No Known Allergies  Current Outpatient Prescriptions on File Prior to Visit  Medication Sig Dispense Refill  . albuterol (PROVENTIL HFA) 108 (90 Base) MCG/ACT inhaler Inhale 2 puffs into the lungs every 6 (six) hours as needed for wheezing. 1 Inhaler 11  . aspirin EC 81 MG tablet Take 81 mg by mouth every 6 (six) hours as needed (takes sometimes).     . cetirizine (ZYRTEC) 10 MG tablet Take 1 tablet (10 mg total) by mouth daily. 30 tablet 3  . gabapentin (NEURONTIN) 300 MG capsule Take 1 capsule (300 mg total) by mouth 3 (three) times daily. 90 capsule 0  . hydrochlorothiazide (HYDRODIURIL) 25 MG tablet TAKE ONE TABLET BY MOUTH ONCE DAILY 30 tablet 1  . pantoprazole (PROTONIX) 40 MG tablet Take 1 tablet (40 mg total) by mouth daily. 30 tablet 5  . polyethylene glycol (MIRALAX / GLYCOLAX) packet Take 17 g by mouth daily as needed for moderate constipation. Reported on 07/12/2015 (Patient not taking: Reported on 01/08/2016) 14 each 2  . sodium chloride (OCEAN) 0.65 % SOLN nasal spray Place 2 sprays into both nostrils as needed for congestion. (Patient not taking: Reported on 01/08/2016) 60 mL 1   No current facility-administered medications on file prior to visit.     Past Medical History:  Diagnosis Date  . Allergy   . Asthma   . Colon polyps    noted 10/2011 colonoscopy  . Diverticulosis    mild noted 10/2011  . GERD (gastroesophageal reflux disease)   . Hiatal hernia    small noted 10/2011  . Hiatal hernia    small  . HLD (hyperlipidemia)   . Hypertension   . Parasomnia    movement with sleep  . Pyrosis   . Sleep apnea    no cpap  . Varicose veins of both lower extremities with pain    right>left    Past Surgical History:  Procedure Laterality Date    . ABDOMINAL SURGERY  3 years ago   pt unknown what it was,states "stomach with tape on it"  . COLONOSCOPY  2013  . POLYPECTOMY      Family History  Problem Relation Age of Onset  . Cancer Mother        died at age 2 uterine cancer   . Diabetes Father   . Crohn's disease Son   . Hypertension Other        uncle  . Colon cancer Neg Hx   . Colon polyps Neg Hx   . Esophageal cancer Neg Hx   . Rectal cancer Neg Hx   . Stomach cancer Neg Hx     Social History   Social History  . Marital status: Single    Spouse name: N/A  . Number of children: 3  . Years of education: N/A   Occupational History  .  [employer]   Social History Main Topics  . Smoking status: Never Smoker  . Smokeless tobacco: Never Used  . Alcohol use 1.2 oz/week    2 Cans of beer per week     Comment: Beer sometimes occasionally.  . Drug use: No  . Sexual activity: Not on file   Other Topics Concern  . Not  on file   Social History Narrative   Financial assistance approved for 100% discount at [facility] and has [facility] card   [employer] March 03, 2010      Objective:   Physical Exam There were no vitals taken for this visit. General:  Awake. Alert. No acute distress. Sitting watching TV. Family at bedside.  Integument:  Warm & dry. No rash on exposed skin. No bruising. Extremities:  No cyanosis or clubbing.  Lymphatics:  No appreciated cervical or supraclavicular lymphadenoapthy. HEENT:  Moist mucus membranes. No oral ulcers. No scleral injection or icterus. Endotracheal tube in place. PERRL. Cardiovascular:  Regular rate. No edema. No appreciable JVD.  Pulmonary:  Good aeration & clear to auscultation bilaterally. Symmetric chest wall expansion. No accessory muscle use. Abdomen: Soft. Normal bowel sounds. Nondistended. Grossly nontender. Musculoskeletal:  Normal bulk and tone. Hand grip strength 5/5 bilaterally. No joint deformity or effusion appreciated. Neurological:  CN 2-12 grossly in  tact. No meningismus. Moving all 4 extremities equally. Symmetric brachioradialis deep tendon reflexes. Psychiatric:  Mood and affect congruent. Speech normal rhythm, rate & tone.   PFT 07/21/15: FVC 2.16 L (79%) FEV1 2.05 L (79%) FEV1/FVC 0.78 FEF 25-75 1.79 L (71%) positive bronchodilator response TLC 5.78 L (96%) RV 126% ERV 76% DLCO uncorrected 81%  IMAGING CT CHEST W/ 07/26/16 (personally reviewed by me):  Very minimal and patchy opacification left lung base. No consolidation. Mild pleural thickening particularly on the left. No pleural effusion. No pathologic mediastinal adenopathy. No pericardial effusion.    Assessment & Plan:  61 y.o.  Sonia Baller. Ashok Cordia, M.D. [facility] Pulmonary & Critical Care Pager:  [phone] After 3pm or if no response, call [phone] 1:27 PM 08/08/16

## 2016-08-27 ENCOUNTER — Other Ambulatory Visit: Payer: Self-pay | Admitting: Internal Medicine

## 2016-08-28 NOTE — Telephone Encounter (Signed)
Needs July / Aug appt PCP

## 2016-08-30 ENCOUNTER — Ambulatory Visit (HOSPITAL_COMMUNITY): Admission: RE | Admit: 2016-08-30 | Payer: Medicare HMO | Source: Ambulatory Visit

## 2016-09-04 ENCOUNTER — Encounter: Payer: Self-pay | Admitting: *Deleted

## 2016-09-13 ENCOUNTER — Institutional Professional Consult (permissible substitution): Payer: Medicare HMO | Admitting: Pulmonary Disease

## 2016-10-12 ENCOUNTER — Encounter: Payer: Medicare HMO | Admitting: Internal Medicine

## 2016-10-17 ENCOUNTER — Encounter: Payer: Self-pay | Admitting: Internal Medicine

## 2016-12-03 DIAGNOSIS — E041 Nontoxic single thyroid nodule: Secondary | ICD-10-CM | POA: Insufficient documentation

## 2016-12-03 NOTE — Progress Notes (Deleted)
   CC: ***  HPI:  Mr.Asia B Woodham is a 61 y.o. M with past medical history as described below who presents to the clinic for follow up of his chronic medical conditions.   Past Medical History:  Diagnosis Date  . Allergy   . Asthma   . Colon polyps    noted 10/2011 colonoscopy  . Diverticulosis    mild noted 10/2011  . GERD (gastroesophageal reflux disease)   . Hiatal hernia    small noted 10/2011  . Hiatal hernia    small  . HLD (hyperlipidemia)   . Hypertension   . Parasomnia    movement with sleep  . Pyrosis   . Sleep apnea    no cpap  . Varicose veins of both lower extremities with pain    right>left   Review of Systems:  ROS   Physical Exam:  There were no vitals filed for this visit. Physical Exam  Assessment & Plan:   See Encounters Tab for problem based charting.  Patient {GC/GE:3044014::"discussed with","seen with"} Dr. {NAMES:[id number]::"Butcher","Granfortuna","E. Hoffman","Klima","Mullen","Narendra","Raines","Vincent"}]

## 2016-12-04 ENCOUNTER — Encounter: Payer: Self-pay | Admitting: Internal Medicine

## 2016-12-04 ENCOUNTER — Encounter: Payer: Medicare HMO | Admitting: Internal Medicine

## 2016-12-06 ENCOUNTER — Telehealth: Payer: Self-pay | Admitting: *Deleted

## 2016-12-06 ENCOUNTER — Ambulatory Visit (INDEPENDENT_AMBULATORY_CARE_PROVIDER_SITE_OTHER): Payer: Medicare HMO | Admitting: Internal Medicine

## 2016-12-06 VITALS — BP 114/83 | HR 80 | Temp 97.9°F | Ht 66.0 in | Wt 243.5 lb

## 2016-12-06 DIAGNOSIS — I83899 Varicose veins of unspecified lower extremities with other complications: Secondary | ICD-10-CM | POA: Diagnosis not present

## 2016-12-06 DIAGNOSIS — R079 Chest pain, unspecified: Secondary | ICD-10-CM | POA: Diagnosis not present

## 2016-12-06 DIAGNOSIS — E041 Nontoxic single thyroid nodule: Secondary | ICD-10-CM

## 2016-12-06 DIAGNOSIS — K219 Gastro-esophageal reflux disease without esophagitis: Secondary | ICD-10-CM

## 2016-12-06 DIAGNOSIS — R252 Cramp and spasm: Secondary | ICD-10-CM

## 2016-12-06 DIAGNOSIS — G4762 Sleep related leg cramps: Secondary | ICD-10-CM

## 2016-12-06 DIAGNOSIS — Z79899 Other long term (current) drug therapy: Secondary | ICD-10-CM

## 2016-12-06 MED ORDER — GABAPENTIN 300 MG PO CAPS: 300.0000 mg | ORAL_CAPSULE | Freq: Three times a day (TID) | ORAL | 0 refills | Status: DC

## 2016-12-06 MED ORDER — PANTOPRAZOLE SODIUM 40 MG PO TBEC: 40.0000 mg | DELAYED_RELEASE_TABLET | Freq: Every day | ORAL | 2 refills | Status: DC

## 2016-12-06 NOTE — Assessment & Plan Note (Signed)
THis problem is chronic and has exacerbated recently due to him not taking pantoprazole daily which has been prescribed in the past. He says this usually helps with his symptoms.  Plan -refilled his pantoprazole

## 2016-12-06 NOTE — Patient Instructions (Signed)
Thank you for your visit today  Please take an extra tablet of gabapentin at night. Please have good sleep hygiene- go to bed at same time every day.  Please wear your compression stockings  Please take the pantoprazole for your heartburn  I will let you know about your lab results  Please follow up next month with your PCP

## 2016-12-06 NOTE — Telephone Encounter (Signed)
Pt c/o muscle cramps and wants med for it, cannot find recent refill or script, he states been awhile, spoke w/ dr Eppie Gibson, in 1045 for ACC, informed gladysh.

## 2016-12-06 NOTE — Assessment & Plan Note (Addendum)
Thyroid nodules found on CT chest. No record of Thyroid labs in the chart  Plan -order TSH and free T4. May need thyroid imaging based on the results. -follow up next month   Addendum: TSH and free T4 within normal limits

## 2016-12-06 NOTE — Progress Notes (Signed)
   CC:  leg cramps, thyroid nodule found on CT chest, and GERD  HPI: [name] is a 61 y.o. man with PMH noted below here for leg cramps, thyroid nodule found on CT chest, and GERD  Please see Problem List/A&P for the status of the patient's chronic medical problems   Past Medical History:  Diagnosis Date  . Allergy   . Asthma   . Colon polyps    noted 10/2011 colonoscopy  . Diverticulosis    mild noted 10/2011  . GERD (gastroesophageal reflux disease)   . Hiatal hernia    small noted 10/2011  . Hiatal hernia    small  . HLD (hyperlipidemia)   . Hypertension   . Parasomnia    movement with sleep  . Pyrosis   . Sleep apnea    no cpap  . Varicose veins of both lower extremities with pain    right>left    Review of Systems:  Denies fevers, chills, weight loss Denies n/v, has heartburn symptoms Denies n/v/d/c Has leg cramps. No leg pain or weakness Has some swelling of ankles at night  Physical Exam: Vitals:   12/06/16 1113  BP: 114/83  Pulse: 80  Temp: 97.9 F (36.6 C)  TempSrc: Oral  SpO2: 99%  Weight: 243 lb 8 oz (110.5 kg)  Height: 5\' 6"  (1.676 m)    General: A&O, in NAD Neck: supple, midline trachea CV: RRR, normal s1, s2, no m/r/g Resp: equal and symmetric breath sounds, no wheezing or crackles  Abdomen: soft, nontender, nondistended, +BS Extremities: pulses intact b/l, no edema. Normal strength in both legs    Assessment & Plan:   See encounters tab for problem based medical decision making. Patient discussed with Dr. Eppie Gibson

## 2016-12-06 NOTE — Assessment & Plan Note (Addendum)
  Patient says that he has been having intermittent leg cramps, it happens when he goes to bed at night, and he has the urge to move his leg which improves the cramps. The cramps are mainly in the calf bilaterally. He denies any leg pain or weakness.  He has varicose veins and says he notices some edema at legs towards the end of the day.  A: his symptoms are most likely due to restless legs, however he also has varicose veins and has not been wearing compression stockings. He also takes gabapentin which he says helps with the pain/neuropathy.  His ferritin last checked was > 100. However RLS can be present even with normal ferritin  Plan - Added one additional tablet of 300 mg gabapentin at bedtime -advised to wear compression stockings daily -follow up next month

## 2016-12-07 LAB — TSH: TSH: 2.21 u[IU]/mL (ref 0.450–4.500)

## 2016-12-07 LAB — T4, FREE: Free T4: 1.23 ng/dL (ref 0.82–1.77)

## 2016-12-10 NOTE — Progress Notes (Signed)
Patient ID: [name] [name], male   DOB: April 11, 1955, 61 y.o.   MRN: [id number]  Case discussed with Dr. Tiburcio Pea at the time of the visit.  We reviewed the resident's history and exam and pertinent patient test results.  I agree with the assessment, diagnosis, and plan of care documented in the resident's note.

## 2016-12-10 NOTE — Addendum Note (Signed)
Addended by: Oval Linsey D on: 12/10/2016 08:12 AM   Modules accepted: Level of Service

## 2016-12-26 ENCOUNTER — Encounter: Payer: Self-pay | Admitting: Pulmonary Disease

## 2016-12-26 ENCOUNTER — Ambulatory Visit (INDEPENDENT_AMBULATORY_CARE_PROVIDER_SITE_OTHER): Payer: Medicare HMO | Admitting: Pulmonary Disease

## 2016-12-26 VITALS — BP 128/76 | HR 89 | Ht 66.0 in | Wt 245.8 lb

## 2016-12-26 DIAGNOSIS — G4719 Other hypersomnia: Secondary | ICD-10-CM | POA: Diagnosis not present

## 2016-12-26 NOTE — Patient Instructions (Signed)
We scheduled for a sleep study We need to get you started on treatment for sleep apnea After the we start CPAP and you are stable then we will consider a bronchoscopy.  Return in 2 months

## 2016-12-26 NOTE — Progress Notes (Signed)
[name] [name]    [id number]    1955/10/21  Primary Care Physician:Harden, Jenetta Downer, MD  Referring Physician: Juliet Rude, MD No address on file  Chief complaint:  Consult for evaluation of hemoptysis  HPI: 61 year old with past medical history of hypertension, allergies, sleep apnea, GERD. He has complains of intermittent hemoptysis for the past 1 year. He brings up bright red blood with no sputum production. This lasts for a few days and typically resolves spontaneously. He hasn't had an episode of hemoptysis for the past 2 months. He had a CT chest on 07/26/16. His been referred to [facility] for further evaluation and consideration of bronchoscopy.  He is a never smoker. He has history of sleep apnea with a sleep study done in 2013 which showed severe sleep apnea and CPAP titration to 10. The titration study was terminated early and the patient never got started on CPAP.  Outpatient Encounter Prescriptions as of 12/26/2016  Medication Sig  . albuterol (PROVENTIL HFA) 108 (90 Base) MCG/ACT inhaler Inhale 2 puffs into the lungs every 6 (six) hours as needed for wheezing.  Marland Kitchen aspirin EC 81 MG tablet Take 81 mg by mouth every 6 (six) hours as needed (takes sometimes).   . cetirizine (ZYRTEC) 10 MG tablet Take 1 tablet (10 mg total) by mouth daily.  Marland Kitchen gabapentin (NEURONTIN) 300 MG capsule Take 1 capsule (300 mg total) by mouth 3 (three) times daily. Take 1 extra pill at bedtime  . hydrochlorothiazide (HYDRODIURIL) 25 MG tablet TAKE ONE TABLET BY MOUTH ONCE DAILY  . pantoprazole (PROTONIX) 40 MG tablet Take 1 tablet (40 mg total) by mouth daily.  . polyethylene glycol (MIRALAX / GLYCOLAX) packet Take 17 g by mouth daily as needed for moderate constipation. Reported on 07/12/2015  . sodium chloride (OCEAN) 0.65 % SOLN nasal spray Place 2 sprays into both nostrils as needed for congestion.   No facility-administered encounter medications on file as of 12/26/2016.     Allergies as of  12/26/2016  . (No Known Allergies)    Past Medical History:  Diagnosis Date  . Allergy   . Asthma   . Colon polyps    noted 10/2011 colonoscopy  . Diverticulosis    mild noted 10/2011  . GERD (gastroesophageal reflux disease)   . Hiatal hernia    small noted 10/2011  . Hiatal hernia    small  . HLD (hyperlipidemia)   . Hypertension   . Parasomnia    movement with sleep  . Pyrosis   . Sleep apnea    no cpap  . Varicose veins of both lower extremities with pain    right>left    Past Surgical History:  Procedure Laterality Date  . ABDOMINAL SURGERY  3 years ago   pt unknown what it was,states "stomach with tape on it"  . COLONOSCOPY  2013  . POLYPECTOMY      Family History  Problem Relation Age of Onset  . Cancer Mother        died at age 81 uterine cancer   . Diabetes Father   . Crohn's disease Son   . Hypertension Other        uncle  . Colon cancer Neg Hx   . Colon polyps Neg Hx   . Esophageal cancer Neg Hx   . Rectal cancer Neg Hx   . Stomach cancer Neg Hx     Social History   Social History  . Marital  status: Single    Spouse name: N/A  . Number of children: 3  . Years of education: N/A   Occupational History  .  [employer]   Social History Main Topics  . Smoking status: Never Smoker  . Smokeless tobacco: Never Used  . Alcohol use 1.2 oz/week    2 Cans of beer per week     Comment: Beer sometimes occasionally.  . Drug use: No  . Sexual activity: Not on file   Other Topics Concern  . Not on file   Social History Narrative   Financial assistance approved for 100% discount at [facility] and has [facility] card   [facility] March 03, 2010    Review of systems: Review of Systems  Constitutional: Negative for fever and chills.  HENT: Negative.   Eyes: Negative for blurred vision.  Respiratory: as per HPI  Cardiovascular: Negative for chest pain and palpitations.  Gastrointestinal: Negative for vomiting, diarrhea, blood per  rectum. Genitourinary: Negative for dysuria, urgency, frequency and hematuria.  Musculoskeletal: Negative for myalgias, back pain and joint pain.  Skin: Negative for itching and rash.  Neurological: Negative for dizziness, tremors, focal weakness, seizures and loss of consciousness.  Endo/Heme/Allergies: Negative for environmental allergies.  Psychiatric/Behavioral: Negative for depression, suicidal ideas and hallucinations.  All other systems reviewed and are negative.  Physical Exam: Blood pressure 128/76, pulse 89, height 5\' 6"  (1.676 m), weight 111.5 kg (245 lb 12.8 oz), SpO2 100 %. Gen:      No acute distress HEENT:  EOMI, sclera anicteric Neck:     No masses; no thyromegaly Lungs:    Clear to auscultation bilaterally; normal respiratory effort CV:         Regular rate and rhythm; no murmurs Abd:      + bowel sounds; soft, non-tender; no palpable masses, no distension Ext:    No edema; adequate peripheral perfusion Skin:      Warm and dry; no rash Neuro: alert and oriented x 3 Psych: normal mood and affect  Data Reviewed: CT chest 07/26/16 Mild opacity in the left lower lobe. Thyroid nodules, small hiatal hernia Mild diffuse esophageal wall thickening likely due to esophagitis.  Sleep study 12/09/11 1. The patient terminated the study at about 2:30 a.m. before     completion of final titration. 2. During recording time, CPAP was titrated to a best pressure of 10     CWP, AHI 3.2 per hour.  There were a few breakthrough events still     being noted at pressures up to 12 CWP.  He wore a standard ResMed     Mirage FX nasal mask with heated humidifier.  Suggest initial home     trial at 10 CWP. 3. Baseline diagnostic NPSG at May 15, 2011, had recorded AHI of     48.1 per hour.  Body weight was 226 pounds for that study.  Sleep study 05/05/11 Severe sleep apnea, AHI 48  Assessment:  Evaluation for hemoptysis Suspicion for lung malignancy is low in a nonsmoker. CT scan of  the chest does not show any acute abnormalities except for a small left lower lobe groundglass opacity that is not likely to be related to his symptoms. Suspect his hemoptysis may be related to his esophagitis and acid reflux. I have discussed bronchoscopy the patient and he is okay to proceed to make sure there is no endobronchial lesion. I like to ensure that he is adequately treated for sleep apnea before proceeding  Severe OSA,  untreated Not sure why he never got started on CPAP. We will repeat the sleep study with titration and order CPAP.  Plan/Recommendations: - Repeat sleep study, initiate CPAP - Consider bronchoscopy after OSA is adequately treated.  Marshell Garfinkel MD [place] [facility] Pager [phone] 12/26/2016, 11:52 AM  CC: Rivet, Sindy Guadeloupe, MD

## 2017-01-01 ENCOUNTER — Encounter: Payer: Self-pay | Admitting: Internal Medicine

## 2017-01-01 ENCOUNTER — Ambulatory Visit (INDEPENDENT_AMBULATORY_CARE_PROVIDER_SITE_OTHER): Payer: Medicare HMO | Admitting: Internal Medicine

## 2017-01-01 VITALS — BP 115/67 | HR 98 | Temp 98.4°F | Ht 66.0 in | Wt 245.4 lb

## 2017-01-01 DIAGNOSIS — J302 Other seasonal allergic rhinitis: Secondary | ICD-10-CM | POA: Diagnosis not present

## 2017-01-01 DIAGNOSIS — I10 Essential (primary) hypertension: Secondary | ICD-10-CM | POA: Diagnosis not present

## 2017-01-01 DIAGNOSIS — E041 Nontoxic single thyroid nodule: Secondary | ICD-10-CM | POA: Diagnosis not present

## 2017-01-01 DIAGNOSIS — R042 Hemoptysis: Secondary | ICD-10-CM | POA: Diagnosis not present

## 2017-01-01 DIAGNOSIS — G4733 Obstructive sleep apnea (adult) (pediatric): Secondary | ICD-10-CM

## 2017-01-01 DIAGNOSIS — I8390 Asymptomatic varicose veins of unspecified lower extremity: Secondary | ICD-10-CM

## 2017-01-01 DIAGNOSIS — Z23 Encounter for immunization: Secondary | ICD-10-CM

## 2017-01-01 MED ORDER — FLUTICASONE PROPIONATE 50 MCG/ACT NA SUSP
1.0000 | Freq: Every day | NASAL | 0 refills | Status: DC
Start: 2017-01-01 — End: 2017-05-20

## 2017-01-01 NOTE — Assessment & Plan Note (Addendum)
BP continues to be well controlled on HCTZ 25 mg daily. Continue without changes. Repeat BMP today.

## 2017-01-01 NOTE — Assessment & Plan Note (Signed)
Pt notes worsening of seasonal allergies with increased nasal congestion, currently on Zyrtec and nasal saline spray. He was provided instructions on using 'netti pot' irrigation for symptomatic relief. Also provided prescription for flonase with instructions to administer after nasal irrigation for better effectiveness.

## 2017-01-01 NOTE — Assessment & Plan Note (Signed)
Chest CT notable for patchy interstital opacity to LLL. He has subsequently been evaluated by pulmonology who felt CT findings were unlikely to explain hemoptysis with more likely explanation being GERD. Bronchoscopy is under consideration to rule out an endobronchial lesion, however, his OSA needs to be better treated prior to this. For full details, see pulmonology office visit note. Appreciate their assistance with this pt.

## 2017-01-01 NOTE — Progress Notes (Signed)
   CC: Leg pain   HPI:  [name] [name] is a 61 y.o. M with past medical history as described below who presents to the clinic for follow up of varicose veins and chronic medical problems.   HTN: No complaints of headache, blurred vision, dizziness, light-headedness. Reports taking medication regimen as prescribed.   Leg Pain: Pt has bilateral leg pain with associated varicose veins which are worse in his R leg. He states he has compression stockings but has not worn them due to difficulty putting them on and discomfort.  Allergic Rhinitis: Reports history of seasonal allergies which have recently been worse than baseline with increased nasal drainage. Has been trying his prescribed anti-histamine and saline nasal spray without significant relief.   OSA: The pt was recently evaluated by pulmonology who ordered a repeat sleep study in order to optimally treat his OSA. He endorses a history of significant snoring and daytime somnolence.   Thyroid Nodule: Pt states he has not received information regarding scheduling a thyroid US to better characterize an incidentally discovered thyroid nodule. Pt is eager to find out more information.   Past Medical History:  Diagnosis Date  . Allergy   . Asthma   . Colon polyps    noted 10/2011 colonoscopy  . Diverticulosis    mild noted 10/2011  . GERD (gastroesophageal reflux disease)   . Hiatal hernia    small noted 10/2011  . Hiatal hernia    small  . HLD (hyperlipidemia)   . Hypertension   . Parasomnia    movement with sleep  . Pyrosis   . Sleep apnea    no cpap  . 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  HENT: Positive for congestion. Negative for sore throat.   Eyes: Negative for blurred vision.  Neurological: Negative for dizziness and headaches.     Physical Exam:  Vitals:   01/01/17 1523  BP: 115/67  Pulse: 98    Temp: 98.4 F (36.9 C)  TempSrc: Oral  SpO2: 100%  Weight: 245 lb 6.4 oz (111.3 kg)  Height: 5\' 6"  (1.676 m)   Physical Exam  Constitutional: He is oriented to person, place, and time. He appears well-developed and well-nourished.  Cardiovascular: Normal rate, regular rhythm and normal heart sounds.   Pulmonary/Chest: Effort normal and breath sounds normal.  Abdominal: Soft. He exhibits no distension. There is no tenderness.  Musculoskeletal:  Marked varicose veins in bilateral LEs R>L with tenderness to palpation, no erythema, 2+ DP, PT pulses   Neurological: He is alert and oriented to person, place, and time.    Assessment & Plan:   See Encounters Tab for problem based charting.  Patient seen with Dr. Evette Doffing

## 2017-01-01 NOTE — Patient Instructions (Addendum)
Nice to meet you [name] putting in a referral to the pain specialist. Wear your compression stockings until then to try to help your veins as much as possible.  Don't forget to go to your sleep study coming up in November to help treat your sleep apnea.  We will try to get your thyroid ultrasound scheduled soon.  For your nasal congestion I wrote a prescription for a medicine called Flonase, and also try a Netti pot that he can get over-the-counter. Here are instructions on how to use it. These instructions also include how to make the solution if you do not want to buy it over-the-counter.  BUFFERED ISOTONIC SALINE NASAL IRRIGATION  The Benefits:  1. When you irrigate, the isotonic saline (salt water) acts as a solvent and washes the mucus crusts and other debris from your nose.  2. This decongests and improves the airflow into your nose. The sinus passages begin to open.  3. Studies have also shown that a salt water and an alkaline (baking soda) irrigation solution improves nasal membrane cell function (mucociliary flow of mucus debris).  The Recipe:  1. Choose a 1-quart glass jar that is thoroughly cleansed.  2. Fill with sterile or distilled water, or you can boil water from the tap.  3. Add 1 to 2 heaping teaspoons of "pickling/canning/sea" salt (NOT table salt as it contains a large number of additives). This salt is available at the grocery store in the food canning section.  4. Add 1 teaspoon of Arm & Hammer Baking Soda (pure bicarbonate).  5. Mix ingredients together and store at room temperature. Discard after one week. If you find this solution too strong, you may decrease the amount of salt added to 1 to 1  teaspoons. With children it is often best to start with a milder solution and advance slowly. Irrigate with 240 ml (8 oz) twice daily.  The Instructions:  You should plan to irrigate your nose with buffered isotonic saline 2 times per day. Many people  prefer to warm the solution slightly in the microwave - but be sure that the solution is NOT HOT. Stand over the sink (some do this in the shower) and squirt the solution into each side of your nose, keeping your mouth open. This allows you to spit the saltwater out of your mouth. It will not harm you if you swallow a little.  If you have been told to use a nasal steroid such as Flonase, Nasonex, or Nasacort, you should always use isortonic saline solution first, then use your nasal steroid product. The nasal steroid is much more effective when sprayed onto clean nasal membranes and the steroid medicine will reach deeper into the nose.  Most people experience a little burning sensation the first few times they use a isotonic saline solution, but this usually goes away within a few days.

## 2017-01-01 NOTE — Assessment & Plan Note (Signed)
Pt has a known history of OSA after sleep study, however, he did not undergo CPAP titration and states he never received a machine. He was recently evaluated by pulmonology for chronic, intermittent hemoptysis who wished to optimize his OSA management. He was willing to repeat the sleep study and potential titration and complete the process to receive and use a CPAP machine. Sleep study scheduled for Nov 21st, encouraged to keep this appointment.

## 2017-01-01 NOTE — Assessment & Plan Note (Signed)
He had an incidental 10.2 mm thyroid nodule and other smaller nodules incidentally discovered on CT chest with normal TSH, T4. Recommendations are for thyroid US in order to better characterize the nodule and features. This was ordered in May '18, chart review shows pt cancelled/no showed to previously scheduled [place] and has since not been rescheduled, though order remains active. [facility] nursing staff will contact appropriate departments to ensure this is scheduled in the near future.

## 2017-01-01 NOTE — Assessment & Plan Note (Signed)
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 He notes concern for a clot, however, the chronicity of his sx, TTP of varicosities, no erythema are not indicative of DVT, and no superficial clot appreciated on exam. He has compression stockings but has not worn them though he has been recommended to at multiple visits. He was previously evaluated by Vascular surgery at [facility] in 2013 who provided compression stockings and noted that further intervention would likely be required, however, it appears he did not follow up following this evaluation. He was willing to be re-evaluated by VVS and desired to be seen in [place]. He was again encouraged to use his compression stockings, especially in the lead up to VVS evaluation, and reassured that DVT was very unlikely at this point.   --Vascular surgery referral  --Continue to encourage compression stockings

## 2017-01-02 LAB — BMP8+ANION GAP
ANION GAP: 17 mmol/L (ref 10.0–18.0)
BUN/Creatinine Ratio: 16 (ref 10–24)
BUN: 18 mg/dL (ref 8–27)
CO2: 24 mmol/L (ref 20–29)
Calcium: 9.4 mg/dL (ref 8.6–10.2)
Chloride: 102 mmol/L (ref 96–106)
Creatinine, Ser: 1.11 mg/dL (ref 0.76–1.27)
GFR, EST AFRICAN AMERICAN: 83 mL/min/{1.73_m2} (ref >59)
GFR, EST NON AFRICAN AMERICAN: 72 mL/min/{1.73_m2} (ref >59)
Glucose: 91 mg/dL (ref 65–99)
Potassium: 3.5 mmol/L (ref 3.5–5.2)
Sodium: 143 mmol/L (ref 134–144)

## 2017-01-02 NOTE — Progress Notes (Signed)
Internal Medicine Clinic Attending  I saw and evaluated the patient.  I personally confirmed the key portions of the history and exam documented by Dr. Harden and I reviewed pertinent patient test results.  The assessment, diagnosis, and plan were formulated together and I agree with the documentation in the resident's note.  

## 2017-01-07 ENCOUNTER — Other Ambulatory Visit: Payer: Self-pay

## 2017-01-07 DIAGNOSIS — I83813 Varicose veins of bilateral lower extremities with pain: Secondary | ICD-10-CM

## 2017-01-11 ENCOUNTER — Ambulatory Visit (HOSPITAL_COMMUNITY)
Admission: RE | Admit: 2017-01-11 | Discharge: 2017-01-11 | Disposition: A | Discharge status: Home / Self Care | Payer: Medicare HMO | Source: Ambulatory Visit | Attending: Internal Medicine | Admitting: Internal Medicine

## 2017-01-11 DIAGNOSIS — E041 Nontoxic single thyroid nodule: Secondary | ICD-10-CM | POA: Insufficient documentation

## 2017-02-04 ENCOUNTER — Inpatient Hospital Stay (HOSPITAL_COMMUNITY): Admission: RE | Admit: 2017-02-04 | Payer: Medicare HMO | Source: Ambulatory Visit

## 2017-02-04 ENCOUNTER — Encounter: Payer: Medicare HMO | Admitting: Vascular Surgery

## 2017-02-04 NOTE — Addendum Note (Signed)
Addended by: Hulan Fray on: 02/04/2017 08:43 PM   Modules accepted: Orders

## 2017-02-13 ENCOUNTER — Ambulatory Visit (HOSPITAL_BASED_OUTPATIENT_CLINIC_OR_DEPARTMENT_OTHER): Payer: Medicare HMO | Attending: Pulmonary Disease

## 2017-05-20 ENCOUNTER — Other Ambulatory Visit: Payer: Self-pay | Admitting: *Deleted

## 2017-05-20 DIAGNOSIS — J301 Allergic rhinitis due to pollen: Secondary | ICD-10-CM

## 2017-05-20 MED ORDER — CETIRIZINE HCL 10 MG PO TABS: 10.0000 mg | ORAL_TABLET | Freq: Every day | ORAL | 3 refills | Status: DC

## 2017-05-20 MED ORDER — FLUTICASONE PROPIONATE 50 MCG/ACT NA SUSP: 1.0000 | Freq: Every day | NASAL | 0 refills | Status: DC

## 2017-05-20 MED ORDER — HYDROCHLOROTHIAZIDE 25 MG PO TABS
25.0000 mg | ORAL_TABLET | Freq: Every day | ORAL | 3 refills | Status: DC
Start: 2017-05-20 — End: 2017-12-17

## 2017-05-27 ENCOUNTER — Emergency Department (EMERGENCY_DEPARTMENT_HOSPITAL): Payer: Medicare HMO

## 2017-05-27 ENCOUNTER — Encounter (HOSPITAL_COMMUNITY): Payer: Self-pay | Admitting: *Deleted

## 2017-05-27 ENCOUNTER — Emergency Department (HOSPITAL_COMMUNITY)
Admission: EM | Admit: 2017-05-27 | Discharge: 2017-05-27 | Disposition: A | Discharge status: Home / Self Care | Payer: Medicare HMO | Attending: Emergency Medicine | Admitting: Emergency Medicine

## 2017-05-27 ENCOUNTER — Other Ambulatory Visit: Payer: Self-pay

## 2017-05-27 DIAGNOSIS — Y998 Other external cause status: Secondary | ICD-10-CM | POA: Insufficient documentation

## 2017-05-27 DIAGNOSIS — M25561 Pain in right knee: Secondary | ICD-10-CM | POA: Diagnosis not present

## 2017-05-27 DIAGNOSIS — Y939 Activity, unspecified: Secondary | ICD-10-CM | POA: Diagnosis not present

## 2017-05-27 DIAGNOSIS — M7989 Other specified soft tissue disorders: Secondary | ICD-10-CM | POA: Diagnosis not present

## 2017-05-27 DIAGNOSIS — R2241 Localized swelling, mass and lump, right lower limb: Secondary | ICD-10-CM | POA: Insufficient documentation

## 2017-05-27 DIAGNOSIS — Y92410 Unspecified street and highway as the place of occurrence of the external cause: Secondary | ICD-10-CM | POA: Diagnosis not present

## 2017-05-27 DIAGNOSIS — Z5321 Procedure and treatment not carried out due to patient leaving prior to being seen by health care provider: Secondary | ICD-10-CM | POA: Insufficient documentation

## 2017-05-27 NOTE — ED Notes (Signed)
Pt stated he could not wait for his Discharge Paperwork. Pt and Family have left.
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
## 2017-05-27 NOTE — ED Triage Notes (Signed)
Pt has swelling to right leg and wants to be checked for blood clot too.

## 2017-05-27 NOTE — Progress Notes (Signed)
VASCULAR LAB PRELIMINARY  PRELIMINARY  PRELIMINARY  PRELIMINARY  Right lower extremity venous duplex completed.    Preliminary report:  There is no DVT or SVT noted in the right lower extremity.  There are multiple varicosities noted throughout with no thrombosis.  Called report to [place], [facility], RVT 05/27/2017, 3:32 PM
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
## 2017-07-09 ENCOUNTER — Encounter: Payer: Medicare HMO | Admitting: Internal Medicine

## 2017-07-09 ENCOUNTER — Encounter: Payer: Self-pay | Admitting: Internal Medicine

## 2017-07-25 ENCOUNTER — Emergency Department (HOSPITAL_COMMUNITY)
Admission: EM | Admit: 2017-07-25 | Discharge: 2017-07-25 | Disposition: A | Discharge status: Home / Self Care | Payer: Medicare HMO | Attending: Emergency Medicine | Admitting: Emergency Medicine

## 2017-07-25 ENCOUNTER — Other Ambulatory Visit: Payer: Self-pay

## 2017-07-25 ENCOUNTER — Encounter (HOSPITAL_COMMUNITY): Payer: Self-pay | Admitting: *Deleted

## 2017-07-25 ENCOUNTER — Emergency Department (HOSPITAL_COMMUNITY): Payer: Medicare HMO

## 2017-07-25 DIAGNOSIS — Z7982 Long term (current) use of aspirin: Secondary | ICD-10-CM | POA: Diagnosis not present

## 2017-07-25 DIAGNOSIS — M79672 Pain in left foot: Secondary | ICD-10-CM | POA: Diagnosis present

## 2017-07-25 DIAGNOSIS — M7662 Achilles tendinitis, left leg: Secondary | ICD-10-CM

## 2017-07-25 DIAGNOSIS — Z79899 Other long term (current) drug therapy: Secondary | ICD-10-CM | POA: Diagnosis not present

## 2017-07-25 DIAGNOSIS — J45909 Unspecified asthma, uncomplicated: Secondary | ICD-10-CM | POA: Insufficient documentation

## 2017-07-25 DIAGNOSIS — I10 Essential (primary) hypertension: Secondary | ICD-10-CM | POA: Insufficient documentation

## 2017-07-25 MED ORDER — IBUPROFEN 400 MG PO TABS: 400.0000 mg | ORAL_TABLET | Freq: Four times a day (QID) | ORAL | 0 refills | Status: DC | PRN

## 2017-07-25 NOTE — ED Triage Notes (Signed)
Pt in c/o left heel pain that started this morning, denies injury

## 2017-07-25 NOTE — ED Notes (Signed)
Pt verbalizes understanding of d/c instructions. Pt received prescriptions. Pt ambulatory at d/c with all belongings.  

## 2017-07-25 NOTE — Discharge Instructions (Addendum)
You may alternate taking Tylenol and Ibuprofen as needed for pain control. You may take 400-600 mg of ibuprofen every 6 hours and [phone] mg of Tylenol every 6 hours. Do not exceed 4000 mg of Tylenol daily as this can lead to liver damage. Also, make sure to take Ibuprofen with meals as it can cause an upset stomach. Do not take other NSAIDs while taking Ibuprofen such as (Aleve, Naprosyn, Aspirin, Celebrex, etc) and do not take more than the prescribed dose as this can lead to ulcers and bleeding in your GI tract. You may use warm and cold compresses to help with your symptoms.   You were given a referral to the orthopedics office.  You may call Dr. Sherle Poe office to make an appointment for follow-up for further treatment of your symptoms.  Please follow up with your primary doctor within the next 7-10 days for re-evaluation and further treatment of your symptoms.   Please return to the ER sooner if you have any new or worsening symptoms.

## 2017-07-25 NOTE — ED Provider Notes (Signed)
[name] [name]   CSN: [id number] Arrival date & time: 07/25/17  1617     History   Chief Complaint Chief Complaint  Patient presents with  . Foot Injury    HPI [name] [name] is a 62 y.o. male.  HPI   Pt is a 62 year old male who presents the ED today complaining of left heel pain that began last night after he completed a bunch of yard work at home.  States he has no pain at rest however when he walks or pushes on the posterior aspect of the heel pain.  He has not tried taking anything for his symptoms.  Rates pain is severe in nature and intermittent.  Denies any other injuries.  No weakness or numbness to his feet.  He is able to move his ankle and foot without difficulty.  He is never had symptoms like this before.  Past Medical History:  Diagnosis Date  . Allergy   . Asthma   . Colon polyps    noted 10/2011 colonoscopy  . Diverticulosis    mild noted 10/2011  . GERD (gastroesophageal reflux disease)   . Hiatal hernia    small noted 10/2011  . Hiatal hernia    small  . HLD (hyperlipidemia)   . Hypertension   . Parasomnia    movement with sleep  . Pyrosis   . Sleep apnea    no cpap  . Varicose veins of both lower extremities with pain    right>left    Patient Active Problem List   Diagnosis Date Noted  . Leg cramp 12/06/2016  . Thyroid nodule 12/03/2016  . Left thigh pain 07/20/2016  . Gait disturbance 07/19/2016  . Cough 02/14/2016  . Constipation 07/16/2015  . Normocytic anemia 07/16/2015  . Shortness of breath 07/16/2015  . Hemoptysis 11/25/2014  . Seasonal allergies 01/07/2014  . Obesity 11/19/2011  . Health care maintenance 11/11/2011  . GERD (gastroesophageal reflux disease) 08/15/2011  . OSA (obstructive sleep apnea) 06/21/2011    Class: Chronic  . Dyslipidemia 07/05/2009  . Essential hypertension 07/05/2009  . VARICOSE VEINS, LOWER EXTREMITIES 07/05/2009    Past Surgical History:    Procedure Laterality Date  . ABDOMINAL SURGERY  3 years ago   pt unknown what it was,states "stomach with tape on it"  . COLONOSCOPY  2013  . POLYPECTOMY          Home Medications    Prior to Admission medications   Medication Sig Start Date End Date Taking? Authorizing Provider  albuterol (PROVENTIL HFA) 108 (90 Base) MCG/ACT inhaler Inhale 2 puffs into the lungs every 6 (six) hours as needed for wheezing. 07/12/15 09/08/17  Rivet, Sindy Guadeloupe, MD  aspirin EC 81 MG tablet Take 81 mg by mouth every 6 (six) hours as needed (takes sometimes).     [provider]  cetirizine (ZYRTEC) 10 MG tablet Take 1 tablet (10 mg total) by mouth daily. 05/20/17   Tawny Asal, MD  fluticasone (FLONASE) 50 MCG/ACT nasal spray Place 1 spray into both nostrils daily. 05/20/17 05/20/18  Tawny Asal, MD  gabapentin (NEURONTIN) 300 MG capsule Take 1 capsule (300 mg total) by mouth 3 (three) times daily. Take 1 extra pill at bedtime 12/06/16   Burgess Estelle, MD  hydrochlorothiazide (HYDRODIURIL) 25 MG tablet Take 1 tablet (25 mg total) by mouth daily. 05/20/17   Tawny Asal, MD  ibuprofen (ADVIL,MOTRIN) 400 MG tablet Take 1 tablet (400 mg total) by mouth every  6 (six) hours as needed. 07/25/17   Whitaker Holderman S, PA-C  pantoprazole (PROTONIX) 40 MG tablet Take 1 tablet (40 mg total) by mouth daily. 12/06/16   Burgess Estelle, MD  polyethylene glycol (MIRALAX / Floria Raveling) packet Take 17 g by mouth daily as needed for moderate constipation. Reported on 07/12/2015 07/12/15   Rivet, Sindy Guadeloupe, MD  sodium chloride (OCEAN) 0.65 % SOLN nasal spray Place 2 sprays into both nostrils as needed for congestion. 07/12/15   Rivet, Sindy Guadeloupe, MD    Family History Family History  Problem Relation Age of Onset  . Cancer Mother        died at age 26 uterine cancer   . Diabetes Father   . Crohn's disease Son   . Hypertension Other        uncle  . Colon cancer Neg Hx   . Colon polyps Neg Hx   . Esophageal cancer Neg Hx   .  Rectal cancer Neg Hx   . Stomach cancer Neg Hx     Social History Social History   Tobacco Use  . Smoking status: Never Smoker  . Smokeless tobacco: Never Used  Substance Use Topics  . Alcohol use: Yes    Alcohol/week: 1.2 oz    Types: 2 Cans of beer per week    Comment: Beer sometimes occasionally.  . Drug use: No     Allergies   Patient has no known allergies.   Review of Systems Review of Systems  Constitutional: Negative for fever.  Musculoskeletal:       Heel pain  Skin: Negative for wound.     Physical Exam Updated Vital Signs BP (!) 141/104 (BP Location: Right Arm)   Pulse 76   Temp 98.1 F (36.7 C) (Oral)   Resp 16   SpO2 100%   Physical Exam  Constitutional: He is oriented to person, place, and time. He appears well-developed and well-nourished. No distress.  Eyes: Conjunctivae are normal.  Cardiovascular: Normal rate and regular rhythm.  Pulmonary/Chest: Effort normal and breath sounds normal.  Musculoskeletal:  ttp to superior aspect of calcaneus near attachment of achilles tendon. Achilles tendon is intact. FROM of bilat ankle joint without erythema or warmth. 5/5 strength with dorsiflexion/plantarflexion. Able to wiggle toes bilat. Sensation intact. Brisk cap refill.   Neurological: He is alert and oriented to person, place, and time.  Skin: Skin is warm and dry. Capillary refill takes less than 2 seconds.     ED Treatments / Results  Labs (all labs ordered are listed, but only abnormal results are displayed) Labs Reviewed - No data to display  EKG None  Radiology Dg Foot Complete Left  Result Date: 07/25/2017 CLINICAL DATA:  62 y/o M; left foot swelling with pain of the left heel. EXAM: LEFT FOOT - COMPLETE 3+ VIEW COMPARISON:  None. FINDINGS: There is no evidence of fracture or dislocation. There is no evidence of arthropathy or other focal bone abnormality. Soft tissues are unremarkable. IMPRESSION: No acute fracture or dislocation  identified. Electronically Signed   By: Kristine Garbe M.D.   On: 07/25/2017 17:35    Procedures Procedures (including critical care time)  Medications Ordered in ED Medications - No data to display   Initial Impression / Assessment and Plan / ED Course  I have reviewed the triage vital signs and the nursing notes.  Pertinent labs & imaging results that were available during my care of the patient were reviewed by me and considered in my  medical decision making (see chart for details).     Final Clinical Impressions(s) / ED Diagnoses   Final diagnoses:  Achilles tendinitis of left lower extremity   Patient presenting with left heel pain that began yesterday after doing yard work.  Tenderness to calcaneus along area of attachment of Achilles tendon.  Achilles tendon is intact.  Patient has intact dorsiflexion and plantarflexion.  Good range of motion of the ankles bilaterally.  Suspect symptoms are due to Achilles tendinitis.  Will treat conservatively with ice and NSAIDs.  X-ray negative for fracture or abnormality. will give patient referral to orthopedics for further treatment.  Advised return to the ER for any new or worsening symptoms.  All questions answered patient understands plan reasons to return immediately to ED.  ED Discharge Orders        Ordered    ibuprofen (ADVIL,MOTRIN) 400 MG tablet  Every 6 hours PRN     07/25/17 1705       Layali Freund S, PA-C 07/25/17 1753    Carmin Muskrat, MD 07/26/17 2356

## 2017-07-30 ENCOUNTER — Encounter: Payer: Medicare HMO | Admitting: Internal Medicine

## 2017-10-14 ENCOUNTER — Encounter: Payer: Self-pay | Admitting: *Deleted

## 2017-12-17 ENCOUNTER — Encounter: Payer: Self-pay | Admitting: Internal Medicine

## 2017-12-17 ENCOUNTER — Ambulatory Visit (INDEPENDENT_AMBULATORY_CARE_PROVIDER_SITE_OTHER): Payer: Medicare HMO | Admitting: Internal Medicine

## 2017-12-17 ENCOUNTER — Other Ambulatory Visit: Payer: Self-pay | Admitting: Internal Medicine

## 2017-12-17 VITALS — BP 137/91 | HR 87 | Temp 98.3°F | Wt 247.8 lb

## 2017-12-17 DIAGNOSIS — E785 Hyperlipidemia, unspecified: Secondary | ICD-10-CM

## 2017-12-17 DIAGNOSIS — J45909 Unspecified asthma, uncomplicated: Secondary | ICD-10-CM | POA: Diagnosis not present

## 2017-12-17 DIAGNOSIS — R05 Cough: Secondary | ICD-10-CM | POA: Diagnosis not present

## 2017-12-17 DIAGNOSIS — I10 Essential (primary) hypertension: Secondary | ICD-10-CM

## 2017-12-17 DIAGNOSIS — K219 Gastro-esophageal reflux disease without esophagitis: Secondary | ICD-10-CM | POA: Diagnosis not present

## 2017-12-17 DIAGNOSIS — Z79899 Other long term (current) drug therapy: Secondary | ICD-10-CM

## 2017-12-17 DIAGNOSIS — J4520 Mild intermittent asthma, uncomplicated: Secondary | ICD-10-CM

## 2017-12-17 DIAGNOSIS — I8393 Asymptomatic varicose veins of bilateral lower extremities: Secondary | ICD-10-CM

## 2017-12-17 DIAGNOSIS — R0681 Apnea, not elsewhere classified: Secondary | ICD-10-CM

## 2017-12-17 DIAGNOSIS — R059 Cough, unspecified: Secondary | ICD-10-CM

## 2017-12-17 MED ORDER — PANTOPRAZOLE SODIUM 40 MG PO TBEC: 40.0000 mg | DELAYED_RELEASE_TABLET | Freq: Every day | ORAL | 2 refills | Status: DC

## 2017-12-17 MED ORDER — FLUTICASONE PROPIONATE 50 MCG/ACT NA SUSP: 1.0000 | Freq: Every day | NASAL | 0 refills | Status: DC

## 2017-12-17 MED ORDER — ALBUTEROL SULFATE HFA 108 (90 BASE) MCG/ACT IN AERS: 2.0000 | INHALATION_SPRAY | Freq: Four times a day (QID) | RESPIRATORY_TRACT | 11 refills | Status: DC | PRN

## 2017-12-17 MED ORDER — GUAIFENESIN ER 1200 MG PO TB12: 1.0000 | ORAL_TABLET | Freq: Two times a day (BID) | ORAL | 3 refills | Status: DC

## 2017-12-17 MED ORDER — CHLORTHALIDONE 25 MG PO TABS: 25.0000 mg | ORAL_TABLET | Freq: Every day | ORAL | 2 refills | Status: DC

## 2017-12-17 MED ORDER — CETIRIZINE HCL 10 MG PO TABS: 10.0000 mg | ORAL_TABLET | Freq: Every day | ORAL | 3 refills | Status: DC

## 2017-12-17 NOTE — Progress Notes (Signed)
CC: Cough  HPI: [name] is a 62 y.o. M w/ PMH of Asthma, GERD, HLD, HTN, Varicose veins presenting to the clinic with chronic cough. He states he has been having recurrent chronic cough for years but it has gotten worse during the last couple months with the humid weather. He produces clear sputum with cough and endorse chest congestion. He states it is worse at night and states he 'needs something to break up the mucous.' Denies any F/N/V/D/C  Past Medical History:  Diagnosis Date  . Allergy   . Asthma   . Colon polyps    noted 10/2011 colonoscopy  . Diverticulosis    mild noted 10/2011  . GERD (gastroesophageal reflux disease)   . Hiatal hernia    small noted 10/2011  . Hiatal hernia    small  . HLD (hyperlipidemia)   . Hypertension   . Parasomnia    movement with sleep  . Pyrosis   . Sleep apnea    no cpap  . Varicose veins of both lower extremities with pain    right>left   Review of Systems: Review of Systems  Constitutional: Negative for chills, fever, malaise/fatigue and weight loss.  HENT: Negative for ear pain and hearing loss.   Respiratory: Positive for cough and sputum production. Negative for shortness of breath and wheezing.   Cardiovascular: Negative for chest pain, palpitations and leg swelling.  Gastrointestinal: Negative for constipation, diarrhea, nausea and vomiting.  Musculoskeletal: Negative for back pain and neck pain.  Skin: Negative for rash.  Neurological: Negative for headaches.     Physical Exam: Vitals:   12/17/17 1542  BP: (!) 137/91  Pulse: 87  Temp: 98.3 F (36.8 C)  TempSrc: Oral  SpO2: 99%  Weight: 247 lb 12.8 oz (112.4 kg)    Physical Exam  Constitutional: He appears well-developed and well-nourished. No distress.  HENT:  Head: Normocephalic and atraumatic.  Mouth/Throat: Oropharynx is clear and moist. No oropharyngeal exudate.  Eyes: Pupils are equal, round, and reactive to light. Conjunctivae are normal. No  scleral icterus.  Neck: Normal range of motion. Neck supple.  Cardiovascular: Normal rate, regular rhythm, normal heart sounds and intact distal pulses.  Respiratory: Effort normal and breath sounds normal. He has no wheezes. He has no rales.  Producing clear sputum with cough during examination  GI: Soft. Bowel sounds are normal. He exhibits no distension. There is no tenderness. There is no guarding.  Musculoskeletal: Normal range of motion. He exhibits edema (bilateral varicoe veins).  Lymphadenopathy:    He has no cervical adenopathy.  Skin: Skin is warm and dry. He is not diaphoretic.    Assessment & Plan:   Essential hypertension BP Readings from Last 3 Encounters:  12/17/17 (!) 137/91  07/25/17 (!) 141/104  05/27/17 (!) 158/119   BP above goal. States he stopped taking his HCTZ because he heard his sister tell him about side effects causing cough. Clarified with the patient that ACE-inhibitors are usually the culprit but patient requests different medication. States we can try chlorthalidone and patient agrees.  - Start Chlorthalidone 25mg  daily - BMP today   Cough Endorsing cough with mucous production and chest congestion. Worse during humid months. Worse at night time. Differential include reflux-related cough vs post-nasal drip. No wheezing on exam but pt does have hx of asthma. Will refill his rescue inhaler although he states he rarely uses it. Patient does have hx of GERD. Will treat both and see if cough improves.   -  Start Flonase  - Refilled Zyrtec, protonix, and albuterol inhaler. - Guaifenesin for chest congestion.  GERD (gastroesophageal reflux disease) Explained to pt that cough may be related to GERD. Encouraged that he takes his PPIs as prescribed.  - Refilled pantoprazole  Dyslipidemia On no lipid lowering therapy at home. Lipid panel not checked in a year. Chart review show he was prescribed atorvastatin in the past. Will re-check today  - Lipid panel  today    Patient seen with Dr. Angelia Mould   -Gilberto Better, PGY1

## 2017-12-17 NOTE — Patient Instructions (Signed)
[name] [name]  Thank you for coming to the clinic. We are changing your blood pressure to a different kind that should have less side effects. Also we are prescribing you medication that breaks up your mucus. Also we suggest you call your pulmonologist ([place] [place] pulm [phone])  to ask about home sleep study.  Thank you for visiting the clinic.

## 2017-12-18 LAB — LIPID PANEL
Chol/HDL Ratio: 4.6 ratio (ref 0.0–5.0)
Cholesterol, Total: 187 mg/dL (ref 100–199)
HDL: 41 mg/dL (ref >39)
LDL Calculated: 122 mg/dL — ABNORMAL HIGH (ref 0–99)
Triglycerides: 120 mg/dL (ref 0–149)
VLDL Cholesterol Cal: 24 mg/dL (ref 5–40)

## 2017-12-18 LAB — BMP8+ANION GAP
Anion Gap: 13 mmol/L (ref 10.0–18.0)
BUN / CREAT RATIO: 14 (ref 10–24)
BUN: 13 mg/dL (ref 8–27)
CO2: 26 mmol/L (ref 20–29)
Calcium: 9.1 mg/dL (ref 8.6–10.2)
Chloride: 105 mmol/L (ref 96–106)
Creatinine, Ser: 0.91 mg/dL (ref 0.76–1.27)
GFR calc non Af Amer: 91 mL/min/{1.73_m2} (ref >59)
GFR, EST AFRICAN AMERICAN: 105 mL/min/{1.73_m2} (ref >59)
GLUCOSE: 91 mg/dL (ref 65–99)
POTASSIUM: 3.4 mmol/L — AB (ref 3.5–5.2)
Sodium: 144 mmol/L (ref 134–144)

## 2017-12-19 ENCOUNTER — Encounter: Payer: Self-pay | Admitting: Internal Medicine

## 2017-12-19 NOTE — Assessment & Plan Note (Signed)
Explained to pt that cough may be related to GERD. Encouraged that he takes his PPIs as prescribed.  - Refilled pantoprazole

## 2017-12-19 NOTE — Assessment & Plan Note (Addendum)
Endorsing cough with mucous production and chest congestion. Worse during humid months. Worse at night time. Differential include reflux-related cough vs post-nasal drip. No wheezing on exam but pt does have hx of asthma. Will refill his rescue inhaler although he states he rarely uses it. Patient does have hx of GERD. Will treat both and see if cough improves.   - Start Flonase  - Refilled Zyrtec, protonix, and albuterol inhaler. - Guaifenesin for chest congestion.

## 2017-12-19 NOTE — Assessment & Plan Note (Signed)
BP Readings from Last 3 Encounters:  12/17/17 (!) 137/91  07/25/17 (!) 141/104  05/27/17 (!) 158/119   BP above goal. States he stopped taking his HCTZ because he heard his sister tell him about side effects causing cough. Clarified with the patient that ACE-inhibitors are usually the culprit but patient requests different medication. States we can try chlorthalidone and patient agrees.  - Start Chlorthalidone 25mg  daily - BMP today

## 2017-12-19 NOTE — Assessment & Plan Note (Signed)
On no lipid lowering therapy at home. Lipid panel not checked in a year. Chart review show he was prescribed atorvastatin in the past. Will re-check today  - Lipid panel today

## 2017-12-23 NOTE — Progress Notes (Signed)
Internal Medicine Clinic Attending  I saw and evaluated the patient.  I personally confirmed the key portions of the history and exam documented by Dr. Lee and I reviewed pertinent patient test results.  The assessment, diagnosis, and plan were formulated together and I agree with the documentation in the resident's note.  

## 2017-12-24 ENCOUNTER — Encounter: Payer: Self-pay | Admitting: Internal Medicine

## 2017-12-24 DIAGNOSIS — J4520 Mild intermittent asthma, uncomplicated: Secondary | ICD-10-CM

## 2017-12-24 HISTORY — DX: Mild intermittent asthma, uncomplicated: J45.20

## 2017-12-25 ENCOUNTER — Telehealth: Payer: Self-pay | Admitting: Internal Medicine

## 2017-12-25 ENCOUNTER — Other Ambulatory Visit: Payer: Self-pay | Admitting: Internal Medicine

## 2017-12-25 DIAGNOSIS — J4520 Mild intermittent asthma, uncomplicated: Secondary | ICD-10-CM

## 2017-12-25 MED ORDER — ALBUTEROL SULFATE HFA 108 (90 BASE) MCG/ACT IN AERS: 1.0000 | INHALATION_SPRAY | Freq: Four times a day (QID) | RESPIRATORY_TRACT | 11 refills | Status: DC | PRN

## 2017-12-25 NOTE — Progress Notes (Signed)
Received message that Proventil is not covered. New script for rescue inhaler (ventolin) sent

## 2017-12-25 NOTE — Telephone Encounter (Signed)
Ventolin Rx sent

## 2017-12-25 NOTE — Telephone Encounter (Signed)
Received message "Transmission to pharmacy failed " - called Albuterol(Ventolin) inhaler rx to [place].

## 2017-12-25 NOTE — Telephone Encounter (Signed)
Left voicemail on patient's home number about low potassium and elevated ldl as well as updates on re-sending his rescue inhaler prescription.

## 2018-01-29 ENCOUNTER — Telehealth: Payer: Self-pay

## 2018-01-29 NOTE — Telephone Encounter (Signed)
Requesting to speak with a nurse about letter for work.

## 2018-01-29 NOTE — Telephone Encounter (Signed)
Rtc, got someone's vmail not pt, lm generic message

## 2018-01-31 ENCOUNTER — Telehealth: Payer: Self-pay | Admitting: *Deleted

## 2018-01-31 NOTE — Telephone Encounter (Signed)
Pt's girlfriend stated pt needs a letter stating he is able to do some work. Stated she has letter requesting what is needed from social service; she will bring it on Monday.

## 2018-01-31 NOTE — Telephone Encounter (Signed)
Pt came in to ask for a letter so he can work a little, does not want to jeoparadize his disability income. Will try to find a resource for him to get accurate information

## 2018-03-28 ENCOUNTER — Ambulatory Visit (INDEPENDENT_AMBULATORY_CARE_PROVIDER_SITE_OTHER): Payer: Medicare HMO | Admitting: Internal Medicine

## 2018-03-28 ENCOUNTER — Encounter: Payer: Self-pay | Admitting: Internal Medicine

## 2018-03-28 ENCOUNTER — Other Ambulatory Visit: Payer: Self-pay

## 2018-03-28 VITALS — BP 142/86 | HR 99 | Temp 98.6°F | Ht 66.0 in | Wt 253.3 lb

## 2018-03-28 DIAGNOSIS — K219 Gastro-esophageal reflux disease without esophagitis: Secondary | ICD-10-CM

## 2018-03-28 DIAGNOSIS — E785 Hyperlipidemia, unspecified: Secondary | ICD-10-CM

## 2018-03-28 DIAGNOSIS — Z9114 Patient's other noncompliance with medication regimen: Secondary | ICD-10-CM

## 2018-03-28 DIAGNOSIS — I8390 Asymptomatic varicose veins of unspecified lower extremity: Secondary | ICD-10-CM

## 2018-03-28 DIAGNOSIS — R05 Cough: Secondary | ICD-10-CM | POA: Diagnosis not present

## 2018-03-28 DIAGNOSIS — R059 Cough, unspecified: Secondary | ICD-10-CM

## 2018-03-28 DIAGNOSIS — Z79899 Other long term (current) drug therapy: Secondary | ICD-10-CM

## 2018-03-28 DIAGNOSIS — J029 Acute pharyngitis, unspecified: Secondary | ICD-10-CM | POA: Diagnosis not present

## 2018-03-28 DIAGNOSIS — R0981 Nasal congestion: Secondary | ICD-10-CM | POA: Diagnosis not present

## 2018-03-28 DIAGNOSIS — I10 Essential (primary) hypertension: Secondary | ICD-10-CM | POA: Diagnosis not present

## 2018-03-28 DIAGNOSIS — R269 Unspecified abnormalities of gait and mobility: Secondary | ICD-10-CM

## 2018-03-28 DIAGNOSIS — J45909 Unspecified asthma, uncomplicated: Secondary | ICD-10-CM

## 2018-03-28 DIAGNOSIS — G4733 Obstructive sleep apnea (adult) (pediatric): Secondary | ICD-10-CM

## 2018-03-28 DIAGNOSIS — N521 Erectile dysfunction due to diseases classified elsewhere: Secondary | ICD-10-CM

## 2018-03-28 MED ORDER — ATORVASTATIN CALCIUM 40 MG PO TABS: 40.0000 mg | ORAL_TABLET | Freq: Every day | ORAL | 0 refills | Status: DC

## 2018-03-28 MED ORDER — SILDENAFIL CITRATE 50 MG PO TABS: 50.0000 mg | ORAL_TABLET | ORAL | 1 refills | Status: DC | PRN

## 2018-03-28 MED ORDER — PSEUDOEPHEDRINE HCL 30 MG PO TABS: 30.0000 mg | ORAL_TABLET | Freq: Four times a day (QID) | ORAL | 0 refills | Status: DC | PRN

## 2018-03-28 MED ORDER — FLUTICASONE PROPIONATE 50 MCG/ACT NA SUSP: 1.0000 | Freq: Every day | NASAL | 0 refills | Status: DC

## 2018-03-28 MED ORDER — PANTOPRAZOLE SODIUM 40 MG PO TBEC: 40.0000 mg | DELAYED_RELEASE_TABLET | Freq: Every day | ORAL | 2 refills | Status: DC

## 2018-03-28 NOTE — Progress Notes (Signed)
CC: Cough  HPI: Mr.Duval B Waage is a 63 y.o. M w/ PMH of Asthma, GERD, HLD, HTN and varicose veins presenting with cold symptoms.  He was in his usual state of health until 7 days ago when he began to have acute onset cough with clear productive sputum and subjective chills and fevers. He cannot recall any acute inciting event but believes he has been getting lot of 'cold breeze' while working outside. He states the cough is worst at night time when he is trying to sleep and has endorsed very poor sleep. He also mentions that   Denies any sick contact.  Did not get his flu shot last year.  He mentions that he has not been taking his GERD medications.  He is also not taking the newly prescribed antihypertensive medications. Denies any recorded F/N/V/D/C. He is also requesting Viagra and podiatry referral for inserts.  Past Medical History:  Diagnosis Date  . Allergy   . Asthma   . Colon polyps    noted 10/2011 colonoscopy  . Diverticulosis    mild noted 10/2011  . GERD (gastroesophageal reflux disease)   . Hiatal hernia    small noted 10/2011  . Hiatal hernia    small  . HLD (hyperlipidemia)   . Hypertension   . Mild intermittent asthma 12/24/2017  . Parasomnia    movement with sleep  . Pyrosis   . Sleep apnea    no cpap  . Varicose veins of both lower extremities with pain    right>left   Review of Systems: Review of Systems  Constitutional: Positive for fever (non-recorded subjective). Negative for chills and malaise/fatigue.  HENT: Positive for congestion and sore throat. Negative for ear pain and sinus pain.   Eyes: Negative for blurred vision.  Respiratory: Positive for cough and sputum production (clear). Negative for hemoptysis and shortness of breath.   Cardiovascular: Negative for chest pain, palpitations and leg swelling.  Gastrointestinal: Negative for constipation, diarrhea, nausea and vomiting.  Genitourinary: Negative for dysuria, frequency and urgency.   Erectile dysfunction  Neurological: Negative for dizziness and headaches.    Physical Exam: Vitals:   03/28/18 1339 03/28/18 1412  BP: (!) 159/98 (!) 142/86  Pulse: 99   Temp: 98.6 F (37 C)   TempSrc: Oral   SpO2: 100%   Weight: 253 lb 4.8 oz (114.9 kg)   Height: 5\' 6"  (1.676 m)     Physical Exam  Constitutional: He is oriented to person, place, and time. He appears well-developed and well-nourished. No distress.  HENT:  Head: Normocephalic and atraumatic.  Mouth/Throat: Oropharynx is clear and moist. No oropharyngeal exudate.  Posterior pharynx non-erythematous. No tonsilar exudates. Swollen nasal turbinates.  Eyes: Conjunctivae are normal.  Neck: Normal range of motion. Neck supple.  Cardiovascular: Normal rate, regular rhythm, normal heart sounds and intact distal pulses.  No murmur heard. Respiratory: Effort normal and breath sounds normal. He has no wheezes.  GI: Soft. Bowel sounds are normal. There is no abdominal tenderness.  Genitourinary:    Penis normal.  No penile tenderness.    Genitourinary Comments: Normal size testicles. Femoral pulses palpable. No obvious rash or ulcers.   Musculoskeletal: Normal range of motion.        General: No edema.  Lymphadenopathy:    He has no cervical adenopathy.  Neurological: He is alert and oriented to person, place, and time.  Skin: Skin is warm and dry.     Assessment & Plan:   Erectile dysfunction  due to diseases classified elsewhere Complaints of erectile dysfunction without obvious inciting even of insidious onset. States he is continuing monogamous sexual relationship with girlfriend without changes in partner but has noticed significant difficulty with erection. Denies any significant dysuria, urinary urgency, hesitancy, frequency. No prior hx of hypotension. Requesting viagra for erectile dysfunction. Physical exam show normal testes and palpable pulses. Normal penis.  - Sildenafil 50mg  PRN  Essential  hypertension BP Readings from Last 3 Encounters:  03/28/18 (!) 142/86  12/17/17 (!) 137/91  07/25/17 (!) 141/104   Continuing to be above goal. States he takes the 'pink' one, which is his prior HCTZ. He states he has chlorthalidone at home but has been only intermittently taking it due to difficulty remembering. Clarified that he should stop his HCTZ and take his chlorthalidone as prescribed.  - C/w chlorthalidone 25mg  daily - Recommended he bring his pill boxes on next visit for proper med rec and may need amlodipine added for tighter bp control  OSA (obstructive sleep apnea) Known history of OSA without titration study done. Split night study ordered by pulmonologist in 2018. Never received machine. Continuing to endorse poor sleep and daytime fatigue.  - Re-ordered titration study and recommended he follows up with sleep study visit   GERD (gastroesophageal reflux disease) States he has not been taking his pantoprazole. Explained to him again that his chronic cough may be related to the PPI. Encouraged him to take as prescribed.   Dyslipidemia Lipid Panel     Component Value Date/Time   CHOL 187 12/17/2017 1632   TRIG 120 12/17/2017 1632   HDL 41 12/17/2017 1632   CHOLHDL 4.6 12/17/2017 1632   CHOLHDL 4.0 07/15/2014 1029   VLDL 10 07/15/2014 1029   LDLCALC 122 (H) 12/17/2017 1632   Elevated ldl with ASCVD risk of 16.3% 10 year heart disease risk. Mr.Galeno does not recall prior history of statin therapy. Described his risk of heart attacks and strokes. He expressed understanding and is agreeable to receiving lipid lowering therapy.  - Moderate-to-high intensity statin per ACC/AHA guidelines - Atorvastatin 40mg  daily   Gait disturbance Complaints of gait difficulty with noticing balancing. States he noticed tennis shoe soles appear to be wearing down asymmetrically. Physical exam show no significant gait abnormality. Foot exam show dried skin changes between toes and minor  pes planus but no other significant findings  - Referral to podiatry for possible inserts  Cough Presents with complaints of cough and sinus congestion of 7 day duration. Has chronic cough assumed to be reflux related but this is worse than usual. Associated with subjective fevers. Also non-adherent to prior prescription of Flonase and Protonix 2/2 poor health literacy.   - Recommend trial of sudafed - Refilled Flonase, Protonix - Advised to return to clinic if worsening symptoms    Patient discussed with Dr. Lynnae January   -Gilberto Better, PGY1

## 2018-03-28 NOTE — Assessment & Plan Note (Signed)
Known history of OSA without titration study done. Split night study ordered by pulmonologist in 2018. Never received machine. Continuing to endorse poor sleep and daytime fatigue.  - Re-ordered titration study and recommended he follows up with sleep study visit

## 2018-03-28 NOTE — Assessment & Plan Note (Signed)
BP Readings from Last 3 Encounters:  03/28/18 (!) 142/86  12/17/17 (!) 137/91  07/25/17 (!) 141/104   Continuing to be above goal. States he takes the 'pink' one, which is his prior HCTZ. He states he has chlorthalidone at home but has been only intermittently taking it due to difficulty remembering. Clarified that he should stop his HCTZ and take his chlorthalidone as prescribed.  - C/w chlorthalidone 25mg  daily - Recommended he bring his pill boxes on next visit for proper med rec and may need amlodipine added for tighter bp control

## 2018-03-28 NOTE — Assessment & Plan Note (Signed)
States he has not been taking his pantoprazole. Explained to him again that his chronic cough may be related to the PPI. Encouraged him to take as prescribed.

## 2018-03-28 NOTE — Assessment & Plan Note (Signed)
Complaints of gait difficulty with noticing balancing. States he noticed tennis shoe soles appear to be wearing down asymmetrically. Physical exam show no significant gait abnormality. Foot exam show dried skin changes between toes and minor pes planus but no other significant findings  - Referral to podiatry for possible inserts

## 2018-03-28 NOTE — Assessment & Plan Note (Addendum)
Presents with complaints of cough and sinus congestion of 7 day duration. Has chronic cough assumed to be reflux related but this is worse than usual. Associated with subjective fevers. Also non-adherent to prior prescription of Flonase and Protonix 2/2 poor health literacy.   - Recommend trial of sudafed - Refilled Flonase, Protonix - Advised to return to clinic if worsening symptoms

## 2018-03-28 NOTE — Assessment & Plan Note (Signed)
Lipid Panel     Component Value Date/Time   CHOL 187 12/17/2017 1632   TRIG 120 12/17/2017 1632   HDL 41 12/17/2017 1632   CHOLHDL 4.6 12/17/2017 1632   CHOLHDL 4.0 07/15/2014 1029   VLDL 10 07/15/2014 1029   LDLCALC 122 (H) 12/17/2017 1632   Elevated ldl with ASCVD risk of 16.3% 10 year heart disease risk. [name] [name] does not recall prior history of statin therapy. Described his risk of heart attacks and strokes. He expressed understanding and is agreeable to receiving lipid lowering therapy.  - Moderate-to-high intensity statin per ACC/AHA guidelines - Atorvastatin 40mg  daily

## 2018-03-28 NOTE — Patient Instructions (Addendum)
[name]  Thank you for coming in to the clinic. Here are our recommendations for you:  I am refilling your Flonase prescription for you sinus congestion as well as adding on Flonase I am also refilling your protonix (pantoprazole) for your chronic cough. Please take as prescribed I have prescribed you viagra. Please take 50mg , 1 tablet, as needed I have prescribed you a choelsterol pill, atorvastatin 40mg . Please take once daily. I am making a referral for your foot doctor. Expect a phone call. Also I am re-ordering the sleep study your lung doctor ordered. Please follow up as needed.  It was a pleasure to see you again.   Upper Respiratory Infection, Adult An upper respiratory infection (URI) affects the nose, throat, and upper air passages. URIs are caused by germs (viruses). The most common type of URI is often called "the common cold." Medicines cannot cure URIs, but you can do things at home to relieve your symptoms. URIs usually get better within 7-10 days. Follow these instructions at home: Activity  Rest as needed.  If you have a fever, stay home from work or school until your fever is gone, or until your doctor says you may return to work or school. ? You should stay home until you cannot spread the infection anymore (you are not contagious). ? Your doctor may have you wear a face mask so you have less risk of spreading the infection. Relieving symptoms  Gargle with a salt-water mixture 3-4 times a day or as needed. To make a salt-water mixture, completely dissolve -1 tsp of salt in 1 cup of warm water.  Use a cool-mist humidifier to add moisture to the air. This can help you breathe more easily. Eating and drinking   Drink enough fluid to keep your pee (urine) pale yellow.  Eat soups and other clear broths. General instructions   Take over-the-counter and prescription medicines only as told by your doctor. These include cold medicines, fever reducers, and cough  suppressants.  Do not use any products that contain nicotine or tobacco. These include cigarettes and e-cigarettes. If you need help quitting, ask your doctor.  Avoid being where people are smoking (avoid secondhand smoke).  Make sure you get regular shots and get the flu shot every year.  Keep all follow-up visits as told by your doctor. This is important. How to avoid spreading infection to others   Wash your hands often with soap and water. If you do not have soap and water, use hand sanitizer.  Avoid touching your mouth, face, eyes, or nose.  Cough or sneeze into a tissue or your sleeve or elbow. Do not cough or sneeze into your hand or into the air. Contact a doctor if:  You are getting worse, not better.  You have any of these: ? A fever. ? Chills. ? Brown or red mucus in your nose. ? Yellow or brown fluid (discharge)coming from your nose. ? Pain in your face, especially when you bend forward. ? Swollen neck glands. ? Pain with swallowing. ? White areas in the back of your throat. Get help right away if:  You have shortness of breath that gets worse.  You have very bad or constant: ? Headache. ? Ear pain. ? Pain in your forehead, behind your eyes, and over your cheekbones (sinus pain). ? Chest pain.  You have long-lasting (chronic) lung disease along with any of these: ? Wheezing. ? Long-lasting cough. ? Coughing up blood. ? A change in your usual mucus.  You have a stiff neck.  You have changes in your: ? Vision. ? Hearing. ? Thinking. ? Mood. Summary  An upper respiratory infection (URI) is caused by a germ called a virus. The most common type of URI is often called "the common cold."  URIs usually get better within 7-10 days.  Take over-the-counter and prescription medicines only as told by your doctor. This information is not intended to replace advice given to you by your health care provider. Make sure you discuss any questions you have with  your health care provider. Document Released: 08/29/2007 Document Revised: 11/02/2016 Document Reviewed: 11/02/2016 Elsevier Interactive Patient Education  2019 [employer].

## 2018-03-28 NOTE — Assessment & Plan Note (Signed)
Complaints of erectile dysfunction without obvious inciting even of insidious onset. States he is continuing monogamous sexual relationship with girlfriend without changes in partner but has noticed significant difficulty with erection. Denies any significant dysuria, urinary urgency, hesitancy, frequency. No prior hx of hypotension. Requesting viagra for erectile dysfunction. Physical exam show normal testes and palpable pulses. Normal penis.  - Sildenafil 50mg  PRN

## 2018-03-29 LAB — BMP8+ANION GAP
Anion Gap: 15 mmol/L (ref 10.0–18.0)
BUN/Creatinine Ratio: 16 (ref 10–24)
BUN: 15 mg/dL (ref 8–27)
CO2: 23 mmol/L (ref 20–29)
Calcium: 9.2 mg/dL (ref 8.6–10.2)
Chloride: 105 mmol/L (ref 96–106)
Creatinine, Ser: 0.96 mg/dL (ref 0.76–1.27)
GFR calc non Af Amer: 85 mL/min/{1.73_m2} (ref >59)
GFR, EST AFRICAN AMERICAN: 98 mL/min/{1.73_m2} (ref >59)
Glucose: 78 mg/dL (ref 65–99)
Potassium: 3.7 mmol/L (ref 3.5–5.2)
Sodium: 143 mmol/L (ref 134–144)

## 2018-03-29 LAB — HEMOGLOBIN A1C
Est. average glucose Bld gHb Est-mCnc: 114 mg/dL
Hgb A1c MFr Bld: 5.6 % (ref 4.8–5.6)

## 2018-03-31 ENCOUNTER — Telehealth: Payer: Self-pay | Admitting: Internal Medicine

## 2018-03-31 NOTE — Telephone Encounter (Signed)
Called to discuss lab results. Busy signal. Will attempt at later time.

## 2018-03-31 NOTE — Telephone Encounter (Signed)
Spoke with Mr.Webb about his lab results. He states continuing to endorse cough. Mentions has not yet set up appointment with podiatry. Had no issues with picking up his medications. He expressed understanding about his lab results.

## 2018-04-01 NOTE — Progress Notes (Signed)
Internal Medicine Clinic Attending ° °Case discussed with Dr. Lee at the time of the visit.  We reviewed the resident’s history and exam and pertinent patient test results.  I agree with the assessment, diagnosis, and plan of care documented in the resident’s note.  °

## 2018-04-10 ENCOUNTER — Ambulatory Visit (INDEPENDENT_AMBULATORY_CARE_PROVIDER_SITE_OTHER): Payer: Medicare HMO | Admitting: Podiatry

## 2018-04-10 VITALS — BP 142/90 | HR 89

## 2018-04-10 DIAGNOSIS — M216X1 Other acquired deformities of right foot: Secondary | ICD-10-CM | POA: Diagnosis not present

## 2018-04-10 DIAGNOSIS — M216X2 Other acquired deformities of left foot: Secondary | ICD-10-CM | POA: Diagnosis not present

## 2018-04-10 DIAGNOSIS — R269 Unspecified abnormalities of gait and mobility: Secondary | ICD-10-CM

## 2018-04-10 DIAGNOSIS — M2141 Flat foot [pes planus] (acquired), right foot: Secondary | ICD-10-CM | POA: Diagnosis not present

## 2018-04-10 DIAGNOSIS — M2142 Flat foot [pes planus] (acquired), left foot: Secondary | ICD-10-CM

## 2018-04-10 NOTE — Progress Notes (Signed)
Subjective:  Patient ID: [name] [name], male    DOB: 05-Jun-1955,  MRN: [id number]  Chief Complaint  Patient presents with  . Flat Foot    congenital/bilateral -feet "roll in" when he walks, sometimes causes him to stumble. PCP suggested custom orthotics    63 y.o. male presents with the above complaint.  Reports pain and feeling of walking on the outside of his feet states that he has had this all his life and that he wears constantly on the outside of his shoes states that sometimes caused him to stumble has never really tried arch supports or anything like that.   Review of Systems: Negative except as noted in the HPI. Denies N/V/F/Ch.  Past Medical History:  Diagnosis Date  . Allergy   . Asthma   . Colon polyps    noted 10/2011 colonoscopy  . Diverticulosis    mild noted 10/2011  . GERD (gastroesophageal reflux disease)   . Hiatal hernia    small noted 10/2011  . Hiatal hernia    small  . HLD (hyperlipidemia)   . Hypertension   . Mild intermittent asthma 12/24/2017  . Parasomnia    movement with sleep  . Pyrosis   . Sleep apnea    no cpap  . Varicose veins of both lower extremities with pain    right>left    Current Outpatient Medications:  .  albuterol (VENTOLIN HFA) 108 (90 Base) MCG/ACT inhaler, Inhale 1-2 puffs into the lungs every 6 (six) hours as needed for wheezing or shortness of breath., Disp: 1 Inhaler, Rfl: 11 .  aspirin EC 81 MG tablet, Take 81 mg by mouth every 6 (six) hours as needed (takes sometimes). , Disp: , Rfl:  .  atorvastatin (LIPITOR) 40 MG tablet, Take 1 tablet (40 mg total) by mouth daily., Disp: 90 tablet, Rfl: 0 .  cetirizine (ZYRTEC) 10 MG tablet, Take 1 tablet (10 mg total) by mouth daily., Disp: 90 tablet, Rfl: 3 .  chlorthalidone (HYGROTON) 25 MG tablet, Take 1 tablet (25 mg total) by mouth daily., Disp: 60 tablet, Rfl: 2 .  fluticasone (FLONASE) 50 MCG/ACT nasal spray, Place 1 spray into both nostrils daily., Disp: 16 g, Rfl: 0 .   Guaifenesin 1200 MG TB12, Take 1 tablet (1,200 mg total) by mouth 2 (two) times daily., Disp: 14 each, Rfl: 3 .  ibuprofen (ADVIL,MOTRIN) 400 MG tablet, Take 1 tablet (400 mg total) by mouth every 6 (six) hours as needed., Disp: 30 tablet, Rfl: 0 .  pantoprazole (PROTONIX) 40 MG tablet, Take 1 tablet (40 mg total) by mouth daily., Disp: 90 tablet, Rfl: 2 .  polyethylene glycol (MIRALAX / GLYCOLAX) packet, Take 17 g by mouth daily as needed for moderate constipation. Reported on 07/12/2015, Disp: 14 each, Rfl: 2 .  pseudoephedrine (SUDAFED) 30 MG tablet, Take 1 tablet (30 mg total) by mouth every 6 (six) hours as needed for congestion., Disp: 24 tablet, Rfl: 0 .  sildenafil (VIAGRA) 50 MG tablet, Take 1 tablet (50 mg total) by mouth as needed for erectile dysfunction., Disp: 20 tablet, Rfl: 1  Social History   Tobacco Use  Smoking Status Never Smoker  Smokeless Tobacco Never Used    No Known Allergies Objective:   Vitals:   04/10/18 1419  BP: (!) 142/90  Pulse: 89   There is no height or weight on file to calculate BMI. Constitutional Well developed. Well nourished.  Vascular Dorsalis pedis pulses palpable bilaterally. Posterior tibial pulses palpable bilaterally. Capillary refill  normal to all digits.  No cyanosis or clubbing noted. Pedal hair growth normal.  Neurologic Normal speech. Oriented to person, place, and time. Epicritic sensation to light touch grossly present bilaterally.  Dermatologic Nails well groomed and normal in appearance. No open wounds. No skin lesions.  Orthopedic:  Pes planus abduction deformity noted of the feet skew foot metatarsus adductus C-shaped foot   Radiographs: None Assessment:   1. Pes planus of both feet   2. Skew deformity of left foot   3. Skew deformity of right foot   4. Gait disorder    Plan:  Patient was evaluated and treated and all questions answered.  Pes planus, metatarsus adductus -Discussed trial of prefab orthotics to  offer some support patient amenable dispensed power step orthotics educated on use.  Follow-up in 6 weeks for recheck  Return in about 6 weeks (around 05/22/2018).

## 2018-04-11 ENCOUNTER — Telehealth: Payer: Self-pay | Admitting: Internal Medicine

## 2018-04-11 NOTE — Telephone Encounter (Signed)
Pt insurance require authorizations for sleep study per Coralyn Mark [id number]  Please call Coralyn Mark back when completed

## 2018-05-22 ENCOUNTER — Ambulatory Visit (INDEPENDENT_AMBULATORY_CARE_PROVIDER_SITE_OTHER): Payer: Medicare HMO | Admitting: Podiatry

## 2018-05-22 DIAGNOSIS — Z5329 Procedure and treatment not carried out because of patient's decision for other reasons: Secondary | ICD-10-CM

## 2018-09-20 ENCOUNTER — Other Ambulatory Visit: Payer: Self-pay | Admitting: Internal Medicine

## 2018-10-08 ENCOUNTER — Other Ambulatory Visit: Payer: Self-pay

## 2018-10-08 DIAGNOSIS — I10 Essential (primary) hypertension: Secondary | ICD-10-CM

## 2018-10-08 MED ORDER — CHLORTHALIDONE 25 MG PO TABS: 25.0000 mg | ORAL_TABLET | Freq: Every day | ORAL | 2 refills | Status: DC

## 2018-10-08 NOTE — Telephone Encounter (Signed)
Requesting a refill on bp med [address] [place], [place] [phone] (Phone) [phone] (Fax)

## 2018-10-27 ENCOUNTER — Telehealth: Payer: Self-pay | Admitting: *Deleted

## 2018-10-27 NOTE — Telephone Encounter (Signed)
Thank you. I agree 

## 2018-10-27 NOTE — Telephone Encounter (Signed)
Call from pt's girlfriend, Lora Havens - requesting to change pt's from 8/11 to an earlier date. Stated pt has productive cough, "coughing up phlegm". Denies chest pain, sob, fever. Stated he was tested on Friday, waiting for results. We have no available appts today.  Talked to Dr Dareen Piano, Attending for this morning- stated if pt only has a cough, needs to quarantine at home until he gets the results. Unless his symptoms become worse, gets a fever, c/o sob/cp, he needs to go to UC. Also he can try OTC med for his cough.  Called pt's girlfriend back - relayed above instructions to her. Stated pt has taken Mucinex and alka-seltzer cold plus. Also instructed to self quarantine (handwashing, distancing) for pt and herself since they live together until get the result back. Verbalized understanding.

## 2018-10-28 ENCOUNTER — Encounter: Payer: Medicare HMO | Admitting: Internal Medicine

## 2018-10-29 ENCOUNTER — Telehealth: Payer: Self-pay | Admitting: *Deleted

## 2018-10-29 NOTE — Telephone Encounter (Signed)
Pt calls and states he went to [facility] and was tested for COVID. He got a text message from some place hes never heard of telling him he is POSITIVE. He only has a cough per his caregiver trinese salters. He and she is leery as to this being legitimate. He was given no instructions as what to do now. He is asking to be tested at [facility] and find out exactly what to do if it is POSITIVE. You may call him at [phone] He was cautioned to stay in his home, stay away from others, mask , keep hands clean, keep surfaces clean, drink plenty of fluids, rest, call 911 for chest pain and being extremely short of breath Sending to attending and dr Truman Hayward

## 2018-10-29 NOTE — Telephone Encounter (Signed)
Called [facility] they have no record of pt and/ or testing Called trinese back, she states the results came as a text message from [facility] with no ph# attached I went over the isolation process and when to call 911 if needed and general care instructions for pt, trinese verb understanding by repeating back. She states she has a telehealth appt w/ her pcp tomorrow Finally traced [facility] down and pt will call them do electronic release of info and they will fax the results

## 2018-10-29 NOTE — Telephone Encounter (Signed)
Can you call [facility] and get his test results faxed over?  If they will not release it to you, please asked him to call their office.  I agree that he needs to treat this as a positive and isolate as you told him to.  I can order a [facility] COVID test but I do not know if his insurance companiy will charge him as I read something yesterday that patients only get 1 free COVID test from their insurance.  Even if the [facility] COVID test is negative, that would not change my recommendation that he isolate until he meets the CDC criteria to stop isolation.

## 2018-10-29 NOTE — Telephone Encounter (Signed)
Thanks

## 2018-10-29 NOTE — Telephone Encounter (Signed)
trinese calls back and states the lady at [facility] told her they didn't do electronis ROI and trinese would need to email the results, pt does not have mychart. I do not know how we would go about this, will ask kayeg.

## 2018-10-30 NOTE — Telephone Encounter (Signed)
Called again, straight to vmail, it was full

## 2018-10-30 NOTE — Telephone Encounter (Signed)
Pt's girlfriend states, patient have no energy, he's positive for COVID-19. Want to know what should they do. Please call pt back.

## 2018-10-30 NOTE — Telephone Encounter (Signed)
Rtc, lm for rtc 

## 2018-10-31 NOTE — Telephone Encounter (Signed)
Attempted to reach out to Mr.Caddell to discuss his symptoms. Home phone number was not in service. Attempted to call girlfriend's number, which went straight to voicemail which was full.

## 2018-11-02 ENCOUNTER — Other Ambulatory Visit: Payer: Self-pay

## 2018-11-02 ENCOUNTER — Emergency Department (HOSPITAL_COMMUNITY)
Admission: EM | Admit: 2018-11-02 | Discharge: 2018-11-02 | Disposition: A | Discharge status: Home / Self Care | Payer: Medicare HMO | Attending: Emergency Medicine | Admitting: Emergency Medicine

## 2018-11-02 ENCOUNTER — Encounter (HOSPITAL_COMMUNITY): Payer: Self-pay | Admitting: Emergency Medicine

## 2018-11-02 DIAGNOSIS — Z79899 Other long term (current) drug therapy: Secondary | ICD-10-CM | POA: Insufficient documentation

## 2018-11-02 DIAGNOSIS — R05 Cough: Secondary | ICD-10-CM | POA: Diagnosis present

## 2018-11-02 DIAGNOSIS — U071 COVID-19: Secondary | ICD-10-CM | POA: Insufficient documentation

## 2018-11-02 DIAGNOSIS — Z7982 Long term (current) use of aspirin: Secondary | ICD-10-CM | POA: Diagnosis not present

## 2018-11-02 DIAGNOSIS — R059 Cough, unspecified: Secondary | ICD-10-CM

## 2018-11-02 DIAGNOSIS — I10 Essential (primary) hypertension: Secondary | ICD-10-CM | POA: Insufficient documentation

## 2018-11-02 DIAGNOSIS — J45909 Unspecified asthma, uncomplicated: Secondary | ICD-10-CM | POA: Insufficient documentation

## 2018-11-02 MED ORDER — BENZONATATE 100 MG PO CAPS: 100.0000 mg | ORAL_CAPSULE | Freq: Three times a day (TID) | ORAL | 0 refills | Status: DC | PRN

## 2018-11-02 MED ORDER — CHLORTHALIDONE 25 MG PO TABS: 25.0000 mg | ORAL_TABLET | Freq: Every day | ORAL | 0 refills | Status: DC

## 2018-11-02 NOTE — ED Triage Notes (Signed)
Pt reports that he tested positive for COVID this week. Denies new symptoms, c/o continuous cough. Denies fever/shortness of breath/chest pain.

## 2018-11-02 NOTE — ED Provider Notes (Signed)
[facility] EMERGENCY DEPARTMENT Provider Note   CSN: [id number] Arrival date & time: 11/02/18  0309     History   Chief Complaint Chief Complaint  Patient presents with  . Cough    HPI [name] [name] is a 63 y.o. male.     63 year old male with a history of dyslipidemia, hypertension, esophageal reflux, asthma presents to the emergency department for evaluation.  States that he has had a continuous cough, but on further questioning states that this has been present for 3 years.  He has not taken any medications for his cough.  Has also been out of his blood pressure medication over the past few days.  Is scheduled to follow-up with his primary care doctor this coming week.  Of note, patient recently tested positive for COVID.  He was tested at a church and went for screening; did not have any symptoms at the time of testing.  Denies fever, congestion, sore throat, shortness of breath, chest pain, dyspnea on exertion, vomiting or diarrhea.  The history is provided by the patient. No language interpreter was used.  Cough   Past Medical History:  Diagnosis Date  . Allergy   . Asthma   . Colon polyps    noted 10/2011 colonoscopy  . Diverticulosis    mild noted 10/2011  . GERD (gastroesophageal reflux disease)   . Hiatal hernia    small noted 10/2011  . Hiatal hernia    small  . HLD (hyperlipidemia)   . Hypertension   . Mild intermittent asthma 12/24/2017  . Parasomnia    movement with sleep  . Pyrosis   . Sleep apnea    no cpap  . Varicose veins of both lower extremities with pain    right>left    Patient Active Problem List   Diagnosis Date Noted  . Erectile dysfunction due to diseases classified elsewhere 03/28/2018  . Mild intermittent asthma 12/24/2017  . Gait disturbance 07/19/2016  . Cough 02/14/2016  . Seasonal allergies 01/07/2014  . Obesity 11/19/2011  . Health care maintenance 11/11/2011  . GERD (gastroesophageal reflux disease)  08/15/2011  . OSA (obstructive sleep apnea) 06/21/2011    Class: Chronic  . Dyslipidemia 07/05/2009  . Essential hypertension 07/05/2009  . VARICOSE VEINS, LOWER EXTREMITIES 07/05/2009    Past Surgical History:  Procedure Laterality Date  . ABDOMINAL SURGERY  3 years ago   pt unknown what it was,states "stomach with tape on it"  . COLONOSCOPY  2013  . POLYPECTOMY          Home Medications    Prior to Admission medications   Medication Sig Start Date End Date Taking? Authorizing Provider  albuterol (VENTOLIN HFA) 108 (90 Base) MCG/ACT inhaler Inhale 1-2 puffs into the lungs every 6 (six) hours as needed for wheezing or shortness of breath. 12/25/17   Mosetta Anis, MD  aspirin EC 81 MG tablet Take 81 mg by mouth every 6 (six) hours as needed (takes sometimes).     [provider]  atorvastatin (LIPITOR) 40 MG tablet Take 1 tablet (40 mg total) by mouth daily. 03/28/18   Mosetta Anis, MD  benzonatate (TESSALON) 100 MG capsule Take 1 capsule (100 mg total) by mouth 3 (three) times daily as needed for cough. 11/02/18   Antonietta Breach, PA-C  cetirizine (ZYRTEC) 10 MG tablet Take 1 tablet (10 mg total) by mouth daily. 12/17/17   Mosetta Anis, MD  chlorthalidone (HYGROTON) 25 MG tablet Take 1 tablet (  25 mg total) by mouth daily. 11/02/18   Antonietta Breach, PA-C  fluticasone (FLONASE) 50 MCG/ACT nasal spray Place 1 spray into both nostrils daily. 03/28/18 03/28/19  Mosetta Anis, MD  Guaifenesin 1200 MG TB12 Take 1 tablet (1,200 mg total) by mouth 2 (two) times daily. 12/17/17   Mosetta Anis, MD  ibuprofen (ADVIL,MOTRIN) 400 MG tablet Take 1 tablet (400 mg total) by mouth every 6 (six) hours as needed. 07/25/17   Couture, Cortni S, PA-C  pantoprazole (PROTONIX) 40 MG tablet Take 1 tablet (40 mg total) by mouth daily. 03/28/18   Mosetta Anis, MD  polyethylene glycol [facility] / Floria Raveling) packet Take 17 g by mouth daily as needed for moderate constipation. Reported on 07/12/2015 07/12/15   Rivet, Sindy Guadeloupe,  MD  pseudoephedrine (SUDAFED) 30 MG tablet Take 1 tablet (30 mg total) by mouth every 6 (six) hours as needed for congestion. 03/28/18 03/28/19  Mosetta Anis, MD  sildenafil (VIAGRA) 50 MG tablet Take 1 tablet (50 mg total) by mouth as needed for erectile dysfunction. 03/28/18 03/28/19  Mosetta Anis, MD    Family History Family History  Problem Relation Age of Onset  . Cancer Mother        died at age 35 uterine cancer   . Diabetes Father   . Crohn's disease Son   . Hypertension Other        uncle  . Colon cancer Neg Hx   . Colon polyps Neg Hx   . Esophageal cancer Neg Hx   . Rectal cancer Neg Hx   . Stomach cancer Neg Hx     Social History Social History   Tobacco Use  . Smoking status: Never Smoker  . Smokeless tobacco: Never Used  Substance Use Topics  . Alcohol use: Yes    Alcohol/week: 2.0 standard drinks    Types: 2 Cans of beer per week    Comment: Beer sometimes occasionally.  . Drug use: No     Allergies   Patient has no known allergies.   Review of Systems Review of Systems  Respiratory: Positive for cough.   Ten systems reviewed and are negative for acute change, except as noted in the HPI.    Physical Exam Updated Vital Signs BP (!) 143/102 (BP Location: Right Arm)   Pulse 94   Temp 98.7 F (37.1 C) (Oral)   Resp 17   SpO2 98%   Physical Exam Vitals signs and nursing note reviewed.  Constitutional:      General: He is not in acute distress.    Appearance: He is well-developed. He is not diaphoretic.     Comments: Nontoxic appearing and in NAD  HENT:     Head: Normocephalic and atraumatic.  Eyes:     General: No scleral icterus.    Conjunctiva/sclera: Conjunctivae normal.  Neck:     Musculoskeletal: Normal range of motion.  Cardiovascular:     Rate and Rhythm: Normal rate and regular rhythm.     Pulses: Normal pulses.  Pulmonary:     Effort: Pulmonary effort is normal. No respiratory distress.     Breath sounds: No stridor. No wheezing.      Comments: Respirations even and unlabored. SpO2 98% on room air. Musculoskeletal: Normal range of motion.  Skin:    General: Skin is warm and dry.     Coloration: Skin is not pale.     Findings: No erythema or rash.  Neurological:     General:  No focal deficit present.     Mental Status: He is alert and oriented to person, place, and time.     Coordination: Coordination normal.  Psychiatric:        Behavior: Behavior normal.      ED Treatments / Results  Labs (all labs ordered are listed, but only abnormal results are displayed) Labs Reviewed - No data to display  EKG None  Radiology No results found.  Procedures Procedures (including critical care time)  Medications Ordered in ED Medications - No data to display   Initial Impression / Assessment and Plan / ED Course  I have reviewed the triage vital signs and the nursing notes.  Pertinent labs & imaging results that were available during my care of the patient were reviewed by me and considered in my medical decision making (see chart for details).        63 year old male presents to the emergency department for complaints of cough, though states this cough has been chronic x3 years.  Denies any significant worsening of this recently.  He did test positive for COVID, but was asymptomatic at the time of testing and went only for screening purposes.  He is currently well-appearing and in no distress.  No hypoxia and does not desat with ambulation.  He denies any shortness of breath, congestion, chest pain, fevers.  Do not see indication for further emergent work-up at this time.  Cough is also chronic, though will discharge with short course of Tessalon.  Encouraged follow-up with his primary care doctor as scheduled this coming week.  Return precautions discussed and provided. Patient discharged in stable condition with no unaddressed concerns.  [name] [name] was evaluated in Emergency Department on 11/02/2018 for the  symptoms described in the history of present illness. He was evaluated in the context of the global COVID-19 pandemic, which necessitated consideration that the patient might be at risk for infection with the SARS-CoV-2 virus that causes COVID-19. Institutional protocols and algorithms that pertain to the evaluation of patients at risk for COVID-19 are in a state of rapid change based on information released by regulatory bodies including the CDC and federal and state organizations. These policies and algorithms were followed during the patient's care in the ED.   Final Clinical Impressions(s) / ED Diagnoses   Final diagnoses:  Cough  COVID-19 virus detected  Essential hypertension    ED Discharge Orders         Ordered    chlorthalidone (HYGROTON) 25 MG tablet  Daily     11/02/18 0424    benzonatate (TESSALON) 100 MG capsule  3 times daily PRN     11/02/18 0424           Antonietta Breach, PA-C 11/02/18 [place], McDonald, MD 11/02/18 [phone]

## 2018-11-02 NOTE — Discharge Instructions (Signed)
Follow-up with your primary care doctor as scheduled.  You may take [name] [name] as prescribed for cough.  Resume your blood pressure medication.  You may return to the ED for any new or concerning symptoms.

## 2018-11-02 NOTE — ED Notes (Signed)
Pt ambulated from triage to treatment room, denied sob, saturations 94-95% on room air. He says he has only had a cough.

## 2018-11-04 ENCOUNTER — Other Ambulatory Visit: Payer: Self-pay

## 2018-11-04 ENCOUNTER — Telehealth: Payer: Self-pay | Admitting: Internal Medicine

## 2018-11-04 ENCOUNTER — Encounter: Payer: Medicare HMO | Admitting: Internal Medicine

## 2018-11-04 NOTE — Telephone Encounter (Signed)
Called [name] [name]'s home number as well as his significant other's phone number for his scheduled telehealth visit. No answer. Voice mail not set up.

## 2018-11-05 ENCOUNTER — Telehealth: Payer: Self-pay | Admitting: Internal Medicine

## 2018-11-05 NOTE — Telephone Encounter (Signed)
Please call pt at [phone]. This is sig other ph#

## 2018-11-05 NOTE — Telephone Encounter (Signed)
Pt calling to report he did not receive his [facility]  Visit call for yesterday's appt 11/04/2018.  Please call [phone] to reach the patient.

## 2018-11-07 ENCOUNTER — Encounter: Payer: Self-pay | Admitting: Internal Medicine

## 2018-11-07 ENCOUNTER — Ambulatory Visit (INDEPENDENT_AMBULATORY_CARE_PROVIDER_SITE_OTHER): Payer: Medicare HMO | Admitting: Internal Medicine

## 2018-11-07 ENCOUNTER — Other Ambulatory Visit: Payer: Self-pay

## 2018-11-07 DIAGNOSIS — U071 COVID-19: Secondary | ICD-10-CM | POA: Diagnosis not present

## 2018-11-07 DIAGNOSIS — E785 Hyperlipidemia, unspecified: Secondary | ICD-10-CM

## 2018-11-07 DIAGNOSIS — I10 Essential (primary) hypertension: Secondary | ICD-10-CM

## 2018-11-07 DIAGNOSIS — R059 Cough, unspecified: Secondary | ICD-10-CM

## 2018-11-07 DIAGNOSIS — Z79899 Other long term (current) drug therapy: Secondary | ICD-10-CM

## 2018-11-07 DIAGNOSIS — N521 Erectile dysfunction due to diseases classified elsewhere: Secondary | ICD-10-CM

## 2018-11-07 DIAGNOSIS — Z7189 Other specified counseling: Secondary | ICD-10-CM

## 2018-11-07 DIAGNOSIS — K219 Gastro-esophageal reflux disease without esophagitis: Secondary | ICD-10-CM

## 2018-11-07 DIAGNOSIS — R05 Cough: Secondary | ICD-10-CM

## 2018-11-07 MED ORDER — HYDROCHLOROTHIAZIDE 25 MG PO TABS: 25.0000 mg | ORAL_TABLET | Freq: Every day | ORAL | 3 refills | Status: DC

## 2018-11-07 MED ORDER — SILDENAFIL CITRATE 50 MG PO TABS: 50.0000 mg | ORAL_TABLET | ORAL | 1 refills | Status: DC | PRN

## 2018-11-07 MED ORDER — PANTOPRAZOLE SODIUM 40 MG PO TBEC: 40.0000 mg | DELAYED_RELEASE_TABLET | Freq: Every day | ORAL | 2 refills | Status: DC

## 2018-11-07 MED ORDER — ATORVASTATIN CALCIUM 40 MG PO TABS: 40.0000 mg | ORAL_TABLET | Freq: Every day | ORAL | 3 refills | Status: DC

## 2018-11-07 NOTE — Assessment & Plan Note (Signed)
Presents after testing positive for COVID-19 at outside location on 10/27/18. He states he has been having significant cough with productive white sputum. He has been taking his tessalo pearls with minimal improvement but overall feels better than week ago. Discussed with Mr.Ijames regarding appropriate self-quarantine instructions and importance of wearing face masks. Explained red-flag symptoms such as fevers, chills, dyspnea, or chest pain.  - Self-quarantine protocol discussed

## 2018-11-07 NOTE — Assessment & Plan Note (Signed)
Pt requires refills on medications with associated diagnosis above.  Reviewed disease process and find this medication to be necessary, will not change dose or alter current therapy. 

## 2018-11-07 NOTE — Progress Notes (Signed)
  [facility] Telephone Encounter Continuity Care Appointment  HPI:   This telephone encounter was created for [name] [name] on 11/07/2018 for the following purpose/cc COVID-19.  [name] [name] was evaluated via phone for his continuity appointment. He was able to confirm his date of birth and provided consent for his appointment. He states he was confirmed to be COVID-19 on 10/27/2018 and has been experiencing productive cough with white sputum over the last 2 weeks. He mentions that he has been self-quarantining at home as instructed. He had an ED visit on 11/02/18 for his symptoms and was provided tessalon pearls for his cough which he had been taking as prescribed without significant effect. However he states he feels 'fine and better' overall. He denies any fevers, chills, hemoptysis, nausea, vomiting, diarrhea or constipation. He denies any sensory changes, chest pain, palpitations or dyspnea. He mentions that he needs refills on his medications and complains regarding his co-pays despite being on dual coverage.   Past Medical History:  Past Medical History:  Diagnosis Date  . Allergy   . Asthma   . Colon polyps    noted 10/2011 colonoscopy  . Diverticulosis    mild noted 10/2011  . GERD (gastroesophageal reflux disease)   . Hiatal hernia    small noted 10/2011  . Hiatal hernia    small  . HLD (hyperlipidemia)   . Hypertension   . Mild intermittent asthma 12/24/2017  . Parasomnia    movement with sleep  . Pyrosis   . Sleep apnea    no cpap  . Varicose veins of both lower extremities with pain    right>left      ROS:  Review of Systems  Constitutional: Negative for chills, fever and malaise/fatigue.  Respiratory: Positive for cough and sputum production. Negative for shortness of breath and wheezing.   All other systems reviewed and are negative.    Assessment / Plan / Recommendations:   Please see A&P under problem oriented charting for  assessment of the patient's acute and chronic medical conditions.   As always, pt is advised that if symptoms worsen or new symptoms arise, they should go to an urgent care facility or to to ER for further evaluation.   Consent and Medical Decision Making:   Patient discussed with Dr. Evette Doffing  This is a telephone encounter between [name] [name] and Mosetta Anis on 11/07/2018 for COVID-19. The visit was conducted with the patient located at home and Mosetta Anis at [facility]. The patient's identity was confirmed using their DOB and current address. The patient has consented to being evaluated through a telephone encounter and understands the associated risks (an examination cannot be done and the patient may need to come in for an appointment) / benefits (allows the patient to remain at home, decreasing exposure to coronavirus). I personally spent 13 minutes on medical discussion.

## 2018-11-07 NOTE — Assessment & Plan Note (Signed)
BP Readings from Last 3 Encounters:  11/02/18 (!) 143/102  04/10/18 (!) 142/90  03/28/18 (!) 142/86    He states he has not been taking his medications as prescribed due to co-pay. Discussed changing to HCTZ for lower cost. Denies any light-headedness, dizziness, chest pain, lower extremity swelling.  - Start HCTZ 25mg  daily

## 2018-11-07 NOTE — Progress Notes (Signed)
Opened in error

## 2018-11-07 NOTE — Progress Notes (Signed)
Internal Medicine Clinic Attending ° °Case discussed with Dr. Lee at the time of the visit.  We reviewed the resident’s history and exam and pertinent patient test results.  I agree with the assessment, diagnosis, and plan of care documented in the resident’s note.  °

## 2018-11-07 NOTE — Telephone Encounter (Signed)
Spoke with [name] [name] regarding his COVID status and current symptoms. Refer to Telehealth visit from 11/04/18 for full details. Personal phone number also updated on his chart.

## 2018-11-12 NOTE — Progress Notes (Signed)
This encounter was created in error - please disregard.

## 2019-07-09 ENCOUNTER — Other Ambulatory Visit: Payer: Self-pay | Admitting: Family Medicine

## 2019-07-09 DIAGNOSIS — R252 Cramp and spasm: Secondary | ICD-10-CM

## 2019-07-10 ENCOUNTER — Other Ambulatory Visit: Payer: Self-pay

## 2019-07-10 ENCOUNTER — Ambulatory Visit
Admission: RE | Admit: 2019-07-10 | Discharge: 2019-07-10 | Disposition: A | Discharge status: Home / Self Care | Payer: Medicare HMO | Source: Ambulatory Visit | Attending: Family Medicine | Admitting: Family Medicine

## 2019-07-10 ENCOUNTER — Other Ambulatory Visit: Payer: Self-pay | Admitting: Family Medicine

## 2019-07-10 DIAGNOSIS — R252 Cramp and spasm: Secondary | ICD-10-CM

## 2019-07-10 DIAGNOSIS — J45909 Unspecified asthma, uncomplicated: Secondary | ICD-10-CM

## 2019-08-19 ENCOUNTER — Encounter (HOSPITAL_COMMUNITY): Payer: Self-pay | Admitting: *Deleted

## 2019-08-19 ENCOUNTER — Emergency Department (HOSPITAL_COMMUNITY)
Admission: EM | Admit: 2019-08-19 | Discharge: 2019-08-20 | Disposition: A | Discharge status: Home / Self Care | Payer: Medicare HMO | Attending: Emergency Medicine | Admitting: Emergency Medicine

## 2019-08-19 ENCOUNTER — Other Ambulatory Visit: Payer: Self-pay

## 2019-08-19 ENCOUNTER — Emergency Department (HOSPITAL_COMMUNITY): Payer: Medicare HMO

## 2019-08-19 DIAGNOSIS — Z5321 Procedure and treatment not carried out due to patient leaving prior to being seen by health care provider: Secondary | ICD-10-CM | POA: Insufficient documentation

## 2019-08-19 DIAGNOSIS — R079 Chest pain, unspecified: Secondary | ICD-10-CM | POA: Diagnosis not present

## 2019-08-19 LAB — CBC
HCT: 37.7 % — ABNORMAL LOW (ref 39.0–52.0)
Hemoglobin: 12 g/dL — ABNORMAL LOW (ref 13.0–17.0)
MCH: 29.6 pg (ref 26.0–34.0)
MCHC: 31.8 g/dL (ref 30.0–36.0)
MCV: 93.1 fL (ref 80.0–100.0)
Platelets: 192 10*3/uL (ref 150–400)
RBC: 4.05 MIL/uL — ABNORMAL LOW (ref 4.22–5.81)
RDW: 13.1 % (ref 11.5–15.5)
WBC: 6 10*3/uL (ref 4.0–10.5)
nRBC: 0 % (ref 0.0–0.2)

## 2019-08-19 MED ORDER — SODIUM CHLORIDE 0.9% FLUSH: 3.0000 mL | Freq: Once | INTRAVENOUS | Status: DC

## 2019-08-19 NOTE — ED Triage Notes (Signed)
Left sided chest pain after eating chinese chicken with hot sauce today.

## 2019-08-20 DIAGNOSIS — R079 Chest pain, unspecified: Secondary | ICD-10-CM | POA: Diagnosis not present

## 2019-08-20 LAB — BASIC METABOLIC PANEL
Anion gap: 14 (ref 5–15)
BUN: 21 mg/dL (ref 8–23)
CO2: 22 mmol/L (ref 22–32)
Calcium: 9.4 mg/dL (ref 8.9–10.3)
Chloride: 104 mmol/L (ref 98–111)
Creatinine, Ser: 1.2 mg/dL (ref 0.61–1.24)
GFR calc Af Amer: >60 mL/min (ref >60)
GFR calc non Af Amer: >60 mL/min (ref >60)
Glucose, Bld: 101 mg/dL — ABNORMAL HIGH (ref 70–99)
Potassium: 3.7 mmol/L (ref 3.5–5.1)
Sodium: 140 mmol/L (ref 135–145)

## 2019-08-20 LAB — TROPONIN I (HIGH SENSITIVITY)
Troponin I (High Sensitivity): 9 ng/L (ref <18)
Troponin I (High Sensitivity): 9 ng/L (ref <18)

## 2019-08-20 NOTE — ED Notes (Signed)
Pt states he is leaving °

## 2020-06-10 ENCOUNTER — Other Ambulatory Visit: Payer: Self-pay | Admitting: General Practice

## 2020-06-10 ENCOUNTER — Ambulatory Visit
Admission: RE | Admit: 2020-06-10 | Discharge: 2020-06-10 | Disposition: A | Discharge status: Home / Self Care | Payer: Medicare HMO | Source: Ambulatory Visit | Attending: General Practice | Admitting: General Practice

## 2020-06-10 DIAGNOSIS — R0602 Shortness of breath: Secondary | ICD-10-CM

## 2020-06-10 DIAGNOSIS — R062 Wheezing: Secondary | ICD-10-CM

## 2020-06-10 DIAGNOSIS — R053 Chronic cough: Secondary | ICD-10-CM

## 2020-07-13 ENCOUNTER — Encounter: Payer: Self-pay | Admitting: Gastroenterology

## 2020-08-13 ENCOUNTER — Encounter: Payer: Self-pay | Admitting: *Deleted

## 2020-08-30 ENCOUNTER — Institutional Professional Consult (permissible substitution): Payer: Medicare HMO | Admitting: Pulmonary Disease

## 2020-09-12 ENCOUNTER — Encounter: Payer: Self-pay | Admitting: Pulmonary Disease

## 2020-10-26 ENCOUNTER — Institutional Professional Consult (permissible substitution): Payer: Medicare HMO | Admitting: Pulmonary Disease

## 2020-11-04 ENCOUNTER — Encounter: Payer: Self-pay | Admitting: Gastroenterology

## 2020-12-01 ENCOUNTER — Encounter: Payer: Self-pay | Admitting: Gastroenterology

## 2020-12-07 ENCOUNTER — Encounter: Payer: Medicare HMO | Admitting: Gastroenterology

## 2021-01-09 ENCOUNTER — Ambulatory Visit (AMBULATORY_SURGERY_CENTER): Payer: Medicare HMO

## 2021-01-09 ENCOUNTER — Other Ambulatory Visit: Payer: Self-pay

## 2021-01-09 VITALS — Ht 66.0 in | Wt 260.0 lb

## 2021-01-09 DIAGNOSIS — Z86010 Personal history of colonic polyps: Secondary | ICD-10-CM

## 2021-01-09 MED ORDER — NA SULFATE-K SULFATE-MG SULF 17.5-3.13-1.6 GM/177ML PO SOLN: 1.0000 | Freq: Once | ORAL | 0 refills | Status: AC

## 2021-01-09 NOTE — Progress Notes (Signed)
Pre visit completed via phone call; Patient verified name, DOB, and address; No egg or soy allergy known to patient  No issues known to pt with past sedation with any surgeries or procedures Patient denies ever being told they had issues or difficulty with intubation  No FH of Malignant Hyperthermia Pt is not on diet pills Pt is not on home 02  Pt is not on blood thinners  Pt reports issues with constipation - patient reports using Miralax to assist with constipation- advised to increase fluids, increase activity No A fib or A flutter Pt is fully vaccinated for Covid x 2; NO PA's for preps discussed with pt in PV today  Discussed with pt there will be an out-of-pocket cost for prep and that varies from $0 to 70 +  dollars - pt verbalized understanding  Due to the COVID-19 pandemic we are asking patients to follow certain guidelines in PV and the [place]   Pt aware of COVID protocols and [facility] guidelines

## 2021-01-31 ENCOUNTER — Encounter: Payer: Medicare HMO | Admitting: Gastroenterology

## 2021-01-31 ENCOUNTER — Telehealth: Payer: Self-pay | Admitting: Gastroenterology

## 2021-01-31 NOTE — Telephone Encounter (Signed)
Good morning Dr. Ardis Hughs, patient's partner called stating patient had an emergency come up and will not be able to make it to his appointment today.

## 2021-03-06 NOTE — H&P (Signed)
HISTORY AND PHYSICAL  [name] [name] is a 66 y.o. male patient with JO:ITGPQDI teeth  No diagnosis found.  Past Medical History:  Diagnosis Date   Allergy    Anemia    not on medications   Asthma    Colon polyps    noted 10/2011 colonoscopy   Cough 02/14/2016   Diverticulosis    mild noted 10/2011   GERD (gastroesophageal reflux disease)    on meds   Hiatal hernia    small noted 10/2011   Hiatal hernia    small   HLD (hyperlipidemia)    not on meds   Hypertension    not on meds   Mild intermittent asthma 12/24/2017   Parasomnia    movement with sleep   Pyrosis    Seasonal allergies    Sleep apnea    NO CPAP   Varicose veins of both lower extremities with pain    right>left    No current facility-administered medications for this encounter.   Current Outpatient Medications  Medication Sig Dispense Refill   albuterol (VENTOLIN HFA) 108 (90 Base) MCG/ACT inhaler Inhale 1-2 puffs into the lungs every 6 (six) hours as needed for wheezing or shortness of breath. 1 Inhaler 11   amLODipine (NORVASC) 5 MG tablet Take 5 mg by mouth daily.     levocetirizine (XYZAL) 5 MG tablet Take 5 mg by mouth every evening.     montelukast (SINGULAIR) 10 MG tablet Take 10 mg by mouth at bedtime.     oxyCODONE-acetaminophen (PERCOCET) 7.5-325 MG tablet Take 1 tablet by mouth every 4 (four) hours as needed for severe pain.     No Known Allergies Active Problems:   * No active hospital problems. *  Vitals: There were no vitals taken for this visit. Lab results:No results found for this or any previous visit (from the past 57 hour(s)). Radiology Results: No results found. General appearance: alert, cooperative, no distress, and morbidly obese Head: Normocephalic, without obvious abnormality, atraumatic Eyes: negative Nose: Nares normal. Septum midline. Mucosa normal. No drainage or sinus tenderness. Throat: Buccal fistula tooth #6, Gross decay teeth # 14, 15, 17.  Neck: no  adenopathy Resp: clear to auscultation bilaterally Cardio: regular rate and rhythm, S1, S2 normal, no murmur, click, rub or gallop  Assessment:Non-restorable teeth # 6, 14, 15, and 17. Dental phobia.   Plan: Extraction teeth # 6, 14, 15, and 17. GA. Day surgery.   Diona Browner 03/06/2021

## 2021-03-09 ENCOUNTER — Encounter (HOSPITAL_COMMUNITY): Payer: Self-pay | Admitting: Oral Surgery

## 2021-03-09 ENCOUNTER — Encounter (HOSPITAL_COMMUNITY): Payer: Self-pay | Admitting: General Practice

## 2021-03-09 ENCOUNTER — Other Ambulatory Visit: Payer: Self-pay

## 2021-03-09 NOTE — Progress Notes (Signed)
Spoke with pt and his girlfriend Teacher, music. Pt has hx of HTN but denies cardiac history or Diabetes.   Instructed pt that he had to have someone to drive him home, T[place] does not drive. Pt states he will arrange a driver.  Pt's surgery is scheduled as ambulatory so no Covid test is required prior to surgery.

## 2021-03-10 ENCOUNTER — Ambulatory Visit (HOSPITAL_COMMUNITY)
Admission: RE | Admit: 2021-03-10 | Discharge: 2021-03-10 | Disposition: A | Discharge status: Home / Self Care | Payer: Medicare HMO | Attending: Oral Surgery | Admitting: Oral Surgery

## 2021-03-10 ENCOUNTER — Encounter (HOSPITAL_COMMUNITY)
Admission: RE | Disposition: A | Discharge status: Home / Self Care | Payer: Self-pay | Source: Home / Self Care | Attending: Oral Surgery

## 2021-03-10 DIAGNOSIS — Z538 Procedure and treatment not carried out for other reasons: Secondary | ICD-10-CM | POA: Diagnosis not present

## 2021-03-10 DIAGNOSIS — K0889 Other specified disorders of teeth and supporting structures: Secondary | ICD-10-CM | POA: Diagnosis present

## 2021-03-10 HISTORY — DX: Unspecified osteoarthritis, unspecified site: M19.90

## 2021-03-10 HISTORY — DX: COVID-19: U07.1

## 2021-03-10 SURGERY — DENTAL RESTORATION/EXTRACTIONS
Anesthesia: General

## 2021-03-10 NOTE — Progress Notes (Addendum)
Pt has no ride home so surgery will be rescheduled. Attempted to notify jensen with no answer.

## 2021-03-28 ENCOUNTER — Telehealth: Payer: Self-pay | Admitting: *Deleted

## 2021-03-28 NOTE — Telephone Encounter (Signed)
Called placed to  patient for pre-visit, # and message left for pt. To call back by 5 to reschedule pre-visit  or procedure for 04/10/21 will be canceled.

## 2021-03-28 NOTE — Telephone Encounter (Signed)
No return called received procedure canceled,no show letter sent.

## 2021-03-29 ENCOUNTER — Encounter: Payer: Self-pay | Admitting: Gastroenterology

## 2021-03-29 NOTE — Telephone Encounter (Signed)
Patient called back, stated that he had to get a new phone yesterday.   Patient was rescheduled: Telephone call 2/7 at 11:00 Procedure 2/21 at 2:00

## 2021-04-05 NOTE — H&P (Signed)
°  Patient referred by  DDS for extraction teeth 6, 14, 15, 17  CC: No pain.  Past Medical History:  High Blood Pressure, Hay Fever or Sinus Problems, snoring/sleep apnea, Morbid Obesity    Medications: Amlodipine, Etodolac, Oxycodone, Crestor, Pantoprazole    Allergies:     NKDA    Surgeries:   Colonoscopy     Social History       Smoking:  n          Alcohol:n Drug use:n                             Exam: BMI 42. Decay # 6, 14, 15, gross decay #17.   No purulence, edema, fluctuance, trismus. Oral cancer screening negative. Pharynx clear. No lymphadenopathy.  Panorex: Decay # 6, 14, 15, gross decay #17.    Assessment: ASA 3. Non-restorable  teeth # 6, 14, 15, 17.             Plan: 1. MD Clearance  2. Extraction Teeth #  6, 14, 15, 17.         Hospital Day surgery.                 Rx: n               Risks and complications explained. Questions answered.   Gae Bon, DMD

## 2021-04-08 ENCOUNTER — Encounter (HOSPITAL_COMMUNITY): Payer: Self-pay | Admitting: Oral Surgery

## 2021-04-08 ENCOUNTER — Other Ambulatory Visit: Payer: Self-pay

## 2021-04-08 NOTE — Progress Notes (Signed)
Pre-op instructions given to pt.  No solid food  after midnight,clear liquids til 0715. PCP: Edie in [place]

## 2021-04-09 ENCOUNTER — Encounter (HOSPITAL_COMMUNITY): Payer: Self-pay | Admitting: Certified Registered Nurse Anesthetist

## 2021-04-09 NOTE — Anesthesia Preprocedure Evaluation (Deleted)
Anesthesia Evaluation    Reviewed: Allergy & Precautions, Patient's Chart, lab work & pertinent test results  History of Anesthesia Complications Negative for: history of anesthetic complications  Airway        Dental   Pulmonary asthma , sleep apnea ,           Cardiovascular hypertension, Pt. on medications      Neuro/Psych negative neurological ROS     GI/Hepatic Neg liver ROS, hiatal hernia, GERD  Medicated,  Endo/Other  Morbid obesity  Renal/GU negative Renal ROS     Musculoskeletal negative musculoskeletal ROS (+)   Abdominal   Peds  Hematology negative hematology ROS (+)   Anesthesia Other Findings   Reproductive/Obstetrics                             Anesthesia Physical Anesthesia Plan  ASA: 3  Anesthesia Plan: General   Post-op Pain Management: Celebrex PO (pre-op) and Tylenol PO (pre-op)   Induction:   PONV Risk Score and Plan: 2 and Ondansetron, Dexamethasone and Midazolam  Airway Management Planned: Nasal ETT  Additional Equipment:   Intra-op Plan:   Post-operative Plan: Extubation in OR  Informed Consent:   Plan Discussed with: Anesthesiologist  Anesthesia Plan Comments:         Anesthesia Quick Evaluation

## 2021-04-10 ENCOUNTER — Encounter: Payer: Medicare HMO | Admitting: Gastroenterology

## 2021-04-10 ENCOUNTER — Ambulatory Visit (HOSPITAL_COMMUNITY)
Admission: RE | Admit: 2021-04-10 | Payer: Commercial Managed Care - HMO | Source: Home / Self Care | Admitting: Oral Surgery

## 2021-04-10 SURGERY — DENTAL RESTORATION/EXTRACTIONS
Anesthesia: General

## 2021-05-02 ENCOUNTER — Other Ambulatory Visit: Payer: Self-pay

## 2021-05-02 ENCOUNTER — Ambulatory Visit (AMBULATORY_SURGERY_CENTER): Payer: Medicaid Other | Admitting: *Deleted

## 2021-05-02 VITALS — Ht 66.0 in | Wt 260.0 lb

## 2021-05-02 DIAGNOSIS — Z86010 Personal history of colonic polyps: Secondary | ICD-10-CM

## 2021-05-02 MED ORDER — PEG 3350-KCL-NA BICARB-NACL 420 G PO SOLR: 4000.0000 mL | Freq: Once | ORAL | 0 refills | Status: AC

## 2021-05-02 NOTE — Progress Notes (Signed)
No egg or soy allergy known to patient  No issues known to pt with past sedation with any surgeries or procedures Patient denies ever being told they had issues or difficulty with intubation  No FH of Malignant Hyperthermia Pt is not on diet pills Pt is not on  home 02  Pt is not on blood thinners  Pt denies issues with constipation  No A fib or A flutter  Pt is  vaccinated  for Covid   Due to the COVID-19 pandemic we are asking patients to follow certain guidelines in PV and the [place]   Pt aware of COVID protocols and [facility] guidelines   PV completed over the phone. Pt verified name, DOB, address and insurance during PV today.  Pt mailed instruction packet with copy of consent form to read and not return, and instructions.  Pt encouraged to call with questions or issues.

## 2021-05-16 ENCOUNTER — Encounter: Payer: Medicare HMO | Admitting: Gastroenterology

## 2021-07-31 ENCOUNTER — Encounter: Payer: Self-pay | Admitting: Podiatrist

## 2021-07-31 ENCOUNTER — Other Ambulatory Visit: Payer: Self-pay | Admitting: *Deleted

## 2021-07-31 ENCOUNTER — Ambulatory Visit (INDEPENDENT_AMBULATORY_CARE_PROVIDER_SITE_OTHER): Payer: Medicare Other | Admitting: Podiatrist

## 2021-07-31 DIAGNOSIS — I8390 Asymptomatic varicose veins of unspecified lower extremity: Secondary | ICD-10-CM

## 2021-07-31 DIAGNOSIS — M2141 Flat foot [pes planus] (acquired), right foot: Secondary | ICD-10-CM | POA: Diagnosis not present

## 2021-07-31 DIAGNOSIS — M2142 Flat foot [pes planus] (acquired), left foot: Secondary | ICD-10-CM

## 2021-07-31 NOTE — Progress Notes (Signed)
? ? ?Chief Complaint  ?Patient presents with  ? Foot Pain  ?  I was here a few years ago and saw Dr March Rummage and my feet roll in and I had an ultra sound and it came back negative and my shoes wear out  ?  ? ?HPI: Patient is 66 y.o. male who presents today for generalized pain in both feet.  He was seen by Dr. March Rummage in the past and tried over the counter power step inserts. He relates they didn't really help and his primary care physician recommended custom orthotics.  He also has some swelling on the right leg of which he is concerned.  ? ?Patient Active Problem List  ? Diagnosis Date Noted  ? Advice given about COVID-19 virus by telephone 11/07/2018  ? Erectile dysfunction due to diseases classified elsewhere 03/28/2018  ? Mild intermittent asthma 12/24/2017  ? Gait disturbance 07/19/2016  ? Seasonal allergies 01/07/2014  ? Obesity 11/19/2011  ? Health care maintenance 11/11/2011  ? GERD (gastroesophageal reflux disease) 08/15/2011  ? OSA (obstructive sleep apnea) 06/21/2011  ?  Class: Chronic  ? Dyslipidemia 07/05/2009  ? Essential hypertension 07/05/2009  ? VARICOSE VEINS, LOWER EXTREMITIES 07/05/2009  ? ? ?Current Outpatient Medications on File Prior to Visit  ?Medication Sig Dispense Refill  ? albuterol (VENTOLIN HFA) 108 (90 Base) MCG/ACT inhaler Inhale 1-2 puffs into the lungs every 6 (six) hours as needed for wheezing or shortness of breath. 1 Inhaler 11  ? amLODipine (NORVASC) 5 MG tablet Take 5 mg by mouth daily.    ? oxyCODONE-acetaminophen (PERCOCET) 7.5-325 MG tablet Take 1 tablet by mouth every 4 (four) hours as needed for severe pain.    ? pantoprazole (PROTONIX) 40 MG tablet Take 40 mg by mouth daily.    ? ?No current facility-administered medications on file prior to visit.  ? ? ?No Known Allergies ? ?Review of Systems ?No fevers, chills, nausea, muscle aches, no difficulty breathing, no calf pain, no chest pain or shortness of breath. ? ? ?Physical Exam ? ?GENERAL APPEARANCE: Alert, conversant.  Appropriately groomed. No acute distress.  ? ?VASCULAR: Pedal pulses palpable DP and PT bilateral.  Capillary refill time is immediate to all digits,  Proximal to distal cooling it warm to warm.  Digital perfusion adequate.  Multiple variscosities noted on the right ankle and lower leg compared to the left. Swelling and discoloration is also noted on the right leg consistent with varicose veins and edema.   ? ?NEUROLOGIC: sensation is intact to 5.07 monofilament at 5/5 sites bilateral.  Light touch is intact bilateral, vibratory sensation intact bilateral ? ?MUSCULOSKELETAL: acceptable muscle strength, tone and stability bilateral.  C shaped foot with pes planus deformity is noted bilateral.  Upon standing, he pronates and rolls to the inside of the ankle. His shoes also show a wear pattern consistent with overpronation.   ? ?DERMATOLOGIC: skin is warm, supple, and dry.  Color, texture, and turgor of skin within normal limits.  No open wounds are noted.  No preulcerative lesions are seen.  Digital nails are asymptomatic.   ? ? ? ?Assessment  ? ?  ICD-10-CM   ?1. VARICOSE VEINS, LOWER EXTREMITIES  I83.90 Ambulatory referral to Vascular Surgery  ?  ?2. Pes planus of both feet  M21.41   ? M21.42   ?  ? ? ? ?Plan ? ?Discussed exam findings and recommendations with the patient.  Discussed orthotics and discussed that his insurance may or may not pay for the inserts.  He would like to discuss with Aaron Edelman as he has tried the over the counter inserts and they weren't helpful.  I will have him make an appontment to see Aaron Edelman to discuss orthotics and give recommendations on good shoes to go with the inserts.   ? ?I ordered a consult with [facility] in [place] to follow up on varicose veins.   ? ?He will return to see Aaron Edelman and will call if he has any question or concerns in the meantime.  ?

## 2021-07-31 NOTE — Patient Instructions (Signed)

## 2021-08-02 ENCOUNTER — Ambulatory Visit: Payer: Medicare Other

## 2021-08-02 DIAGNOSIS — M2141 Flat foot [pes planus] (acquired), right foot: Secondary | ICD-10-CM

## 2021-08-02 NOTE — Progress Notes (Signed)
SITUATION ?Patient Name:  [name] [name] ?MRN:   [id number] ?Reason for Visit: Evaluation for foot orthotics ? ?Patient Report: ?Chief Complaint: Patient does not want to proceed due to financial obligation ?OBJECTIVE DATA ?Patient History / Diagnosis:   ?  ICD-10-CM   ?1. Pes planus of both feet  M21.41   ? M21.42   ?  ? ? ?Physician Recommended Device(s): Custom foot orthotics ? ?Laterality HCPCS Code  ?bilateral I3568  ? ?ACTIONS PERFORMED ?Patient was seen for evaluation for foot orthotics. Financial responsibility was discussed, and patient requested a check of benefits and an estimation of their out of pocket cost for the equipment.  ? ?PLAN ?patient is to be contacted once insurance pre-certification and out of pocket cost estimate is obtained so that they make an informed decision regarding plan of care. In the event patient elects not to pursue orthotic treatment, referring physician is to be contacted in order to update plan of care as needed. All questions were answered and concerns addressed. ? ? ? ? ? ?

## 2021-12-25 ENCOUNTER — Encounter (HOSPITAL_COMMUNITY): Payer: Self-pay

## 2021-12-25 ENCOUNTER — Emergency Department (HOSPITAL_COMMUNITY): Payer: Medicare Other

## 2021-12-25 ENCOUNTER — Emergency Department (HOSPITAL_COMMUNITY)
Admission: EM | Admit: 2021-12-25 | Discharge: 2021-12-25 | Disposition: A | Discharge status: Home / Self Care | Payer: Medicare Other | Attending: Emergency Medicine | Admitting: Emergency Medicine

## 2021-12-25 DIAGNOSIS — M1611 Unilateral primary osteoarthritis, right hip: Secondary | ICD-10-CM | POA: Diagnosis not present

## 2021-12-25 DIAGNOSIS — Y92410 Unspecified street and highway as the place of occurrence of the external cause: Secondary | ICD-10-CM | POA: Diagnosis not present

## 2021-12-25 DIAGNOSIS — M25551 Pain in right hip: Secondary | ICD-10-CM | POA: Diagnosis present

## 2021-12-25 MED ORDER — DICLOFENAC SODIUM 1 % EX GEL
2.0000 g | Freq: Four times a day (QID) | CUTANEOUS | 0 refills | Status: AC
Start: 2021-12-25 — End: ?

## 2021-12-25 MED ORDER — LIDOCAINE 5 % EX PTCH
1.0000 | MEDICATED_PATCH | CUTANEOUS | 0 refills | Status: AC
Start: 2021-12-25 — End: ?
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
## 2021-12-25 NOTE — Discharge Instructions (Signed)
Your x-ray today shows arthritis in both hips, right worse than left.  You can apply Voltaren gel and Lidoderm as discussed.  Recommend follow-up with your primary care provider.

## 2021-12-25 NOTE — ED Notes (Signed)
Verbalized understanding discharge instructions
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
## 2022-01-07 ENCOUNTER — Other Ambulatory Visit: Payer: Self-pay | Admitting: *Deleted

## 2022-01-07 DIAGNOSIS — I8390 Asymptomatic varicose veins of unspecified lower extremity: Secondary | ICD-10-CM

## 2022-01-11 ENCOUNTER — Ambulatory Visit (HOSPITAL_COMMUNITY)
Admission: RE | Admit: 2022-01-11 | Discharge: 2022-01-11 | Disposition: A | Discharge status: Home / Self Care | Payer: Medicare Other | Source: Ambulatory Visit | Attending: Vascular Surgery | Admitting: Vascular Surgery

## 2022-01-11 DIAGNOSIS — I8390 Asymptomatic varicose veins of unspecified lower extremity: Secondary | ICD-10-CM | POA: Insufficient documentation

## 2022-01-11 DIAGNOSIS — I8391 Asymptomatic varicose veins of right lower extremity: Secondary | ICD-10-CM | POA: Diagnosis not present

## 2022-01-11 NOTE — Progress Notes (Signed)
Office Note     CC: Bilateral lower extremity varicose veins Requesting Provider:  Arthur Holms, NP  HPI: [name] [name] is a 66 y.o. (January 21, 1956) male who presents at the request of Arthur Holms, NP for evaluation of right lower extremity varicose veins.  Ethen has a longstanding history of varicosities in his right lower extremity.  They have been present for over 25 years.  Over the years, they have increased in size.  He notes some pain that waxes and wanes in the right leg, but denies ulceration, bleeding.  He denies heaviness or tired feeling by days end.  Does appreciate right lower extremity swelling.  He has not worn compression stockings.  No history of DVT.  No history of previous venous procedures.  Past Medical History:  Diagnosis Date   Allergy    Anemia    not on medications   Arthritis    Asthma    Colon polyps    noted 10/2011 colonoscopy   Cough 02/14/2016   COVID    mild case   Diverticulosis    mild noted 10/2011   GERD (gastroesophageal reflux disease)    on meds   Hiatal hernia    small noted 10/2011   Hiatal hernia    small   HLD (hyperlipidemia)    not on meds   Hypertension    not on meds   Mild intermittent asthma 12/24/2017   Parasomnia    movement with sleep   Pyrosis    Seasonal allergies    Sleep apnea    NO CPAP   Varicose veins of both lower extremities with pain    right>left    Past Surgical History:  Procedure Laterality Date   COLONOSCOPY  03/27/2011   polyps   COLONOSCOPY  2017   DJ-MAC-suprep(adequate)-high risk adenoma   POLYPECTOMY      Social History   Socioeconomic History   Marital status: Married    Spouse name: Not on file   Number of children: 3   Years of education: Not on file   Highest education level: Not on file  Occupational History    Employer: [employer]  Tobacco Use   Smoking status: Never   Smokeless tobacco: Never  Vaping Use   Vaping Use: Never used  Substance and Sexual Activity    Alcohol use: Yes    Alcohol/week: 0.0 - 1.0 standard drinks of alcohol    Comment: Beer sometimes occasionally.   Drug use: No   Sexual activity: Not on file  Other Topics Concern   Not on file  Social History Narrative   Financial assistance approved for 100% discount at [facility] and has [facility] card   [facility] March 03, 2010   Social Determinants of Health   Financial Resource Strain: Not on file  Food Insecurity: Not on file  Transportation Needs: Not on file  Physical Activity: Not on file  Stress: Not on file  Social Connections: Not on file  Intimate Partner Violence: Not on file   Family History  Problem Relation Age of Onset   Uterine cancer Mother 49   Diabetes Father    Lung cancer Brother    Crohn's disease Son    Hypertension Other        uncle   Colon cancer Neg Hx    Colon polyps Neg Hx    Esophageal cancer Neg Hx    Rectal cancer Neg Hx    Stomach cancer Neg Hx  Current Outpatient Medications  Medication Sig Dispense Refill   albuterol (VENTOLIN HFA) 108 (90 Base) MCG/ACT inhaler Inhale 1-2 puffs into the lungs every 6 (six) hours as needed for wheezing or shortness of breath. 1 Inhaler 11   amLODipine (NORVASC) 5 MG tablet Take 5 mg by mouth daily.     BINAXNOW COVID-19 AG HOME TEST KIT See admin instructions.     diclofenac Sodium (VOLTAREN) 1 % GEL Apply 2 g topically 4 (four) times daily. 100 g 0   etodolac (LODINE) 300 MG capsule Take 300 mg by mouth daily.     ezetimibe (ZETIA) 10 MG tablet Take 10 mg by mouth daily.     hydrochlorothiazide (HYDRODIURIL) 25 MG tablet Take 25 mg by mouth every morning.     levocetirizine (XYZAL) 5 MG tablet SMARTSIG:1 Tablet(s) By Mouth Every Evening     lidocaine (LIDODERM) 5 % Place 1 patch onto the skin daily. Remove & Discard patch within 12 hours or as directed by MD 30 patch 0   naloxone (NARCAN) nasal spray 4 mg/0.1 mL SMARTSIG:1 Spray(s) Both Nares PRN     oxyCODONE-acetaminophen (PERCOCET) 7.5-325 MG  tablet Take 1 tablet by mouth every 4 (four) hours as needed for severe pain.     pantoprazole (PROTONIX) 40 MG tablet Take 40 mg by mouth daily.     polyethylene glycol-electrolytes (NULYTELY) 420 g solution See admin instructions.     rosuvastatin (CRESTOR) 40 MG tablet Take 40 mg by mouth at bedtime.     tamsulosin (FLOMAX) 0.4 MG CAPS capsule Take 0.4 mg by mouth daily.     No current facility-administered medications for this visit.    No Known Allergies   REVIEW OF SYSTEMS:  _0  denotes positive finding, _1  denotes negative finding Cardiac  Comments:  Chest pain or chest pressure:    Shortness of breath upon exertion:    Short of breath when lying flat:    Irregular heart rhythm:        Vascular    Pain in calf, thigh, or hip brought on by ambulation:    Pain in feet at night that wakes you up from your sleep:     Blood clot in your veins:    Leg swelling:         Pulmonary    Oxygen at home:    Productive cough:     Wheezing:         Neurologic    Sudden weakness in arms or legs:     Sudden numbness in arms or legs:     Sudden onset of difficulty speaking or slurred speech:    Temporary loss of vision in one eye:     Problems with dizziness:         Gastrointestinal    Blood in stool:     Vomited blood:         Genitourinary    Burning when urinating:     Blood in urine:        Psychiatric    Major depression:         Hematologic    Bleeding problems:    Problems with blood clotting too easily:        Skin    Rashes or ulcers:        Constitutional    Fever or chills:      PHYSICAL EXAMINATION:  There were no vitals filed for this visit.  General:  WDWN in NAD; vital signs  documented above Gait: Not observed HENT: WNL, normocephalic Pulmonary: normal non-labored breathing , without Rales, rhonchi,  wheezing Cardiac: regular HR Abdomen: soft, NT, no masses Skin: without rashes Vascular Exam/Pulses:  Right Left  Radial 2+ (normal) 2+  (normal)  Ulnar    Femoral    Popliteal    DP 2+ (normal) 2+ (normal)  PT 2+ (normal) 2+ (normal)   Extremities: without ischemic changes, without Gangrene , without cellulitis; without open wounds;  Musculoskeletal: no muscle wasting or atrophy  Neurologic: A&O X 3;  No focal weakness or paresthesias are detected Psychiatric:  The pt has Normal affect.     Non-Invasive Vascular Imaging:   Summary:  Right:  - Venous reflux is noted in the right sapheno-femoral junction.     - Deep venous reflux in the femoral and popliteal veins. Superficial  venous reflux noted in the GSV and varicosities.        ASSESSMENT/PLAN:: 66 y.o. male presenting with right lower extremity varicose veins with mild pain and swelling.  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  Duplex ultrasound demonstrated deep and superficial venous reflux extending through the greater saphenous vein and into large varicosities in the calf.  CEAP score 4.  Fortunately, Tylin's symptoms are mild.  He has not tried compression and elevation.  We discussed the importance of both of these therapies.  He was measured for compression stockings today in clinic.  My plan is to have him back in the office in 3 months to discuss any change in symptoms.  Should he require surgical intervention, this would likely need to occur in the operating room to the size of the varicosities.   Broadus John, MD [facility] [phone]

## 2022-01-12 ENCOUNTER — Encounter: Payer: Self-pay | Admitting: Vascular Surgery

## 2022-01-12 ENCOUNTER — Ambulatory Visit (INDEPENDENT_AMBULATORY_CARE_PROVIDER_SITE_OTHER): Payer: Medicare Other | Admitting: Vascular Surgery

## 2022-01-12 VITALS — BP 155/102 | HR 84 | Temp 98.0°F | Resp 20 | Ht 66.0 in | Wt 273.4 lb

## 2022-01-12 DIAGNOSIS — I872 Venous insufficiency (chronic) (peripheral): Secondary | ICD-10-CM | POA: Diagnosis not present

## 2022-04-18 ENCOUNTER — Ambulatory Visit: Payer: 59 | Admitting: Vascular Surgery

## 2022-08-22 ENCOUNTER — Encounter: Payer: Self-pay | Admitting: Vascular Surgery

## 2022-08-22 ENCOUNTER — Ambulatory Visit (INDEPENDENT_AMBULATORY_CARE_PROVIDER_SITE_OTHER): Payer: 59 | Admitting: Vascular Surgery

## 2022-08-22 VITALS — BP 146/99 | HR 84 | Temp 99.1°F | Resp 18 | Ht 66.0 in | Wt 278.3 lb

## 2022-08-22 DIAGNOSIS — I83811 Varicose veins of right lower extremities with pain: Secondary | ICD-10-CM

## 2022-08-22 DIAGNOSIS — I872 Venous insufficiency (chronic) (peripheral): Secondary | ICD-10-CM | POA: Diagnosis not present

## 2022-08-22 NOTE — Progress Notes (Signed)
REASON FOR VISIT:   Follow-up of chronic venous insufficiency.  MEDICAL ISSUES:   CHRONIC VENOUS INSUFFICIENCY: This patient has CEAP C4 venous disease (hyperpigmentation).  He has been elevating his legs and wearing his compression stockings.  We discussed the importance of trying to avoid prolonged sitting and standing.  We also discussed the importance of maintaining a healthy weight as central obesity especially increases lower extremity venous pressure.  We discussed the proper positioning of leg elevation.  Given that he is having persistent symptoms I think he would be a good candidate for ligation and stripping of his right great saphenous vein and 10-20 stab.  Given the enormous size of the vein with tortuosity I do not think he is a good candidate for laser ablation.  This would have to be done in the operating room.  I think this could be done as an outpatient.  He will need to stay off work for about a week.  Would put on a compression dressing which would stay for 48 hours and then he have to wear his thigh-high stocking for 2 weeks during the day.  It he will discuss this with his family and call if he elects to proceed with scheduling surgery.    HPI:   [name] [name] is a pleasant 67 y.o. male who was seen by Dr. 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  His symptoms were more significant on the right side.  It had varicose veins for over 25 years.  They have gradually been increasing in size.  He has pain associated with these varicosities.  He denies any history of venous ulcers or bleeding.  He describes aching pain and heaviness in his legs at the end of the day.  He also was having some right lower extremity swelling.  He has no previous history of DVT and no previous venous procedures.  He had some very large varicose veins in his medial right calf.  He had hyperpigmentation suggesting longstanding venous insufficiency.  He was encouraged  to wear thigh-high compression stockings, elevate his legs, and exercise.  He comes in for follow-up visit.  It was felt that if he did require intervention given the size of the varicosities this would likely have to be done in the operating room.  My history, the patient does note some aching and heaviness in his varicosities when he is standing.  He works at [employer].  Symptoms are relieved somewhat with elevation.  He has been wearing his thigh-high compression stockings which help some.  He denies any previous history of DVT or phlebitis.  Past Medical History:  Diagnosis Date   Allergy    Anemia    not on medications   Arthritis    Asthma    Colon polyps    noted 10/2011 colonoscopy   Cough 02/14/2016   COVID    mild case   Diverticulosis    mild noted 10/2011   GERD (gastroesophageal reflux disease)    on meds   Hiatal hernia    small noted 10/2011   Hiatal hernia    small   HLD (hyperlipidemia)    not on meds   Hypertension    not on meds   Mild intermittent asthma 12/24/2017   Parasomnia    movement with sleep   Pyrosis    Seasonal allergies    Sleep apnea    NO CPAP   Varicose veins of both lower extremities with  pain    right>left    Family History  Problem Relation Age of Onset   Uterine cancer Mother 49   Diabetes Father    Lung cancer Brother    Crohn's disease Son    Hypertension Other        uncle   Colon cancer Neg Hx    Colon polyps Neg Hx    Esophageal cancer Neg Hx    Rectal cancer Neg Hx    Stomach cancer Neg Hx     SOCIAL HISTORY: Social History   Tobacco Use   Smoking status: Never   Smokeless tobacco: Never  Substance Use Topics   Alcohol use: Yes    Alcohol/week: 0.0 - 1.0 standard drinks of alcohol    Comment: Beer sometimes occasionally.    No Known Allergies  Current Outpatient Medications  Medication Sig Dispense Refill   albuterol (VENTOLIN HFA) 108 (90 Base) MCG/ACT inhaler Inhale 1-2 puffs into the lungs every 6  (six) hours as needed for wheezing or shortness of breath. 1 Inhaler 11   amLODipine (NORVASC) 5 MG tablet Take 5 mg by mouth daily.     diclofenac Sodium (VOLTAREN) 1 % GEL Apply 2 g topically 4 (four) times daily. 100 g 0   etodolac (LODINE) 300 MG capsule Take 300 mg by mouth daily.     ezetimibe (ZETIA) 10 MG tablet Take 10 mg by mouth daily.     hydrochlorothiazide (HYDRODIURIL) 25 MG tablet Take 25 mg by mouth every morning.     levocetirizine (XYZAL) 5 MG tablet SMARTSIG:1 Tablet(s) By Mouth Every Evening     lidocaine (LIDODERM) 5 % Place 1 patch onto the skin daily. Remove & Discard patch within 12 hours or as directed by MD 30 patch 0   naloxone (NARCAN) nasal spray 4 mg/0.1 mL SMARTSIG:1 Spray(s) Both Nares PRN     oxyCODONE-acetaminophen (PERCOCET) 7.5-325 MG tablet Take 1 tablet by mouth every 4 (four) hours as needed for severe pain.     pantoprazole (PROTONIX) 40 MG tablet Take 40 mg by mouth daily.     polyethylene glycol-electrolytes (NULYTELY) 420 g solution See admin instructions.     rosuvastatin (CRESTOR) 40 MG tablet Take 40 mg by mouth at bedtime.     tamsulosin (FLOMAX) 0.4 MG CAPS capsule Take 0.4 mg by mouth daily.     No current facility-administered medications for this visit.    REVIEW OF SYSTEMS:  [X]  denotes positive finding, [ ]  denotes negative finding Cardiac  Comments:  Chest pain or chest pressure:    Shortness of breath upon exertion:    Short of breath when lying flat:    Irregular heart rhythm:        Vascular    Pain in calf, thigh, or hip brought on by ambulation:    Pain in feet at night that wakes you up from your sleep:     Blood clot in your veins:    Leg swelling:         Pulmonary    Oxygen at home:    Productive cough:     Wheezing:         Neurologic    Sudden weakness in arms or legs:     Sudden numbness in arms or legs:     Sudden onset of difficulty speaking or slurred speech:    Temporary loss of vision in one eye:      Problems with dizziness:  Gastrointestinal    Blood in stool:     Vomited blood:         Genitourinary    Burning when urinating:     Blood in urine:        Psychiatric    Major depression:         Hematologic    Bleeding problems:    Problems with blood clotting too easily:        Skin    Rashes or ulcers:        Constitutional    Fever or chills:     PHYSICAL EXAM:   There were no vitals filed for this visit.  GENERAL: The patient is a well-nourished male, in no acute distress. The vital signs are documented above. CARDIAC: There is a regular rate and rhythm.  VASCULAR: I do not detect carotid bruits. He has palpable pedal pulses. He has markedly enlarged varicosities of his medial right calf as documented in the photograph below.    He also has hyperpigmentation. I did look at his right great saphenous vein myself with the Son[employer].  It is markedly dilated up to 21 mm in the proximal thigh.  Is also significantly tortuous. PULMONARY: There is good air exchange bilaterally without wheezing or rales. ABDOMEN: Soft and non-tender with normal pitched bowel sounds.  MUSCULOSKELETAL: There are no ma jor deformities or cyanosis. NEUROLOGIC: No focal weakness or paresthesias are detected. SKIN: There are no ulcers or rashes noted. PSYCHIATRIC: The patient has a normal affect.  DATA:    VENOUS DUPLEX: I have reviewed the venous duplex scan that was done on 01/11/2022.  This was of the right lower extremity only.  There was no evidence of DVT.  There was deep venous reflux in the common femoral vein, femoral vein, and popliteal vein.  There was significant superficial venous reflux in the right great saphenous vein from the saphenofemoral junction to the knee.  Diameters of the vein ranged from 14 to 21 mm.  The results of the study are summarized in the diagram below.      Waver[facility] [phone]

## 2022-09-18 ENCOUNTER — Encounter: Payer: Self-pay | Admitting: Primary Care

## 2022-09-18 ENCOUNTER — Institutional Professional Consult (permissible substitution): Payer: 59 | Admitting: Primary Care

## 2022-11-06 ENCOUNTER — Encounter: Payer: Self-pay | Admitting: Primary Care

## 2022-11-06 ENCOUNTER — Ambulatory Visit: Payer: 59 | Admitting: Primary Care

## 2022-11-06 VITALS — BP 132/68 | HR 80 | Ht 66.0 in | Wt 272.0 lb

## 2022-11-06 DIAGNOSIS — J4520 Mild intermittent asthma, uncomplicated: Secondary | ICD-10-CM

## 2022-11-06 DIAGNOSIS — G4733 Obstructive sleep apnea (adult) (pediatric): Secondary | ICD-10-CM | POA: Diagnosis not present

## 2022-11-06 DIAGNOSIS — R0683 Snoring: Secondary | ICD-10-CM | POA: Diagnosis not present

## 2022-11-06 MED ORDER — ALBUTEROL SULFATE HFA 108 (90 BASE) MCG/ACT IN AERS: 2.0000 | INHALATION_SPRAY | Freq: Four times a day (QID) | RESPIRATORY_TRACT | 2 refills | Status: AC | PRN

## 2022-11-06 NOTE — Progress Notes (Signed)
@Patient  ID: [name] [name], male    DOB: [birth year], 67 y.o.   MRN: [id number]  Chief Complaint  Patient presents with   Consult    Pt has had a sleep study in 2013, pt snores, daytime sleepiness.     Referring provider: Loura Back, NP  HPI: 67 year old male, never smoked.  Past medical history significant for hypertension, OSA, mild asthma, GERD, dyslipidemia, GERD.  11/06/2022 Patient has a history of sleep apnea.  Baseline diagnostic and PSG on 5/19 2013 showed severe obstructive sleep apnea, AHI 48.1/h (body weight 226 pounds) he underwent CPAP titration study in 2013, CPAP was titrated to 10cm h20 with residual AHI 3.2/hour. Unclear why but he was never started on CPAP. He reports falling asleep easily when inactive, sometimes while driving. He has been told that he snores loudly. Typical bedtime is 9-10pm. He falls asleep right away. He wakes up on average once during the night. He starts his day at 7-8am. No overt concern for narcolepsy, cataplexy or sleep walking. Asthma is well controlled, needs refill Albuterol.   No Known Allergies  Immunization History  Administered Date(s) Administered   Influenza Split 02/05/2012   Influenza,inj,Quad PF,6+ Mos 12/16/2012, 01/07/2014, 02/14/2016, 01/01/2017   Tdap 01/07/2014    Past Medical History:  Diagnosis Date   Allergy    Anemia    not on medications   Arthritis    Asthma    Colon polyps    noted 10/2011 colonoscopy   Cough 02/14/2016   COVID    mild case   Diverticulosis    mild noted 10/2011   GERD (gastroesophageal reflux disease)    on meds   Hiatal hernia    small noted 10/2011   Hiatal hernia    small   HLD (hyperlipidemia)    not on meds   Hypertension    not on meds   Mild intermittent asthma 12/24/2017   Parasomnia    movement with sleep   Pyrosis    Seasonal allergies    Sleep apnea    NO CPAP   Varicose veins of both lower extremities with pain    right>left    Tobacco History: Social  History   Tobacco Use  Smoking Status Never  Smokeless Tobacco Never   Counseling given: Not Answered   Outpatient Medications Prior to Visit  Medication Sig Dispense Refill   amLODipine (NORVASC) 5 MG tablet Take 5 mg by mouth daily.     diclofenac Sodium (VOLTAREN) 1 % GEL Apply 2 g topically 4 (four) times daily. 100 g 0   etodolac (LODINE) 300 MG capsule Take 300 mg by mouth daily.     ezetimibe (ZETIA) 10 MG tablet Take 10 mg by mouth daily.     hydrochlorothiazide (HYDRODIURIL) 25 MG tablet Take 25 mg by mouth every morning.     levocetirizine (XYZAL) 5 MG tablet SMARTSIG:1 Tablet(s) By Mouth Every Evening     lidocaine (LIDODERM) 5 % Place 1 patch onto the skin daily. Remove & Discard patch within 12 hours or as directed by MD 30 patch 0   naloxone (NARCAN) nasal spray 4 mg/0.1 mL SMARTSIG:1 Spray(s) Both Nares PRN     oxyCODONE-acetaminophen (PERCOCET) 7.5-325 MG tablet Take 1 tablet by mouth every 4 (four) hours as needed for severe pain.     pantoprazole (PROTONIX) 40 MG tablet Take 40 mg by mouth daily.     polyethylene glycol-electrolytes (NULYTELY) 420 g solution See admin instructions.  rosuvastatin (CRESTOR) 40 MG tablet Take 40 mg by mouth at bedtime.     tamsulosin (FLOMAX) 0.4 MG CAPS capsule Take 0.4 mg by mouth daily.     albuterol (VENTOLIN HFA) 108 (90 Base) MCG/ACT inhaler Inhale 1-2 puffs into the lungs every 6 (six) hours as needed for wheezing or shortness of breath. 1 Inhaler 11   No facility-administered medications prior to visit.   Review of Systems  Review of Systems  Constitutional:  Positive for fatigue.  HENT: Negative.    Respiratory: Negative.    Cardiovascular: Negative.   Psychiatric/Behavioral:  Positive for sleep disturbance.    Physical Exam  BP 132/68   Pulse 80   Ht 5\' 6"  (1.676 m)   Wt 272 lb (123.4 kg)   SpO2 99%   BMI 43.90 kg/m  Physical Exam Constitutional:      Appearance: Normal appearance.  HENT:     Head:  Normocephalic and atraumatic.  Cardiovascular:     Rate and Rhythm: Normal rate and regular rhythm.  Pulmonary:     Effort: Pulmonary effort is normal.     Breath sounds: Normal breath sounds. No wheezing, rhonchi or rales.  Musculoskeletal:        General: Normal range of motion.  Skin:    General: Skin is warm.  Neurological:     General: No focal deficit present.     Mental Status: He is alert and oriented to person, place, and time. Mental status is at baseline.  Psychiatric:        Mood and Affect: Mood normal.        Behavior: Behavior normal.        Thought Content: Thought content normal.        Judgment: Judgment normal.      Lab Results:  CBC    Component Value Date/Time   WBC 6.0 08/19/2019 2232   RBC 4.05 (L) 08/19/2019 2232   HGB 12.0 (L) 08/19/2019 2232   HGB 12.7 (L) 06/26/2016 1626   HCT 37.7 (L) 08/19/2019 2232   HCT 37.4 (L) 06/26/2016 1626   PLT 192 08/19/2019 2232   PLT 227 06/26/2016 1626   MCV 93.1 08/19/2019 2232   MCV 88 06/26/2016 1626   MCH 29.6 08/19/2019 2232   MCHC 31.8 08/19/2019 2232   RDW 13.1 08/19/2019 2232   RDW 13.8 06/26/2016 1626   LYMPHSABS 2.0 11/25/2014 1430   MONOABS 0.4 07/15/2014 1029   EOSABS 0.4 11/25/2014 1430   BASOSABS 0.0 11/25/2014 1430    BMET    Component Value Date/Time   NA 140 08/19/2019 2232   NA 143 03/28/2018 1442   K 3.7 08/19/2019 2232   CL 104 08/19/2019 2232   CO2 22 08/19/2019 2232   GLUCOSE 101 (H) 08/19/2019 2232   BUN 21 08/19/2019 2232   BUN 15 03/28/2018 1442   CREATININE 1.20 08/19/2019 2232   CREATININE 0.95 07/15/2014 1029   CALCIUM 9.4 08/19/2019 2232   GFRNONAA >60 08/19/2019 2232   GFRNONAA 88 07/15/2014 1029   GFRAA >60 08/19/2019 2232   GFRAA >89 07/15/2014 1029    BNP    Component Value Date/Time   BNP 3.0 04/30/2011 1236    ProBNP No results found for: "PROBNP"  Imaging: No results found.   Assessment & Plan:   OSA (obstructive sleep apnea) - Patient has a  history of severe obstructive sleep apnea, never started on CPAP.  Patient reports symptoms of excessive daytime sleepiness and loud snoring.  Epworth score 24.  BMI 43.  Needs home sleep study reestablish diagnosis of OSA.  Encourage side sleeping position and weight loss efforts.  Advised against driving if experiencing excessive daytime sleepiness fatigue. Recommend patient follow-up in 6-8 weeks to review sleep study results and discuss CPAP start or sooner if able.  Mild intermittent asthma - Stable; Refill Albuterol hfa 2 puffs every 4-6 hours as needed for shortness of breath/wheezing    Glenford Bayley, NP 11/06/2022

## 2022-11-06 NOTE — Progress Notes (Signed)
Reviewed and agree with assessment/plan.   Coralyn Helling, MD [facility] 11/06/2022, 1:03 PM Pager:  [phone]

## 2022-11-06 NOTE — Patient Instructions (Addendum)
Recommendations Focus on side-sitting position Work on weight loss efforts as able Do not drive experiencing excessive daytime sleepiness fatigue  Orders: Home sleep study (ordered)  Follow-up: 8 weeks with Beth NP (in person or virtual)  Sleep Apnea Sleep apnea affects breathing during sleep. It causes breathing to stop for 10 seconds or more, or to become shallow. People with sleep apnea usually snore loudly. It can also increase the risk of: Heart attack. Stroke. Being very overweight (obese). Diabetes. Heart failure. Irregular heartbeat. High blood pressure. The goal of treatment is to help you breathe normally again. What are the causes?  The most common cause of this condition is a collapsed or blocked airway. There are three kinds of sleep apnea: Obstructive sleep apnea. This is caused by a blocked or collapsed airway. Central sleep apnea. This happens when the brain does not send the right signals to the muscles that control breathing. Mixed sleep apnea. This is a combination of obstructive and central sleep apnea. What increases the risk? Being overweight. Smoking. Having a small airway. Being older. Being male. Drinking alcohol. Taking medicines to calm yourself (sedatives or tranquilizers). Having family members with the condition. Having a tongue or tonsils that are larger than normal. What are the signs or symptoms? Trouble staying asleep. Loud snoring. Headaches in the morning. Waking up gasping. Dry mouth or sore throat in the morning. Being sleepy or tired during the day. If you are sleepy or tired during the day, you may also: Not be able to focus your mind (concentrate). Forget things. Get angry a lot and have mood swings. Feel sad (depressed). Have changes in your personality. Have less interest in sex, if you are male. Be unable to have an erection, if you are male. How is this treated?  Sleeping on your side. Using a medicine to get rid  of mucus in your nose (decongestant). Avoiding the use of alcohol, medicines to help you relax, or certain pain medicines (narcotics). Losing weight, if needed. Changing your diet. Quitting smoking. Using a machine to open your airway while you sleep, such as: An oral appliance. This is a mouthpiece that shifts your lower jaw forward. A CPAP device. This device blows air through a mask when you breathe out (exhale). An EPAP device. This has valves that you put in each nostril. A BIPAP device. This device blows air through a mask when you breathe in (inhale) and breathe out. Having surgery if other treatments do not work. Follow these instructions at home: Lifestyle Make changes that your doctor recommends. Eat a healthy diet. Lose weight if needed. Avoid alcohol, medicines to help you relax, and some pain medicines. Do not smoke or use any products that contain nicotine or tobacco. If you need help quitting, ask your doctor. General instructions Take over-the-counter and prescription medicines only as told by your doctor. If you were given a machine to use while you sleep, use it only as told by your doctor. If you are having surgery, make sure to tell your doctor you have sleep apnea. You may need to bring your device with you. Keep all follow-up visits. Contact a doctor if: The machine that you were given to use during sleep bothers you or does not seem to be working. You do not get better. You get worse. Get help right away if: Your chest hurts. You have trouble breathing in enough air. You have an uncomfortable feeling in your back, arms, or stomach. You have trouble talking. One side of  your body feels weak. A part of your face is hanging down. These symptoms may be an emergency. Get help right away. Call your local emergency services (911 in the [place] Do not wait to see if the symptoms will go away. Do not drive yourself to the hospital. Summary This condition affects  breathing during sleep. The most common cause is a collapsed or blocked airway. The goal of treatment is to help you breathe normally while you sleep. This information is not intended to replace advice given to you by your health care provider. Make sure you discuss any questions you have with your health care provider. Document Revised: 10/19/2020 Document Reviewed: 02/19/2020 Elsevier Patient Education  2024 [employer].

## 2022-11-06 NOTE — Assessment & Plan Note (Signed)
-   Stable; Refill Albuterol hfa 2 puffs every 4-6 hours as needed for shortness of breath/wheezing

## 2022-11-06 NOTE — Assessment & Plan Note (Addendum)
-   Patient has a history of severe obstructive sleep apnea, never started on CPAP.  Patient reports symptoms of excessive daytime sleepiness and loud snoring.  Epworth score 24.  BMI 43.  Needs home sleep study reestablish diagnosis of OSA.  Encourage side sleeping position and weight loss efforts.  Advised against driving if experiencing excessive daytime sleepiness fatigue. Recommend patient follow-up in 6-8 weeks to review sleep study results and discuss CPAP start or sooner if able.

## 2022-11-22 ENCOUNTER — Ambulatory Visit: Payer: 59 | Admitting: Podiatry

## 2022-12-03 ENCOUNTER — Telehealth: Payer: Self-pay | Admitting: Primary Care

## 2022-12-03 NOTE — Telephone Encounter (Signed)
New message   Last office visit  11/06/22 - Home sleep study (ordered)   The patient at the front desk has not received a sleep machine.   The patient is asking for a call back to discuss this.

## 2022-12-06 NOTE — Telephone Encounter (Signed)
Plan per last OV Needs home sleep study reestablish diagnosis of OSA.  Encourage side sleeping position and weight loss efforts.  Advised against driving if experiencing excessive daytime sleepiness fatigue. Recommend patient follow-up in 6-8 weeks to review sleep study results and discuss CPAP start or sooner if able.   No notes to order CPAP and no results of sleep study found. LMOM for pt to return call.

## 2022-12-17 ENCOUNTER — Telehealth: Payer: Self-pay | Admitting: *Deleted

## 2022-12-17 NOTE — Telephone Encounter (Signed)
Called and spoke with patient, he states he has not had the HST completed, no one ever called him to schedule the test.  Advised him that we would need to cancel his appointment for tomorrow and he will need to call to schedule a follow up after the test is complete for 2 weeks after the test is completed.  He verbalized understanding.  PCCs, can you check to see why this patient has not been called and scheduled?  Thank you.

## 2022-12-18 ENCOUNTER — Ambulatory Visit: Payer: 59 | Admitting: Primary Care

## 2022-12-18 NOTE — Telephone Encounter (Signed)
I spoke with [name] [name] with [employer] she stated the spoke with patient on 8/15 let him know he would have to pay $20 copay and he was to call them back. 8/19 LVM, 8/20 text patient, 9/9 text patient, 12/06/22 device was mailed to patient and 12/13/22 returned with blank test. It looks like it was returned unused

## 2023-01-18 ENCOUNTER — Ambulatory Visit: Payer: 59 | Admitting: Podiatry

## 2023-01-24 ENCOUNTER — Ambulatory Visit (HOSPITAL_COMMUNITY)
Admission: EM | Admit: 2023-01-24 | Discharge: 2023-01-24 | Disposition: A | Discharge status: Home / Self Care | Payer: 59 | Attending: Internal Medicine | Admitting: Internal Medicine

## 2023-01-24 ENCOUNTER — Encounter (HOSPITAL_COMMUNITY): Payer: Self-pay

## 2023-01-24 DIAGNOSIS — I8311 Varicose veins of right lower extremity with inflammation: Secondary | ICD-10-CM

## 2023-01-24 MED ORDER — AMOXICILLIN-POT CLAVULANATE 875-125 MG PO TABS: 1.0000 | ORAL_TABLET | Freq: Two times a day (BID) | ORAL | 0 refills | Status: DC

## 2023-01-24 NOTE — ED Triage Notes (Signed)
Pt c/o RLE pain, swelling, tightness, and busted veins x2-3 wks. States taking tylenol with no relief.

## 2023-01-24 NOTE — ED Provider Notes (Addendum)
[facility]    CSN: [id number] Arrival date & time: 01/24/23  1546      History   Chief Complaint Chief Complaint  Patient presents with   Leg Pain    HPI [name] [name] is a 67 y.o. male.   [name] [name] is a 67 y.o. male presenting for chief complaint of Leg Pain that started approximately 2 weeks ago.  He has a history of varicose veins to bilateral lower extremities and states that the swelling is increasing definitely over the last 2 to 3 days.  The area of concern and pain has become warm to touch.  Denies recent travel, shortness of breath, chest discomfort, fevers, chills, and recent trauma/injury to the bilateral lower extremities.  Pain has become so significant that he has a difficult time bearing weight on the right leg.  History of chronic pain and takes Percocet as prescribed by [facility] pain clinic.  He has outstanding co-pays at [employer] and they are currently declining to see the patient and refill his pain medication until he is able to pay his co-pay leaving him without any pain relief.     Past Medical History:  Diagnosis Date   Allergy    Anemia    not on medications   Arthritis    Asthma    Colon polyps    noted 10/2011 colonoscopy   Cough 02/14/2016   COVID    mild case   Diverticulosis    mild noted 10/2011   GERD (gastroesophageal reflux disease)    on meds   Hiatal hernia    small noted 10/2011   Hiatal hernia    small   HLD (hyperlipidemia)    not on meds   Hypertension    not on meds   Mild intermittent asthma 12/24/2017   Parasomnia    movement with sleep   Pyrosis    Seasonal allergies    Sleep apnea    NO CPAP   Varicose veins of both lower extremities with pain    right>left    Patient Active Problem List   Diagnosis Date Noted   Advice given about COVID-19 virus by telephone 11/07/2018   Erectile dysfunction due to diseases classified elsewhere 03/28/2018   Mild intermittent asthma  12/24/2017   Gait disturbance 07/19/2016   Seasonal allergies 01/07/2014   Obesity 11/19/2011   Health care maintenance 11/11/2011   GERD (gastroesophageal reflux disease) 08/15/2011   OSA (obstructive sleep apnea) 06/21/2011    Class: Chronic   Dyslipidemia 07/05/2009   Essential hypertension 07/05/2009   VARICOSE VEINS, LOWER EXTREMITIES 07/05/2009    Past Surgical History:  Procedure Laterality Date   COLONOSCOPY  03/27/2011   polyps   COLONOSCOPY  2017   DJ-MAC-suprep(adequate)-high risk adenoma   POLYPECTOMY         Home Medications    Prior to Admission medications   Medication Sig Start Date End Date Taking? Authorizing Provider  amoxicillin-clavulanate (AUGMENTIN) 875-125 MG tablet Take 1 tablet by mouth every 12 (twelve) hours. 01/24/23  Yes Carlisle Beers, FNP  albuterol (VENTOLIN HFA) 108 (90 Base) MCG/ACT inhaler Inhale 2 puffs into the lungs every 6 (six) hours as needed for wheezing or shortness of breath. 11/06/22   Glenford Bayley, NP  amLODipine (NORVASC) 5 MG tablet Take 5 mg by mouth daily.    [provider]  diclofenac Sodium (VOLTAREN) 1 % GEL Apply 2 g topically 4 (four) times daily. 12/25/21  Jeannie Fend, PA-C  etodolac (LODINE) 300 MG capsule Take 300 mg by mouth daily. 05/11/21   [provider]  ezetimibe (ZETIA) 10 MG tablet Take 10 mg by mouth daily. 06/14/21   [provider]  hydrochlorothiazide (HYDRODIURIL) 25 MG tablet Take 25 mg by mouth every morning. 06/09/21   [provider]  levocetirizine (XYZAL) 5 MG tablet SMARTSIG:1 Tablet(s) By Mouth Every Evening 06/09/21   [provider]  lidocaine (LIDODERM) 5 % Place 1 patch onto the skin daily. Remove & Discard patch within 12 hours or as directed by MD 12/25/21   Jeannie Fend, PA-C  naloxone [facility]) nasal spray 4 mg/0.1 mL SMARTSIG:1 Spray(s) Both Nares PRN 06/02/21   [provider]  oxyCODONE-acetaminophen (PERCOCET) 7.5-325 MG  tablet Take 1 tablet by mouth every 4 (four) hours as needed for severe pain.    [provider]  pantoprazole (PROTONIX) 40 MG tablet Take 40 mg by mouth daily.    [provider]  polyethylene glycol-electrolytes (NULYTELY) 420 g solution See admin instructions. 05/02/21   [provider]  rosuvastatin (CRESTOR) 40 MG tablet Take 40 mg by mouth at bedtime. 05/11/21   [provider]  tamsulosin (FLOMAX) 0.4 MG CAPS capsule Take 0.4 mg by mouth daily. 06/09/21   [provider]    Family History Family History  Problem Relation Age of Onset   Uterine cancer Mother 72   Diabetes Father    Lung cancer Brother    Crohn's disease Son    Hypertension Other        uncle   Colon cancer Neg Hx    Colon polyps Neg Hx    Esophageal cancer Neg Hx    Rectal cancer Neg Hx    Stomach cancer Neg Hx     Social History Social History   Tobacco Use   Smoking status: Never   Smokeless tobacco: Never  Vaping Use   Vaping status: Never Used  Substance Use Topics   Alcohol use: Yes    Alcohol/week: 0.0 - 1.0 standard drinks of alcohol    Comment: Beer sometimes occasionally.   Drug use: No     Allergies   Patient has no known allergies.   Review of Systems Review of Systems Per HPI  Physical Exam Triage Vital Signs ED Triage Vitals  Encounter Vitals Group     BP 01/24/23 1641 (!) 150/98     Systolic BP Percentile --      Diastolic BP Percentile --      Pulse Rate 01/24/23 1641 82     Resp 01/24/23 1641 18     Temp 01/24/23 1641 98.3 F (36.8 C)     Temp Source 01/24/23 1641 Oral     SpO2 01/24/23 1641 96 %     Weight --      Height --      Head Circumference --      Peak Flow --      Pain Score 01/24/23 1642 10     Pain Loc --      Pain Education --      Exclude from Growth Chart --    No data found.  Updated Vital Signs BP (!) 150/98 (BP Location: Left Arm)   Pulse 82   Temp 98.3 F (36.8 C) (Oral)   Resp 18   SpO2 96%    Visual Acuity Right Eye Distance:   Left Eye Distance:   Bilateral Distance:  Right Eye Near:   Left Eye Near:    Bilateral Near:     Physical Exam Vitals and nursing note reviewed.  Constitutional:      Appearance: He is not ill-appearing or toxic-appearing.  HENT:     Head: Normocephalic and atraumatic.     Right Ear: Hearing and external ear normal.     Left Ear: Hearing and external ear normal.     Nose: Nose normal.     Mouth/Throat:     Lips: Pink.  Eyes:     General: Lids are normal. Vision grossly intact. Gaze aligned appropriately.     Extraocular Movements: Extraocular movements intact.     Conjunctiva/sclera: Conjunctivae normal.  Cardiovascular:     Pulses:          Posterior tibial pulses are 2+ on the right side and 2+ on the left side.     Comments: Significant varicose veins present to the right lower extremity.  Inflamed and swollen cluster of varicose veins to the medial right calf, very tender. Minimal erythema, warm to palpation. Negative Homans' sign bilaterally.  +2 bilateral anterior tibialis pulses. Pulmonary:     Effort: Pulmonary effort is normal.  Musculoskeletal:     Cervical back: Neck supple.     Right lower leg: 1+ Edema present.     Left lower leg: No edema.  Skin:    General: Skin is warm and dry.     Capillary Refill: Capillary refill takes less than 2 seconds.     Findings: No rash.  Neurological:     General: No focal deficit present.     Mental Status: He is alert and oriented to person, place, and time. Mental status is at baseline.     Cranial Nerves: No dysarthria or facial asymmetry.  Psychiatric:        Mood and Affect: Mood normal.        Speech: Speech normal.        Behavior: Behavior normal.        Thought Content: Thought content normal.        Judgment: Judgment normal.   Right proximal medial calf      UC Treatments / Results  Labs (all labs ordered are listed, but only abnormal results are  displayed) Labs Reviewed - No data to display  EKG   Radiology No results found.  Procedures Procedures (including critical care time)  Medications Ordered in UC Medications - No data to display  Initial Impression / Assessment and Plan / UC Course  I have reviewed the triage vital signs and the nursing notes.  Pertinent labs & imaging results that were available during my care of the patient were reviewed by me and considered in my medical decision making (see chart for details).   1.  Varicose veins of right lower extremity with inflammation Varicose veins with significant inflammation. Would like to cover for cellulitis/infection with Augmentin BID for 7 days. Warm compresses to improve blood flow. Compression stockings. Low suspicion for DVT. Patient given walking referral to vascular surgery for follow-up. Encouraged follow-up with PCP as well.  Patient requesting pain medication today.  He is established with [facility] medical center for management of his chronic pain related to varicose veins.  Unfortunately, I am unable to prescribe/treat chronic pain in the urgent care setting.  Advised to call PCP and vascular surgery to discuss this further.  Discussed case with Dr. Leonides Grills attending who agrees with plan.  Counseled patient on potential  for adverse effects with medications prescribed/recommended today, strict ER and return-to-clinic precautions discussed, patient verbalized understanding.    Final Clinical Impressions(s) / UC Diagnoses   Final diagnoses:  Varicose veins of right lower extremity with inflammation     Discharge Instructions      Warm compresses.  Take antibiotics as prescribed. Schedule an appointment for follow-up with primary care provider as well as vascular surgeon listed on your paperwork.     ED Prescriptions     Medication Sig Dispense Auth. Provider   amoxicillin-clavulanate (AUGMENTIN) 875-125 MG tablet Take 1 tablet by mouth  every 12 (twelve) hours. 14 tablet Carlisle Beers, FNP      I have reviewed the PDMP during this encounter.   Carlisle Beers, FNP 01/24/23 1729    Carlisle Beers, FNP 01/24/23 1730

## 2023-01-24 NOTE — Discharge Instructions (Addendum)
Warm compresses.  Take antibiotics as prescribed. Schedule an appointment for follow-up with primary care provider as well as vascular surgeon listed on your paperwork.

## 2023-01-29 NOTE — Telephone Encounter (Signed)
Victorino Dike from [facility] called me about this patient. Technically the mailed the device out to the patient 3 times. The first time they didn't have the correct mailing address. They spoke with the patient and confirmed the correct mailing address. The device was shipped out on 12/28/22 and 01/15/2023 both times returned address unknown return to sender. Victorino Dike is not sure what is going on with the patient. Victorino Dike also suggested that maybe we schedule the patient to pick up one of our machines to do the home sleep test

## 2023-01-30 NOTE — Telephone Encounter (Signed)
[name] [name] can you try and get the patient scheduled for [facility]

## 2023-01-30 NOTE — Telephone Encounter (Signed)
Can you relay above message to Mr Clybun. We can have him do one of our HST at our office and pick it up, I'm fine with that Synetta Fail

## 2023-03-04 NOTE — Telephone Encounter (Signed)
Patient no showed to pick up HST machine on 02/07/23

## 2023-04-09 NOTE — Telephone Encounter (Signed)
 ATC x2- line rang for >41min and then received recording that call could not be completed. Will call back.

## 2023-04-09 NOTE — Telephone Encounter (Signed)
 Please see message from [employer] regarding HST.  Thank you.

## 2023-04-09 NOTE — Telephone Encounter (Signed)
 Ok, we've tried several options here. Nothing more to do if patient will not follow through with testing that has been ordered. Thanks

## 2023-07-10 ENCOUNTER — Ambulatory Visit: Admitting: Podiatry

## 2023-08-02 ENCOUNTER — Encounter (HOSPITAL_COMMUNITY): Payer: Self-pay

## 2023-08-02 ENCOUNTER — Ambulatory Visit (INDEPENDENT_AMBULATORY_CARE_PROVIDER_SITE_OTHER)

## 2023-08-02 ENCOUNTER — Ambulatory Visit (HOSPITAL_COMMUNITY)
Admission: EM | Admit: 2023-08-02 | Discharge: 2023-08-02 | Disposition: A | Discharge status: Home / Self Care | Attending: Family Medicine | Admitting: Family Medicine

## 2023-08-02 DIAGNOSIS — M25572 Pain in left ankle and joints of left foot: Secondary | ICD-10-CM

## 2023-08-02 MED ORDER — PREDNISONE 20 MG PO TABS: 40.0000 mg | ORAL_TABLET | Freq: Every day | ORAL | 0 refills | Status: AC

## 2023-08-02 NOTE — Discharge Instructions (Addendum)
 You were seen today for ankle pain.  Your xray appears normal today.  If the radiologist reads this differently we will notify you.  As discussed, I am suspicious for possible gout.  I have sent out an oral steroid to use for pain and swelling.  You may use tylenol  as well for pain.  You should keep the area elevated and use ice as well.  If this does not improve or worsens then please go to the ER for further evaluation.

## 2023-08-02 NOTE — ED Provider Notes (Signed)
 [facility]    CSN: [id number] Arrival date & time: 08/02/23  4098      History   Chief Complaint Chief Complaint  Patient presents with   Foot Swelling    HPI [name] [name] is a 68 y.o. male.   He is here for left foot pain and swelling.  He woke up yesterday morning and the foot was painful and swollen.  Iced it yesterday, last night did not get any sleep due to throbbing.  No known injury.  No known h/o gout.        Past Medical History:  Diagnosis Date   Allergy    Anemia    not on medications   Arthritis    Asthma    Colon polyps    noted 10/2011 colonoscopy   Cough 02/14/2016   COVID    mild case   Diverticulosis    mild noted 10/2011   GERD (gastroesophageal reflux disease)    on meds   Hiatal hernia    small noted 10/2011   Hiatal hernia    small   HLD (hyperlipidemia)    not on meds   Hypertension    not on meds   Mild intermittent asthma 12/24/2017   Parasomnia    movement with sleep   Pyrosis    Seasonal allergies    Sleep apnea    NO CPAP   Varicose veins of both lower extremities with pain    right>left    Patient Active Problem List   Diagnosis Date Noted   Advice given about COVID-19 virus by telephone 11/07/2018   Erectile dysfunction due to diseases classified elsewhere 03/28/2018   Mild intermittent asthma 12/24/2017   Gait disturbance 07/19/2016   Seasonal allergies 01/07/2014   Obesity 11/19/2011   Health care maintenance 11/11/2011   GERD (gastroesophageal reflux disease) 08/15/2011   OSA (obstructive sleep apnea) 06/21/2011    Class: Chronic   Dyslipidemia 07/05/2009   Essential hypertension 07/05/2009   VARICOSE VEINS, LOWER EXTREMITIES 07/05/2009    Past Surgical History:  Procedure Laterality Date   COLONOSCOPY  03/27/2011   polyps   COLONOSCOPY  2017   DJ-MAC-suprep(adequate)-high risk adenoma   POLYPECTOMY         Home Medications    Prior to Admission medications   Medication Sig  Start Date End Date Taking? Authorizing Provider  albuterol  (VENTOLIN  HFA) 108 (90 Base) MCG/ACT inhaler Inhale 2 puffs into the lungs every 6 (six) hours as needed for wheezing or shortness of breath. 11/06/22   Antonio Baumgarten, NP  amLODipine (NORVASC) 5 MG tablet Take 5 mg by mouth daily.    [provider]  amoxicillin -clavulanate (AUGMENTIN ) 875-125 MG tablet Take 1 tablet by mouth every 12 (twelve) hours. 01/24/23   Starlene Eaton, FNP  diclofenac  Sodium (VOLTAREN ) 1 % GEL Apply 2 g topically 4 (four) times daily. 12/25/21   Darlis Eisenmenger, PA-C  etodolac (LODINE) 300 MG capsule Take 300 mg by mouth daily. 05/11/21   [provider]  ezetimibe (ZETIA) 10 MG tablet Take 10 mg by mouth daily. 06/14/21   [provider]  hydrochlorothiazide  (HYDRODIURIL ) 25 MG tablet Take 25 mg by mouth every morning. 06/09/21   [provider]  levocetirizine (XYZAL) 5 MG tablet SMARTSIG:1 Tablet(s) By Mouth Every Evening 06/09/21   [provider]  lidocaine  (LIDODERM ) 5 % Place 1 patch onto the skin daily. Remove & Discard patch within 12 hours or as directed by MD  12/25/21   Darlis Eisenmenger, PA-C  naloxone [facility]) nasal spray 4 mg/0.1 mL SMARTSIG:1 Spray(s) Both Nares PRN 06/02/21   [provider]  oxyCODONE-acetaminophen  (PERCOCET) 7.5-325 MG tablet Take 1 tablet by mouth every 4 (four) hours as needed for severe pain.    [provider]  pantoprazole  (PROTONIX ) 40 MG tablet Take 40 mg by mouth daily.    [provider]  polyethylene glycol-electrolytes (NULYTELY ) 420 g solution See admin instructions. 05/02/21   [provider]  rosuvastatin (CRESTOR) 40 MG tablet Take 40 mg by mouth at bedtime. 05/11/21   [provider]  tamsulosin (FLOMAX) 0.4 MG CAPS capsule Take 0.4 mg by mouth daily. 06/09/21   [provider]    Family History Family History  Problem Relation Age of Onset   Uterine cancer Mother 22    Diabetes Father    Lung cancer Brother    Crohn's disease Son    Hypertension Other        uncle   Colon cancer Neg Hx    Colon polyps Neg Hx    Esophageal cancer Neg Hx    Rectal cancer Neg Hx    Stomach cancer Neg Hx     Social History Social History   Tobacco Use   Smoking status: Never   Smokeless tobacco: Never  Vaping Use   Vaping status: Never Used  Substance Use Topics   Alcohol use: Yes    Alcohol/week: 0.0 - 1.0 standard drinks of alcohol    Comment: Beer sometimes occasionally.   Drug use: No     Allergies   Patient has no known allergies.   Review of Systems Review of Systems  Constitutional: Negative.   HENT: Negative.    Respiratory: Negative.    Cardiovascular: Negative.   Gastrointestinal: Negative.   Musculoskeletal:  Positive for joint swelling.     Physical Exam Triage Vital Signs ED Triage Vitals [08/02/23 1013]  Encounter Vitals Group     BP 112/80     Systolic BP Percentile      Diastolic BP Percentile      Pulse Rate 89     Resp 16     Temp 98.1 F (36.7 C)     Temp Source Oral     SpO2 95 %     Weight      Height      Head Circumference      Peak Flow      Pain Score      Pain Loc      Pain Education      Exclude from Growth Chart    No data found.  Updated Vital Signs BP 112/80 (BP Location: Right Arm)   Pulse 89   Temp 98.1 F (36.7 C) (Oral)   Resp 16   SpO2 95%   Visual Acuity Right Eye Distance:   Left Eye Distance:   Bilateral Distance:    Right Eye Near:   Left Eye Near:    Bilateral Near:     Physical Exam Constitutional:      General: He is not in acute distress.    Appearance: Normal appearance. He is normal weight. He is not ill-appearing.  Cardiovascular:     Rate and Rhythm: Normal rate and regular rhythm.  Pulmonary:     Effort: Pulmonary effort is normal.     Breath sounds: Normal breath sounds.  Musculoskeletal:     Comments: He has swelling to the left medial ankle  and proximal  foot;  He is very TTP to the left medial ankle joint;  area is warm;  no erythema noted;  No other TTP noted to the foot;  no TTP to the calf/leg  Skin:    General: Skin is warm and dry.  Neurological:     General: No focal deficit present.     Mental Status: He is alert.  Psychiatric:        Mood and Affect: Mood normal.      UC Treatments / Results  Labs (all labs ordered are listed, but only abnormal results are displayed) Labs Reviewed - No data to display  EKG   Radiology No results found.  Procedures Procedures (including critical care time)  Medications Ordered in UC Medications - No data to display  Initial Impression / Assessment and Plan / UC Course  I have reviewed the triage vital signs and the nursing notes.  Pertinent labs & imaging results that were available during my care of the patient were reviewed by me and considered in my medical decision making (see chart for details).   Final Clinical Impressions(s) / UC Diagnoses   Final diagnoses:  Acute left ankle pain     Discharge Instructions      You were seen today for ankle pain.  Your xray appears normal today.  If the radiologist reads this differently we will notify you.  As discussed, I am suspicious for possible gout.  I have sent out an oral steroid to use for pain and swelling.  You may use tylenol  as well for pain.  If this does not improve or worsens then please go to the ER for further evaluation.   ED Prescriptions     Medication Sig Dispense Auth. Provider   predniSONE  (DELTASONE ) 20 MG tablet Take 2 tablets (40 mg total) by mouth daily for 5 days. 10 tablet Lesle Ras, MD      PDMP not reviewed this encounter.   Lesle Ras, MD 08/02/23 [phone]

## 2023-08-02 NOTE — ED Triage Notes (Signed)
 Patient c/o left foot swelling and pain since yesterday.

## 2023-10-03 ENCOUNTER — Ambulatory Visit (INDEPENDENT_AMBULATORY_CARE_PROVIDER_SITE_OTHER)

## 2023-10-03 ENCOUNTER — Ambulatory Visit (HOSPITAL_COMMUNITY)
Admission: EM | Admit: 2023-10-03 | Discharge: 2023-10-03 | Disposition: A | Discharge status: Home / Self Care | Attending: Internal Medicine | Admitting: Internal Medicine

## 2023-10-03 ENCOUNTER — Encounter (HOSPITAL_COMMUNITY): Payer: Self-pay

## 2023-10-03 DIAGNOSIS — I8390 Asymptomatic varicose veins of unspecified lower extremity: Secondary | ICD-10-CM

## 2023-10-03 DIAGNOSIS — M25472 Effusion, left ankle: Secondary | ICD-10-CM | POA: Diagnosis not present

## 2023-10-03 DIAGNOSIS — M25475 Effusion, left foot: Secondary | ICD-10-CM

## 2023-10-03 DIAGNOSIS — M25474 Effusion, right foot: Secondary | ICD-10-CM | POA: Diagnosis not present

## 2023-10-03 DIAGNOSIS — M25471 Effusion, right ankle: Secondary | ICD-10-CM

## 2023-10-03 MED ORDER — PREDNISONE 20 MG PO TABS: 40.0000 mg | ORAL_TABLET | Freq: Every day | ORAL | 0 refills | Status: AC

## 2023-10-03 NOTE — ED Triage Notes (Signed)
 Patient c/o bilateral foot and ankle swelling since yesterday.

## 2023-10-03 NOTE — ED Provider Notes (Signed)
 [facility]    CSN: [id number] Arrival date & time: 10/03/23  1235      History   Chief Complaint Chief Complaint  Patient presents with   Foot Swelling    HPI [name] [name] is a 68 y.o. male.   68 year old male who presents urgent care with complaints of bilateral foot and ankle swelling with left ankle pain.  He reports that this started yesterday but that he has persistent swelling on a regular basis.  His swelling will come and go.  It does worsen throughout the day.  He has significant varicose veins and has been seen by vascular surgery and is awaiting procedures to treat this.  He relates that his left ankle is very tender but denies any injury.  He has been seen for this in the past and reports that after using prednisone  his symptoms got much better.  He denies any history of gout.  He denies any shortness of breath, calf pain with walking, chest pain, open wounds, loss of sensation in the feet.     Past Medical History:  Diagnosis Date   Allergy    Anemia    not on medications   Arthritis    Asthma    Colon polyps    noted 10/2011 colonoscopy   Cough 02/14/2016   COVID    mild case   Diverticulosis    mild noted 10/2011   GERD (gastroesophageal reflux disease)    on meds   Hiatal hernia    small noted 10/2011   Hiatal hernia    small   HLD (hyperlipidemia)    not on meds   Hypertension    not on meds   Mild intermittent asthma 12/24/2017   Parasomnia    movement with sleep   Pyrosis    Seasonal allergies    Sleep apnea    NO CPAP   Varicose veins of both lower extremities with pain    right>left    Patient Active Problem List   Diagnosis Date Noted   Advice given about COVID-19 virus by telephone 11/07/2018   Erectile dysfunction due to diseases classified elsewhere 03/28/2018   Mild intermittent asthma 12/24/2017   Gait disturbance 07/19/2016   Seasonal allergies 01/07/2014   Obesity 11/19/2011   Health care maintenance  11/11/2011   GERD (gastroesophageal reflux disease) 08/15/2011   OSA (obstructive sleep apnea) 06/21/2011    Class: Chronic   Dyslipidemia 07/05/2009   Essential hypertension 07/05/2009   VARICOSE VEINS, LOWER EXTREMITIES 07/05/2009    Past Surgical History:  Procedure Laterality Date   COLONOSCOPY  03/27/2011   polyps   COLONOSCOPY  2017   DJ-MAC-suprep(adequate)-high risk adenoma   POLYPECTOMY         Home Medications    Prior to Admission medications   Medication Sig Start Date End Date Taking? Authorizing Provider  albuterol  (VENTOLIN  HFA) 108 (90 Base) MCG/ACT inhaler Inhale 2 puffs into the lungs every 6 (six) hours as needed for wheezing or shortness of breath. 11/06/22   Hope Almarie ORN, NP  amLODipine (NORVASC) 5 MG tablet Take 5 mg by mouth daily.    [provider]  diclofenac  Sodium (VOLTAREN ) 1 % GEL Apply 2 g topically 4 (four) times daily. 12/25/21   Beverley Leita LABOR, PA-C  etodolac (LODINE) 300 MG capsule Take 300 mg by mouth daily. 05/11/21   [provider]  ezetimibe (ZETIA) 10 MG tablet Take 10 mg by mouth daily. 06/14/21   [provider]  hydrochlorothiazide  (HYDRODIURIL ) 25 MG tablet Take 25 mg by mouth every morning. 06/09/21   [provider]  levocetirizine (XYZAL) 5 MG tablet SMARTSIG:1 Tablet(s) By Mouth Every Evening 06/09/21   [provider]  lidocaine  (LIDODERM ) 5 % Place 1 patch onto the skin daily. Remove & Discard patch within 12 hours or as directed by MD 12/25/21   Beverley Leita LABOR, PA-C  naloxone [facility]) nasal spray 4 mg/0.1 mL SMARTSIG:1 Spray(s) Both Nares PRN 06/02/21   [provider]  pantoprazole  (PROTONIX ) 40 MG tablet Take 40 mg by mouth daily.    [provider]  rosuvastatin (CRESTOR) 40 MG tablet Take 40 mg by mouth at bedtime. 05/11/21   [provider]  tamsulosin (FLOMAX) 0.4 MG CAPS capsule Take 0.4 mg by mouth daily. 06/09/21   [provider]    Family  History Family History  Problem Relation Age of Onset   Uterine cancer Mother 57   Diabetes Father    Lung cancer Brother    Crohn's disease Son    Hypertension Other        uncle   Colon cancer Neg Hx    Colon polyps Neg Hx    Esophageal cancer Neg Hx    Rectal cancer Neg Hx    Stomach cancer Neg Hx     Social History Social History   Tobacco Use   Smoking status: Never   Smokeless tobacco: Never  Vaping Use   Vaping status: Never Used  Substance Use Topics   Alcohol use: Yes    Alcohol/week: 0.0 - 1.0 standard drinks of alcohol    Comment: Beer sometimes occasionally.   Drug use: No     Allergies   Patient has no known allergies.   Review of Systems Review of Systems  Constitutional:  Negative for chills and fever.  HENT:  Negative for ear pain and sore throat.   Eyes:  Negative for pain and visual disturbance.  Respiratory:  Negative for cough, chest tightness and shortness of breath.   Cardiovascular:  Positive for leg swelling. Negative for chest pain and palpitations.  Gastrointestinal:  Negative for abdominal pain and vomiting.  Genitourinary:  Negative for dysuria and hematuria.  Musculoskeletal:  Negative for arthralgias and back pain.       Bilateral ankle and foot swelling with left ankle pain  Skin:  Negative for color change and rash.  Neurological:  Negative for seizures and syncope.  All other systems reviewed and are negative.    Physical Exam Triage Vital Signs ED Triage Vitals [10/03/23 1328]  Encounter Vitals Group     BP 128/87     Girls Systolic BP Percentile      Girls Diastolic BP Percentile      Boys Systolic BP Percentile      Boys Diastolic BP Percentile      Pulse Rate 82     Resp 16     Temp 98.1 F (36.7 C)     Temp Source Oral     SpO2 97 %     Weight      Height      Head Circumference      Peak Flow      Pain Score 9     Pain Loc      Pain Education      Exclude from Growth Chart    No data found.  Updated  Vital Signs BP 128/87 (BP Location: Left Arm)   Pulse  82   Temp 98.1 F (36.7 C) (Oral)   Resp 16   SpO2 97%   Visual Acuity Right Eye Distance:   Left Eye Distance:   Bilateral Distance:    Right Eye Near:   Left Eye Near:    Bilateral Near:     Physical Exam Vitals and nursing note reviewed.  Constitutional:      General: He is not in acute distress.    Appearance: He is well-developed.  HENT:     Head: Normocephalic and atraumatic.  Eyes:     Conjunctiva/sclera: Conjunctivae normal.  Cardiovascular:     Rate and Rhythm: Normal rate and regular rhythm.     Pulses:          Dorsalis pedis pulses are 2+ on the right side and 2+ on the left side.       Posterior tibial pulses are 2+ on the right side and 2+ on the left side.     Heart sounds: No murmur heard. Pulmonary:     Effort: Pulmonary effort is normal. No respiratory distress.     Breath sounds: Normal breath sounds.  Abdominal:     Palpations: Abdomen is soft.     Tenderness: There is no abdominal tenderness.  Musculoskeletal:        General: No swelling.     Cervical back: Neck supple.     Right foot: Normal range of motion.     Left foot: Normal range of motion.       Legs:     Comments: Bilateral ankles and feet with similar edema.  The edema does not extend into the lower leg.  Chronic changes are seen bilateral for venous stasis with the right much worse than the left  Feet:     Right foot:     Skin integrity: Skin integrity normal.     Left foot:     Skin integrity: Skin integrity normal.  Skin:    General: Skin is warm and dry.     Capillary Refill: Capillary refill takes less than 2 seconds.  Neurological:     Mental Status: He is alert.  Psychiatric:        Mood and Affect: Mood normal.      UC Treatments / Results  Labs (all labs ordered are listed, but only abnormal results are displayed) Labs Reviewed - No data to display  EKG   Radiology No results  found.  Procedures Procedures (including critical care time)  Medications Ordered in UC Medications - No data to display  Initial Impression / Assessment and Plan / UC Course  I have reviewed the triage vital signs and the nursing notes.  Pertinent labs & imaging results that were available during my care of the patient were reviewed by me and considered in my medical decision making (see chart for details).     Bilateral swelling of feet and ankles - Plan: DG Ankle Complete Left, DG Ankle Complete Right, DG Ankle Complete Left, DG Ankle Complete Right  VARICOSE VEINS, LOWER EXTREMITIES   X-ray done today of the ankles bilateral.  Final evaluation by the radiologist is still pending but on brief evaluation there does not appear to be any acute changes.  If there is any difference after the radiologist finalizes the report, we will contact you.  Swelling is likely secondary to chronic venous insufficiency with possible acute inflammation on the left ankle.  This could be secondary to gout although the clinical presentation seems less  likely.  As this responded well to prednisone  previously, we will treat with this again today. Prednisone  40 mg (2 tablets) once daily for 5 days. Take this in the morning.  This is a steroid to help with inflammation and pain.  Recommend wearing compression stockings to help with swelling, wear these during the day and remove them at night. Recommend elevating your legs during the day to help with swelling Return to urgent care or PCP if symptoms worsen or fail to resolve.   Final Clinical Impressions(s) / UC Diagnoses   Final diagnoses:  Bilateral swelling of feet and ankles  VARICOSE VEINS, LOWER EXTREMITIES     Discharge Instructions      X-ray done today of the ankles bilateral.  Final evaluation by the radiologist is still pending but on brief evaluation there does not appear to be any acute changes.  If there is any difference after the  radiologist finalizes the report, we will contact you.  Swelling is likely secondary to chronic venous insufficiency with possible acute inflammation on the left ankle.  This could be secondary to gout although the clinical presentation seems less likely.  As this responded well to prednisone  previously, we will treat with this again today. Prednisone  40 mg (2 tablets) once daily for 5 days. Take this in the morning.  This is a steroid to help with inflammation and pain.  Recommend wearing compression stockings to help with swelling, wear these during the day and remove them at night. Recommend elevating your legs during the day to help with swelling Return to urgent care or PCP if symptoms worsen or fail to resolve.       ED Prescriptions   None    PDMP not reviewed this encounter.   Teresa Almarie LABOR, [place] 10/03/23 1437

## 2023-10-03 NOTE — Discharge Instructions (Signed)
 X-ray done today of the ankles bilateral.  Final evaluation by the radiologist is still pending but on brief evaluation there does not appear to be any acute changes.  If there is any difference after the radiologist finalizes the report, we will contact you.  Swelling is likely secondary to chronic venous insufficiency with possible acute inflammation on the left ankle.  This could be secondary to gout although the clinical presentation seems less likely.  As this responded well to prednisone  previously, we will treat with this again today. Prednisone  40 mg (2 tablets) once daily for 5 days. Take this in the morning.  This is a steroid to help with inflammation and pain.  Recommend wearing compression stockings to help with swelling, wear these during the day and remove them at night. Recommend elevating your legs during the day to help with swelling Return to urgent care or PCP if symptoms worsen or fail to resolve.

## 2023-12-20 ENCOUNTER — Emergency Department (HOSPITAL_COMMUNITY)

## 2023-12-20 ENCOUNTER — Other Ambulatory Visit: Payer: Self-pay

## 2023-12-20 ENCOUNTER — Emergency Department (HOSPITAL_COMMUNITY)
Admission: EM | Admit: 2023-12-20 | Discharge: 2023-12-20 | Disposition: A | Discharge status: Home / Self Care | Attending: Emergency Medicine | Admitting: Emergency Medicine

## 2023-12-20 DIAGNOSIS — I10 Essential (primary) hypertension: Secondary | ICD-10-CM | POA: Insufficient documentation

## 2023-12-20 DIAGNOSIS — Z79899 Other long term (current) drug therapy: Secondary | ICD-10-CM | POA: Insufficient documentation

## 2023-12-20 DIAGNOSIS — M25571 Pain in right ankle and joints of right foot: Secondary | ICD-10-CM | POA: Insufficient documentation

## 2023-12-20 NOTE — Discharge Instructions (Signed)
 Your x-rays this morning were reassuring.  You may take over-the-counter medication for pain control.  Please use the compressive bandage if you find it beneficial.  While sitting still I recommend elevating the ankle and using intermittent icing.  Follow-up as needed with your primary care provider.
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
## 2024-03-24 ENCOUNTER — Other Ambulatory Visit: Payer: Self-pay

## 2024-03-24 ENCOUNTER — Encounter (HOSPITAL_COMMUNITY): Payer: Self-pay

## 2024-03-24 ENCOUNTER — Emergency Department (HOSPITAL_COMMUNITY)
Admission: EM | Admit: 2024-03-24 | Discharge: 2024-03-25 | Disposition: A | Discharge status: Home / Self Care | Source: Ambulatory Visit | Attending: Emergency Medicine | Admitting: Emergency Medicine

## 2024-03-24 ENCOUNTER — Emergency Department (HOSPITAL_COMMUNITY)

## 2024-03-24 ENCOUNTER — Ambulatory Visit (HOSPITAL_COMMUNITY)
Admission: EM | Admit: 2024-03-24 | Discharge: 2024-03-24 | Disposition: A | Discharge status: Home / Self Care | Attending: Family Medicine | Admitting: Family Medicine

## 2024-03-24 DIAGNOSIS — R0689 Other abnormalities of breathing: Secondary | ICD-10-CM | POA: Insufficient documentation

## 2024-03-24 DIAGNOSIS — R072 Precordial pain: Secondary | ICD-10-CM | POA: Diagnosis not present

## 2024-03-24 DIAGNOSIS — Z8616 Personal history of COVID-19: Secondary | ICD-10-CM | POA: Insufficient documentation

## 2024-03-24 DIAGNOSIS — Z79899 Other long term (current) drug therapy: Secondary | ICD-10-CM | POA: Insufficient documentation

## 2024-03-24 DIAGNOSIS — J45909 Unspecified asthma, uncomplicated: Secondary | ICD-10-CM | POA: Insufficient documentation

## 2024-03-24 DIAGNOSIS — R079 Chest pain, unspecified: Secondary | ICD-10-CM | POA: Diagnosis present

## 2024-03-24 DIAGNOSIS — I10 Essential (primary) hypertension: Secondary | ICD-10-CM | POA: Diagnosis not present

## 2024-03-24 LAB — BASIC METABOLIC PANEL WITH GFR
Anion gap: 9 (ref 5–15)
BUN: 15 mg/dL (ref 8–23)
CO2: 29 mmol/L (ref 22–32)
Calcium: 9.7 mg/dL (ref 8.9–10.3)
Chloride: 102 mmol/L (ref 98–111)
Creatinine, Ser: 0.92 mg/dL (ref 0.61–1.24)
GFR, Estimated: >60 mL/min
Glucose, Bld: 116 mg/dL — ABNORMAL HIGH (ref 70–99)
Potassium: 4 mmol/L (ref 3.5–5.1)
Sodium: 140 mmol/L (ref 135–145)

## 2024-03-24 LAB — CBC
HCT: 41.8 % (ref 39.0–52.0)
Hemoglobin: 13.5 g/dL (ref 13.0–17.0)
MCH: 30.3 pg (ref 26.0–34.0)
MCHC: 32.3 g/dL (ref 30.0–36.0)
MCV: 93.9 fL (ref 80.0–100.0)
Platelets: 196 K/uL (ref 150–400)
RBC: 4.45 MIL/uL (ref 4.22–5.81)
RDW: 13.4 % (ref 11.5–15.5)
WBC: 4.9 K/uL (ref 4.0–10.5)
nRBC: 0 % (ref 0.0–0.2)

## 2024-03-24 LAB — TROPONIN T, HIGH SENSITIVITY: Troponin T High Sensitivity: <15 ng/L (ref 0–19)

## 2024-03-24 LAB — PRO BRAIN NATRIURETIC PEPTIDE: Pro Brain Natriuretic Peptide: 251 pg/mL

## 2024-03-24 MED ORDER — ASPIRIN 81 MG PO CHEW
324.0000 mg | CHEWABLE_TABLET | Freq: Once | ORAL | Status: AC
  Administered 2024-03-24: 324 mg via ORAL
  Filled 2024-03-24: qty 4

## 2024-03-24 NOTE — ED Provider Triage Note (Signed)
 Emergency Medicine Provider Triage Evaluation Note  SUJAY GRUNDMAN , a 68 y.o. male  was evaluated in triage.  Pt complains of left-sided chest pain, sent to ED by urgent care.  Denies shortness of breath, denies radiation of pain.  Pain increases with activity, also with forward leaning.  History of hypertension, hiatal hernia, hyperlipidemia.  Not taking medications for hypertension today.  Review of Systems  Positive: As above Negative:   Physical Exam  BP (!) 182/128   Pulse 74   Temp 97.9 F (36.6 C) (Oral)   Resp 20   SpO2 98%  Gen:   Awake, no distress   Resp:  Normal effort  MSK:   Moves extremities without difficulty  Other:    Medical Decision Making  Medically screening exam initiated at 8:41 PM.  Appropriate orders placed.  Akil B Denker was informed that the remainder of the evaluation will be completed by another provider, this initial triage assessment does not replace that evaluation, and the importance of remaining in the ED until their evaluation is complete.  Initial chest pain order set placed   Myriam Dorn BROCKS, [place] 03/24/24 2049

## 2024-03-24 NOTE — ED Notes (Signed)
 Patient is being discharged from the Urgent Care and sent to the Emergency Department via POV . Per Dr. Vonna, patient is in need of higher level of care due to chest pain and elevated BP. Patient is aware and verbalizes understanding of plan of care.  Vitals:   03/24/24 1816  BP: (!) 163/105  Pulse: 69  Temp: 97.7 F (36.5 C)  SpO2: 96%

## 2024-03-24 NOTE — Discharge Instructions (Signed)
 Please go to the emergency room for further evaluation.

## 2024-03-24 NOTE — ED Triage Notes (Signed)
 Pt. Arrives from urgent care for chest pain. States that he has been lifting bodies all day. Pain started today. Denies SOB. Hypertensive in triage worsening pain with movement. Denies cough, nausea and vomiting.

## 2024-03-24 NOTE — ED Triage Notes (Addendum)
 Pt c/o of chest tightness that started today. 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 Pt denies any cough and SOB. Pt denies any weakness, numbness, or tingling. Pt states that it may be indigestion or muscular due to lifting.

## 2024-03-24 NOTE — ED Provider Notes (Signed)
 " [facility]    CSN: [id number] Arrival date & time: 03/24/24  1701      History   Chief Complaint Chief Complaint  Patient presents with   Chest Pain    HPI [name] [name] is a 68 y.o. male.    Chest Pain  Here for left-sided chest pain and tightness.  It feels like a pressure or like somebody is sitting on him.  There are no exacerbating factors that he notes.  It began about 6 hours ago.  No nausea vomiting or diaphoresis.  No fever or cough  He did have to do a bunch of lifting today before this started.  NKDA    Past Medical History:  Diagnosis Date   Allergy    Anemia    not on medications   Arthritis    Asthma    Colon polyps    noted 10/2011 colonoscopy   Cough 02/14/2016   COVID    mild case   Diverticulosis    mild noted 10/2011   GERD (gastroesophageal reflux disease)    on meds   Hiatal hernia    small noted 10/2011   Hiatal hernia    small   HLD (hyperlipidemia)    not on meds   Hypertension    not on meds   Mild intermittent asthma 12/24/2017   Parasomnia    movement with sleep   Pyrosis    Seasonal allergies    Sleep apnea    NO CPAP   Varicose veins of both lower extremities with pain    right>left    Patient Active Problem List   Diagnosis Date Noted   Advice given about COVID-19 virus by telephone 11/07/2018   Erectile dysfunction due to diseases classified elsewhere 03/28/2018   Mild intermittent asthma 12/24/2017   Gait disturbance 07/19/2016   Seasonal allergies 01/07/2014   Obesity 11/19/2011   Health care maintenance 11/11/2011   GERD (gastroesophageal reflux disease) 08/15/2011   OSA (obstructive sleep apnea) 06/21/2011    Class: Chronic   Dyslipidemia 07/05/2009   Essential hypertension 07/05/2009   VARICOSE VEINS, LOWER EXTREMITIES 07/05/2009    Past Surgical History:  Procedure Laterality Date   COLONOSCOPY  03/27/2011   polyps   COLONOSCOPY  2017   DJ-MAC-suprep(adequate)-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 Authorizing Provider  albuterol  (VENTOLIN  HFA) 108 (90 Base) MCG/ACT inhaler Inhale 2 puffs into the lungs every 6 (six) hours as needed for wheezing or shortness of breath. 11/06/22  Yes Hope Almarie ORN, NP  amLODipine (NORVASC) 5 MG tablet Take 5 mg by mouth daily.   Yes [provider]  diclofenac  Sodium (VOLTAREN ) 1 % GEL Apply 2 g topically 4 (four) times daily. 12/25/21   Beverley Leita LABOR, PA-C  etodolac (LODINE) 300 MG capsule Take 300 mg by mouth daily. 05/11/21   [provider]  ezetimibe (ZETIA) 10 MG tablet Take 10 mg by mouth daily. 06/14/21   [provider]  hydrochlorothiazide  (HYDRODIURIL ) 25 MG tablet Take 25 mg by mouth every morning. 06/09/21   [provider]  levocetirizine (XYZAL) 5 MG tablet SMARTSIG:1 Tablet(s) By Mouth Every Evening 06/09/21   [provider]  lidocaine  (LIDODERM ) 5 % Place 1 patch onto the skin daily. Remove & Discard patch within 12 hours or as directed by MD 12/25/21   Beverley,  Leita LABOR, PA-C  naloxone [facility]) nasal spray 4 mg/0.1 mL SMARTSIG:1 Spray(s) Both Nares PRN 06/02/21   [provider]  pantoprazole  (PROTONIX ) 40 MG tablet Take 40 mg by mouth daily.    [provider]  rosuvastatin (CRESTOR) 40 MG tablet Take 40 mg by mouth at bedtime. 05/11/21   [provider]  tamsulosin (FLOMAX) 0.4 MG CAPS capsule Take 0.4 mg by mouth daily. 06/09/21   [provider]    Family History Family History  Problem Relation Age of Onset   Uterine cancer Mother 10   Diabetes Father    Lung cancer Brother    Crohn's disease Son    Hypertension Other        uncle   Colon cancer Neg Hx    Colon polyps Neg Hx    Esophageal cancer Neg Hx    Rectal cancer Neg Hx    Stomach cancer Neg Hx     Social History Social History[1]   Allergies   Patient has  no known allergies.   Review of Systems Review of Systems  Cardiovascular:  Positive for chest pain.     Physical Exam Triage Vital Signs ED Triage Vitals  Encounter Vitals Group     BP 03/24/24 1816 (!) 163/105     Girls Systolic BP Percentile --      Girls Diastolic BP Percentile --      Boys Systolic BP Percentile --      Boys Diastolic BP Percentile --      Pulse Rate 03/24/24 1816 69     Resp --      Temp 03/24/24 1816 97.7 F (36.5 C)     Temp Source 03/24/24 1816 Oral     SpO2 03/24/24 1816 96 %     Weight --      Height --      Head Circumference --      Peak Flow --      Pain Score 03/24/24 1813 5     Pain Loc --      Pain Education --      Exclude from Growth Chart --    No data found.  Updated Vital Signs BP (!) 163/105 (BP Location: Left Arm)   Pulse 69   Temp 97.7 F (36.5 C) (Oral)   SpO2 96%   Visual Acuity Right Eye Distance:   Left Eye Distance:   Bilateral Distance:    Right Eye Near:   Left Eye Near:    Bilateral Near:     Physical Exam Vitals reviewed.  Constitutional:      General: He is not in acute distress.    Appearance: He is not ill-appearing, toxic-appearing or diaphoretic.  HENT:     Mouth/Throat:     Mouth: Mucous membranes are moist.     Pharynx: No oropharyngeal exudate or posterior oropharyngeal erythema.  Cardiovascular:     Rate and Rhythm: Normal rate and regular rhythm.     Heart sounds: No murmur heard. Pulmonary:     Effort: Pulmonary effort is normal.     Breath sounds: Normal breath sounds.  Chest:     Chest wall: No tenderness.  Skin:    Capillary Refill: Capillary refill takes less than 2 seconds.     Coloration: Skin is not jaundiced or pale.  Neurological:     General: No focal deficit present.     Mental Status: He is alert and oriented to person, place, and time.  Psychiatric:  Behavior: Behavior normal.      UC Treatments / Results  Labs (all labs ordered are listed, but only  abnormal results are displayed) Labs Reviewed - No data to display  EKG   Radiology No results found.  Procedures Procedures (including critical care time)  Medications Ordered in UC Medications - No data to display  Initial Impression / Assessment and Plan / UC Course  I have reviewed the triage vital signs and the nursing notes.  Pertinent labs & imaging results that were available during my care of the patient were reviewed by me and considered in my medical decision making (see chart for details).     EKG shows normal sinus rhythm.  No definite ST segment changes, there is upsloping in the V6 ST segment.  No elevation or depression noted.  I think his symptoms warrant evaluation in the emergency room to rule out cardiac causes of his chest pain.  He will go by private vehicle.  Vital signs are overall reassuring though his blood pressure is elevated and he should be safe to self transport to the emergency room Final Clinical Impressions(s) / UC Diagnoses   Final diagnoses:  None   Discharge Instructions   None    ED Prescriptions   None    PDMP not reviewed this encounter.     [1]  Social History Tobacco Use   Smoking status: Never   Smokeless tobacco: Never  Vaping Use   Vaping status: Never Used  Substance Use Topics   Alcohol use: Yes    Alcohol/week: 0.0 - 1.0 standard drinks of alcohol    Comment: Beer sometimes occasionally.   Drug use: No     Vonna Sharlet POUR, MD 03/24/24 1840  "

## 2024-03-25 LAB — TROPONIN T, HIGH SENSITIVITY: Troponin T High Sensitivity: <15 ng/L (ref 0–19)

## 2024-03-25 NOTE — Discharge Instructions (Signed)

## 2024-03-25 NOTE — ED Provider Notes (Signed)
 " [place] [facility] AT [facility] Provider Note   CSN: [id number] Arrival date & time: 03/24/24  1949     Patient presents with: Chest Pain   [name] [name] is a 68 y.o. male.   The history is provided by the patient.  Patient with history of hypertension presents with chest pain.  Patient reports this started at work.  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  He reports after heavy lifting he started having pain and tightness in his chest.  He reports it is worse with movement and palpation, but is now resolved spontaneously Lasted for several hours.  He was seen at an urgent care and sent for further evaluation  Denies any fevers or vomiting.  No diaphoresis, no shortness of breath or weakness.  No previous history of CAD/VTE    Past Medical History:  Diagnosis Date   Allergy    Anemia    not on medications   Arthritis    Asthma    Colon polyps    noted 10/2011 colonoscopy   Cough 02/14/2016   COVID    mild case   Diverticulosis    mild noted 10/2011   GERD (gastroesophageal reflux disease)    on meds   Hiatal hernia    small noted 10/2011   Hiatal hernia    small   HLD (hyperlipidemia)    not on meds   Hypertension    not on meds   Mild intermittent asthma 12/24/2017   Parasomnia    movement with sleep   Pyrosis    Seasonal allergies    Sleep apnea    NO CPAP   Varicose veins of both lower extremities with pain    right>left    Prior to Admission medications  Medication Sig Start Date End Date Taking? Authorizing Provider  albuterol  (VENTOLIN  HFA) 108 (90 Base) MCG/ACT inhaler Inhale 2 puffs into the lungs every 6 (six) hours as needed for wheezing or shortness of breath. 11/06/22   Hope Almarie ORN, NP  amLODipine (NORVASC) 5 MG tablet Take 5 mg by mouth daily.    [provider]  diclofenac  Sodium (VOLTAREN ) 1 % GEL Apply 2 g topically 4 (four) times daily. 12/25/21   Beverley Leita LABOR, PA-C   etodolac (LODINE) 300 MG capsule Take 300 mg by mouth daily. 05/11/21   [provider]  ezetimibe (ZETIA) 10 MG tablet Take 10 mg by mouth daily. 06/14/21   [provider]  hydrochlorothiazide  (HYDRODIURIL ) 25 MG tablet Take 25 mg by mouth every morning. 06/09/21   [provider]  levocetirizine (XYZAL) 5 MG tablet SMARTSIG:1 Tablet(s) By Mouth Every Evening 06/09/21   [provider]  lidocaine  (LIDODERM ) 5 % Place 1 patch onto the skin daily. Remove & Discard patch within 12 hours or as directed by MD 12/25/21   Beverley Leita LABOR, PA-C  naloxone [facility]) nasal spray 4 mg/0.1 mL SMARTSIG:1 Spray(s) Both Nares PRN 06/02/21   [provider]  pantoprazole  (PROTONIX ) 40 MG tablet Take 40 mg by mouth daily.    [provider]  rosuvastatin (CRESTOR) 40 MG tablet Take 40 mg by mouth at bedtime. 05/11/21   [provider]  tamsulosin (FLOMAX) 0.4 MG CAPS capsule Take 0.4 mg by mouth daily. 06/09/21   [provider]    Allergies: Patient has no known allergies.    Review of Systems  Constitutional:  Negative for diaphoresis and fever.  Respiratory:  Negative for shortness of breath.   Cardiovascular:  Positive for chest pain.  Gastrointestinal:  Negative for vomiting.    Updated Vital Signs BP (!) 155/100 (BP Location: Right Arm)   Pulse 67   Temp 97.8 F (36.6 C) (Oral)   Resp 16   SpO2 96%   Physical Exam CONSTITUTIONAL: Well developed/well nourished, no distress watching television HEAD: Normocephalic/atraumatic ENMT: Mucous membranes moist NECK: supple no meningeal signs CV: S1/S2 noted, no murmurs/rubs/gallops noted Chest-no tenderness or crepitance LUNGS: Lungs are clear to auscultation bilaterally, no apparent distress ABDOMEN: soft, nontender, no rebound or guarding, bowel sounds noted throughout abdomen GU:no cva tenderness NEURO: Pt is awake/alert/appropriate, moves all extremitiesx4.  No facial droop.    EXTREMITIES: pulses normal/equal, full ROM, chronic symmetric edema in bilateral extremities No calf tenderness SKIN: warm, color normal PSYCH: no abnormalities of mood noted, alert and oriented to situation  (all labs ordered are listed, but only abnormal results are displayed) Labs Reviewed  BASIC METABOLIC PANEL WITH GFR - Abnormal; Notable for the following components:      Result Value   Glucose, Bld 116 (*)    All other components within normal limits  CBC  PRO BRAIN NATRIURETIC PEPTIDE  TROPONIN T, HIGH SENSITIVITY  TROPONIN T, HIGH SENSITIVITY    EKG: EKG Interpretation Date/Time:  Tuesday March 24 2024 21:04:49 EST Ventricular Rate:  77 PR Interval:  188 QRS Duration:  95 QT Interval:  409 QTC Calculation: 463 R Axis:   27  Text Interpretation: Sinus rhythm Atrial premature complexes RSR' in V1 or V2, right VCD or RVH Confirmed by Midge Golas ([id number]) on 03/25/2024 12:28:20 AM  Radiology: DG Chest 2 View Result Date: 03/24/2024 EXAM: 2 VIEW(S) XRAY OF THE CHEST 03/24/2024 09:00:00 PM COMPARISON: None available. CLINICAL HISTORY: chest pain FINDINGS: LUNGS AND PLEURA: No focal pulmonary opacity. No pleural effusion. No pneumothorax. HEART AND MEDIASTINUM: Mild cardiomegaly. BONES AND SOFT TISSUES: No acute osseous abnormality. IMPRESSION: 1. No acute findings. Electronically signed by: Vanetta Chou MD 03/24/2024 09:32 PM EST RP Workstation: [id number]     Procedures   Medications Ordered in the ED  aspirin  chewable tablet 324 mg (324 mg Oral Given 03/24/24 2110)                HEART Score: 4                    Medical Decision Making  This patient presents to the ED for concern of chest pain, this involves an extensive number of treatment options, and is a complaint that carries with it a high risk of complications and morbidity.  The differential diagnosis includes but is not limited to acute coronary syndrome, aortic dissection, pulmonary  embolism, pericarditis, pneumothorax, pneumonia, myocarditis, pleurisy, esophageal rupture   Comorbidities that complicate the patient evaluation: Patients presentation is complicated by their history of hypertension  Social Determinants of Health: Patients poor health literacy  increases the complexity of managing their presentation  Additional history obtained: Records reviewed urgent care notes  Lab Tests: I Ordered, and personally interpreted labs.  The pertinent results include: Labs overall unremarkable  Imaging Studies ordered: I ordered imaging studies including X-ray chest  I independently visualized and interpreted imaging which showed cardiomegaly, no other acute findings I agree with the radiologist interpretation  Test Considered: Patient heart score 4, but low suspicion for ACS given history and exam, will defer further workup and discharge home.  Low suspicion for PE or dissection at this  time  Reevaluation: After the interventions noted above, I reevaluated the patient and found that they have :improved  Complexity of problems addressed: Patients presentation is most consistent with  acute presentation with potential threat to life or bodily function  Disposition: After consideration of the diagnostic results and the patients response to treatment,  I feel that the patent would benefit from discharge  .        Final diagnoses:  Precordial pain    ED Discharge Orders     None          Midge Golas, MD 03/25/24 0300  "
